# Patient Record
Sex: Female | Born: 1945
Health system: Southern US, Community
[De-identification: ages and names within clinical notes are randomized; demographics above are authoritative.]

## PROBLEM LIST (undated history)

## (undated) DIAGNOSIS — J309 Allergic rhinitis, unspecified: Secondary | ICD-10-CM

## (undated) DIAGNOSIS — K5792 Diverticulitis of intestine, part unspecified, without perforation or abscess without bleeding: Secondary | ICD-10-CM

## (undated) DIAGNOSIS — E785 Hyperlipidemia, unspecified: Secondary | ICD-10-CM

## (undated) DIAGNOSIS — D801 Nonfamilial hypogammaglobulinemia: Secondary | ICD-10-CM

## (undated) DIAGNOSIS — E669 Obesity, unspecified: Secondary | ICD-10-CM

## (undated) DIAGNOSIS — C9111 Chronic lymphocytic leukemia of B-cell type in remission: Secondary | ICD-10-CM

## (undated) DIAGNOSIS — K219 Gastro-esophageal reflux disease without esophagitis: Secondary | ICD-10-CM

## (undated) DIAGNOSIS — R768 Other specified abnormal immunological findings in serum: Secondary | ICD-10-CM

## (undated) DIAGNOSIS — C801 Malignant (primary) neoplasm, unspecified: Secondary | ICD-10-CM

## (undated) DIAGNOSIS — K625 Hemorrhage of anus and rectum: Secondary | ICD-10-CM

## (undated) DIAGNOSIS — D649 Anemia, unspecified: Secondary | ICD-10-CM

## (undated) DIAGNOSIS — K635 Polyp of colon: Secondary | ICD-10-CM

## (undated) DIAGNOSIS — H269 Unspecified cataract: Secondary | ICD-10-CM

## (undated) DIAGNOSIS — E079 Disorder of thyroid, unspecified: Secondary | ICD-10-CM

## (undated) DIAGNOSIS — I10 Essential (primary) hypertension: Secondary | ICD-10-CM

## (undated) DIAGNOSIS — C4490 Unspecified malignant neoplasm of skin, unspecified: Secondary | ICD-10-CM

## (undated) HISTORY — DX: Hyperlipidemia, unspecified: E78.5

## (undated) HISTORY — DX: Other specified abnormal immunological findings in serum: R76.8

## (undated) HISTORY — DX: Anemia, unspecified: D64.9

## (undated) HISTORY — DX: Allergic rhinitis, unspecified: J30.9

## (undated) HISTORY — PX: TONSILLECTOMY: SUR1361

## (undated) HISTORY — DX: Unspecified cataract: H26.9

## (undated) HISTORY — DX: Essential (primary) hypertension: I10

## (undated) HISTORY — PX: SIGMOIDOSCOPY: SUR1295

## (undated) HISTORY — DX: Malignant (primary) neoplasm, unspecified: C80.1

## (undated) HISTORY — DX: Gastro-esophageal reflux disease without esophagitis: K21.9

## (undated) HISTORY — DX: Obesity, unspecified: E66.9

## (undated) HISTORY — DX: Unspecified malignant neoplasm of skin, unspecified: C44.90

## (undated) HISTORY — DX: Disorder of thyroid, unspecified: E07.9

## (undated) HISTORY — DX: Chronic lymphocytic leukemia of B-cell type in remission: C91.11

## (undated) HISTORY — PX: BUNIONECTOMY: SHX129

## (undated) HISTORY — PX: SKIN BIOPSY: SHX1

## (undated) HISTORY — DX: Polyp of colon: K63.5

## (undated) HISTORY — DX: Diverticulitis of intestine, part unspecified, without perforation or abscess without bleeding: K57.92

## (undated) HISTORY — DX: Hemorrhage of anus and rectum: K62.5

## (undated) HISTORY — DX: Nonfamilial hypogammaglobulinemia: D80.1

---
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
## 1999-04-04 ENCOUNTER — Ambulatory Visit (HOSPITAL_COMMUNITY): Admission: RE | Admit: 1999-04-04 | Discharge: 1999-04-04 | Payer: Self-pay | Admitting: Internal Medicine

## 1999-04-04 ENCOUNTER — Encounter: Payer: Self-pay | Admitting: Internal Medicine

## 1999-06-20 ENCOUNTER — Ambulatory Visit (HOSPITAL_COMMUNITY): Admission: RE | Admit: 1999-06-20 | Discharge: 1999-06-20 | Payer: Self-pay | Admitting: Gastroenterology

## 2000-03-04 ENCOUNTER — Encounter: Payer: Self-pay | Admitting: Obstetrics and Gynecology

## 2000-03-04 ENCOUNTER — Encounter: Admission: RE | Admit: 2000-03-04 | Discharge: 2000-03-04 | Payer: Self-pay | Admitting: Obstetrics and Gynecology

## 2001-02-23 ENCOUNTER — Other Ambulatory Visit: Admission: RE | Admit: 2001-02-23 | Discharge: 2001-02-23 | Payer: Self-pay | Admitting: Oncology

## 2001-03-04 ENCOUNTER — Encounter (INDEPENDENT_AMBULATORY_CARE_PROVIDER_SITE_OTHER): Payer: Self-pay | Admitting: Specialist

## 2001-03-04 ENCOUNTER — Other Ambulatory Visit: Admission: RE | Admit: 2001-03-04 | Discharge: 2001-03-04 | Payer: Self-pay | Admitting: Oncology

## 2001-04-08 ENCOUNTER — Encounter: Payer: Self-pay | Admitting: Obstetrics and Gynecology

## 2001-04-08 ENCOUNTER — Encounter: Admission: RE | Admit: 2001-04-08 | Discharge: 2001-04-08 | Payer: Self-pay | Admitting: Obstetrics and Gynecology

## 2002-06-08 ENCOUNTER — Encounter (HOSPITAL_COMMUNITY): Admission: RE | Admit: 2002-06-08 | Discharge: 2002-09-06 | Payer: Self-pay | Admitting: Internal Medicine

## 2002-07-04 ENCOUNTER — Encounter: Payer: Self-pay | Admitting: Oncology

## 2002-07-04 ENCOUNTER — Ambulatory Visit (HOSPITAL_COMMUNITY): Admission: RE | Admit: 2002-07-04 | Discharge: 2002-07-04 | Payer: Self-pay | Admitting: Oncology

## 2002-09-05 ENCOUNTER — Encounter: Payer: Self-pay | Admitting: Oncology

## 2002-09-05 ENCOUNTER — Ambulatory Visit (HOSPITAL_COMMUNITY): Admission: RE | Admit: 2002-09-05 | Discharge: 2002-09-05 | Payer: Self-pay | Admitting: Oncology

## 2002-09-14 ENCOUNTER — Encounter (HOSPITAL_COMMUNITY): Admission: RE | Admit: 2002-09-14 | Discharge: 2002-12-13 | Payer: Self-pay | Admitting: Oncology

## 2002-12-14 ENCOUNTER — Encounter (HOSPITAL_COMMUNITY): Admission: RE | Admit: 2002-12-14 | Discharge: 2003-02-09 | Payer: Self-pay | Admitting: Oncology

## 2003-01-04 ENCOUNTER — Encounter: Payer: Self-pay | Admitting: Oncology

## 2003-01-04 ENCOUNTER — Encounter: Admission: RE | Admit: 2003-01-04 | Discharge: 2003-01-04 | Payer: Self-pay | Admitting: Oncology

## 2004-02-01 ENCOUNTER — Encounter: Admission: RE | Admit: 2004-02-01 | Discharge: 2004-02-01 | Payer: Self-pay | Admitting: Oncology

## 2004-06-25 ENCOUNTER — Ambulatory Visit: Payer: Self-pay | Admitting: Oncology

## 2004-08-27 ENCOUNTER — Ambulatory Visit: Payer: Self-pay | Admitting: Oncology

## 2004-10-18 ENCOUNTER — Ambulatory Visit: Payer: Self-pay | Admitting: Oncology

## 2004-12-24 ENCOUNTER — Ambulatory Visit: Payer: Self-pay | Admitting: Oncology

## 2005-03-04 ENCOUNTER — Ambulatory Visit: Payer: Self-pay | Admitting: Oncology

## 2005-03-11 ENCOUNTER — Other Ambulatory Visit: Admission: RE | Admit: 2005-03-11 | Discharge: 2005-03-11 | Payer: Self-pay | Admitting: Oncology

## 2005-03-26 ENCOUNTER — Encounter: Admission: RE | Admit: 2005-03-26 | Discharge: 2005-03-26 | Payer: Self-pay | Admitting: Obstetrics and Gynecology

## 2005-06-17 ENCOUNTER — Ambulatory Visit: Payer: Self-pay | Admitting: Oncology

## 2005-07-22 ENCOUNTER — Encounter: Payer: Self-pay | Admitting: Internal Medicine

## 2005-08-27 ENCOUNTER — Ambulatory Visit: Payer: Self-pay | Admitting: Oncology

## 2005-11-11 ENCOUNTER — Ambulatory Visit: Payer: Self-pay | Admitting: Oncology

## 2006-01-05 ENCOUNTER — Ambulatory Visit: Payer: Self-pay | Admitting: Oncology

## 2006-01-07 LAB — MORPHOLOGY: PLT EST: ADEQUATE

## 2006-01-07 LAB — CBC WITH DIFFERENTIAL/PLATELET
BASO%: 0.2 % (ref 0.0–2.0)
Eosinophils Absolute: 0.1 10*3/uL (ref 0.0–0.5)
HCT: 41.8 % (ref 34.8–46.6)
LYMPH%: 27.9 % (ref 14.0–48.0)
MCHC: 34.4 g/dL (ref 32.0–36.0)
MONO#: 0.4 10*3/uL (ref 0.1–0.9)
NEUT#: 2.2 10*3/uL (ref 1.5–6.5)
Platelets: 176 10*3/uL (ref 145–400)
RBC: 4.59 10*6/uL (ref 3.70–5.32)
WBC: 3.6 10*3/uL — ABNORMAL LOW (ref 3.9–10.0)
lymph#: 1 10*3/uL (ref 0.9–3.3)

## 2006-01-07 LAB — COMPREHENSIVE METABOLIC PANEL
ALT: 51 U/L — ABNORMAL HIGH (ref 0–40)
AST: 32 U/L (ref 0–37)
Alkaline Phosphatase: 50 U/L (ref 39–117)
CO2: 28 mEq/L (ref 19–32)
Creatinine, Ser: 1.2 mg/dL (ref 0.4–1.2)
Sodium: 142 mEq/L (ref 135–145)
Total Bilirubin: 0.6 mg/dL (ref 0.3–1.2)
Total Protein: 6.1 g/dL (ref 6.0–8.3)

## 2006-03-04 ENCOUNTER — Ambulatory Visit: Payer: Self-pay | Admitting: Oncology

## 2006-03-10 LAB — CBC WITH DIFFERENTIAL/PLATELET
BASO%: 0.3 % (ref 0.0–2.0)
Eosinophils Absolute: 0.1 10*3/uL (ref 0.0–0.5)
MCHC: 34.5 g/dL (ref 32.0–36.0)
MONO#: 0.3 10*3/uL (ref 0.1–0.9)
MONO%: 8.4 % (ref 0.0–13.0)
NEUT#: 2.3 10*3/uL (ref 1.5–6.5)
RBC: 4.64 10*6/uL (ref 3.70–5.32)
RDW: 13.8 % (ref 11.3–14.5)
WBC: 3.7 10*3/uL — ABNORMAL LOW (ref 3.9–10.0)

## 2006-04-13 ENCOUNTER — Ambulatory Visit: Payer: Self-pay | Admitting: Oncology

## 2006-04-20 ENCOUNTER — Encounter: Payer: Self-pay | Admitting: Internal Medicine

## 2006-04-20 ENCOUNTER — Other Ambulatory Visit: Admission: RE | Admit: 2006-04-20 | Discharge: 2006-04-20 | Payer: Self-pay | Admitting: Oncology

## 2006-04-20 ENCOUNTER — Encounter: Payer: Self-pay | Admitting: Oncology

## 2006-04-20 LAB — CBC WITH DIFFERENTIAL/PLATELET
Basophils Absolute: 0 10*3/uL (ref 0.0–0.1)
EOS%: 1 % (ref 0.0–7.0)
LYMPH%: 17.8 % (ref 14.0–48.0)
MCH: 31.7 pg (ref 26.0–34.0)
MONO%: 8.7 % (ref 0.0–13.0)
NEUT#: 3.6 10*3/uL (ref 1.5–6.5)
NEUT%: 72.3 % (ref 39.6–76.8)
Platelets: 190 10*3/uL (ref 145–400)
WBC: 5 10*3/uL (ref 3.9–10.0)

## 2006-04-21 ENCOUNTER — Encounter: Admission: RE | Admit: 2006-04-21 | Discharge: 2006-04-21 | Payer: Self-pay | Admitting: Obstetrics and Gynecology

## 2006-04-21 LAB — COMPREHENSIVE METABOLIC PANEL
BUN: 19 mg/dL (ref 6–23)
CO2: 26 mEq/L (ref 19–32)
Calcium: 9.7 mg/dL (ref 8.4–10.5)
Creatinine, Ser: 1.26 mg/dL — ABNORMAL HIGH (ref 0.40–1.20)
Glucose, Bld: 94 mg/dL (ref 70–99)
Total Bilirubin: 0.7 mg/dL (ref 0.3–1.2)

## 2006-04-21 LAB — BETA 2 MICROGLOBULIN, SERUM: Beta-2 Microglobulin: 1.78 mg/L — ABNORMAL HIGH (ref 1.01–1.73)

## 2006-04-21 LAB — LACTATE DEHYDROGENASE: LDH: 238 U/L (ref 94–250)

## 2006-04-23 LAB — FLOW CYTOMETRY

## 2006-06-29 ENCOUNTER — Ambulatory Visit: Payer: Self-pay | Admitting: Oncology

## 2006-07-01 LAB — CBC WITH DIFFERENTIAL/PLATELET
BASO%: 0.2 % (ref 0.0–2.0)
Eosinophils Absolute: 0.1 10*3/uL (ref 0.0–0.5)
LYMPH%: 27 % (ref 14.0–48.0)
MONO#: 0.6 10*3/uL (ref 0.1–0.9)
NEUT#: 3.5 10*3/uL (ref 1.5–6.5)
Platelets: 200 10*3/uL (ref 145–400)
RBC: 4.69 10*6/uL (ref 3.70–5.32)
WBC: 5.6 10*3/uL (ref 3.9–10.0)
lymph#: 1.5 10*3/uL (ref 0.9–3.3)

## 2006-07-01 LAB — MORPHOLOGY
PLT EST: ADEQUATE
RBC Comments: NORMAL

## 2006-08-31 ENCOUNTER — Ambulatory Visit: Payer: Self-pay | Admitting: Oncology

## 2006-09-02 LAB — CBC WITH DIFFERENTIAL/PLATELET
BASO%: 0.3 % (ref 0.0–2.0)
EOS%: 1.3 % (ref 0.0–7.0)
Eosinophils Absolute: 0.1 10*3/uL (ref 0.0–0.5)
MCV: 91.7 fL (ref 81.0–101.0)
MONO%: 10.4 % (ref 0.0–13.0)
NEUT#: 2.9 10*3/uL (ref 1.5–6.5)
RBC: 4.72 10*6/uL (ref 3.70–5.32)
RDW: 13.2 % (ref 11.3–14.5)

## 2006-09-02 LAB — COMPREHENSIVE METABOLIC PANEL
AST: 51 U/L — ABNORMAL HIGH (ref 0–37)
Alkaline Phosphatase: 49 U/L (ref 39–117)
BUN: 18 mg/dL (ref 6–23)
Creatinine, Ser: 1.25 mg/dL — ABNORMAL HIGH (ref 0.40–1.20)
Potassium: 4 mEq/L (ref 3.5–5.3)
Total Bilirubin: 0.7 mg/dL (ref 0.3–1.2)

## 2006-09-02 LAB — MORPHOLOGY

## 2006-12-07 ENCOUNTER — Ambulatory Visit: Payer: Self-pay | Admitting: Oncology

## 2006-12-09 LAB — CBC WITH DIFFERENTIAL/PLATELET
Basophils Absolute: 0 10*3/uL (ref 0.0–0.1)
Eosinophils Absolute: 0 10*3/uL (ref 0.0–0.5)
HCT: 42.3 % (ref 34.8–46.6)
HGB: 15 g/dL (ref 11.6–15.9)
LYMPH%: 27.4 % (ref 14.0–48.0)
MCV: 89.8 fL (ref 81.0–101.0)
MONO#: 0.3 10*3/uL (ref 0.1–0.9)
MONO%: 8.4 % (ref 0.0–13.0)
NEUT#: 2.6 10*3/uL (ref 1.5–6.5)
Platelets: 146 10*3/uL (ref 145–400)

## 2006-12-09 LAB — MORPHOLOGY: PLT EST: ADEQUATE

## 2007-03-01 ENCOUNTER — Ambulatory Visit: Payer: Self-pay | Admitting: Oncology

## 2007-03-03 ENCOUNTER — Encounter: Payer: Self-pay | Admitting: Oncology

## 2007-03-03 LAB — CBC WITH DIFFERENTIAL/PLATELET
Basophils Absolute: 0 10*3/uL (ref 0.0–0.1)
EOS%: 1.4 % (ref 0.0–7.0)
Eosinophils Absolute: 0.1 10*3/uL (ref 0.0–0.5)
HCT: 42.5 % (ref 34.8–46.6)
HGB: 14.9 g/dL (ref 11.6–15.9)
MCH: 32.1 pg (ref 26.0–34.0)
MCV: 91.1 fL (ref 81.0–101.0)
NEUT#: 3.1 10*3/uL (ref 1.5–6.5)
NEUT%: 65.5 % (ref 39.6–76.8)
RDW: 13.9 % (ref 11.3–14.5)
lymph#: 1.1 10*3/uL (ref 0.9–3.3)

## 2007-03-03 LAB — MORPHOLOGY: PLT EST: ADEQUATE

## 2007-03-03 LAB — COMPREHENSIVE METABOLIC PANEL
ALT: 33 U/L (ref 0–35)
AST: 26 U/L (ref 0–37)
Albumin: 4.5 g/dL (ref 3.5–5.2)
Alkaline Phosphatase: 52 U/L (ref 39–117)
BUN: 18 mg/dL (ref 6–23)
Calcium: 9.7 mg/dL (ref 8.4–10.5)
Chloride: 106 mEq/L (ref 96–112)
Potassium: 3.9 mEq/L (ref 3.5–5.3)
Sodium: 145 mEq/L (ref 135–145)
Total Protein: 6.5 g/dL (ref 6.0–8.3)

## 2007-03-04 ENCOUNTER — Other Ambulatory Visit: Admission: RE | Admit: 2007-03-04 | Discharge: 2007-03-04 | Payer: Self-pay | Admitting: Oncology

## 2007-03-05 LAB — FLOW CYTOMETRY

## 2007-04-27 ENCOUNTER — Encounter: Admission: RE | Admit: 2007-04-27 | Discharge: 2007-04-27 | Payer: Self-pay | Admitting: Obstetrics and Gynecology

## 2007-05-06 ENCOUNTER — Ambulatory Visit: Payer: Self-pay | Admitting: Oncology

## 2007-05-11 LAB — CBC WITH DIFFERENTIAL/PLATELET
BASO%: 0.8 % (ref 0.0–2.0)
Basophils Absolute: 0 10*3/uL (ref 0.0–0.1)
EOS%: 0.6 % (ref 0.0–7.0)
HGB: 15 g/dL (ref 11.6–15.9)
MCH: 32.3 pg (ref 26.0–34.0)
MCHC: 35.3 g/dL (ref 32.0–36.0)
MCV: 91.5 fL (ref 81.0–101.0)
MONO%: 7.9 % (ref 0.0–13.0)
NEUT%: 62.3 % (ref 39.6–76.8)
RDW: 13 % (ref 11.3–14.5)
lymph#: 1.6 10*3/uL (ref 0.9–3.3)

## 2007-05-11 LAB — MORPHOLOGY: PLT EST: ADEQUATE

## 2007-07-06 ENCOUNTER — Ambulatory Visit: Payer: Self-pay | Admitting: Oncology

## 2007-07-06 LAB — COMPREHENSIVE METABOLIC PANEL
AST: 32 U/L (ref 0–37)
Albumin: 4.9 g/dL (ref 3.5–5.2)
BUN: 21 mg/dL (ref 6–23)
CO2: 27 mEq/L (ref 19–32)
Calcium: 10.1 mg/dL (ref 8.4–10.5)
Chloride: 106 mEq/L (ref 96–112)
Creatinine, Ser: 1.15 mg/dL (ref 0.40–1.20)
Glucose, Bld: 103 mg/dL — ABNORMAL HIGH (ref 70–99)
Potassium: 4.4 mEq/L (ref 3.5–5.3)

## 2007-07-06 LAB — CBC WITH DIFFERENTIAL/PLATELET
BASO%: 0.2 % (ref 0.0–2.0)
Basophils Absolute: 0 10*3/uL (ref 0.0–0.1)
EOS%: 1.3 % (ref 0.0–7.0)
HCT: 43.4 % (ref 34.8–46.6)
HGB: 15.2 g/dL (ref 11.6–15.9)
MCH: 32 pg (ref 26.0–34.0)
MCHC: 35.1 g/dL (ref 32.0–36.0)
MCV: 91.1 fL (ref 81.0–101.0)
MONO%: 7.2 % (ref 0.0–13.0)
NEUT%: 68.7 % (ref 39.6–76.8)

## 2007-10-01 ENCOUNTER — Ambulatory Visit: Payer: Self-pay | Admitting: Oncology

## 2007-10-05 LAB — CBC WITH DIFFERENTIAL/PLATELET
Eosinophils Absolute: 0.1 10*3/uL (ref 0.0–0.5)
HCT: 41.6 % (ref 34.8–46.6)
LYMPH%: 29.2 % (ref 14.0–48.0)
MONO#: 0.4 10*3/uL (ref 0.1–0.9)
NEUT#: 2.5 10*3/uL (ref 1.5–6.5)
NEUT%: 58.9 % (ref 39.6–76.8)
Platelets: 163 10*3/uL (ref 145–400)
RBC: 4.82 10*6/uL (ref 3.70–5.32)
WBC: 4.3 10*3/uL (ref 3.9–10.0)
lymph#: 1.3 10*3/uL (ref 0.9–3.3)

## 2007-10-05 LAB — MORPHOLOGY
PLT EST: ADEQUATE
RBC Comments: NORMAL

## 2008-01-07 ENCOUNTER — Ambulatory Visit: Payer: Self-pay | Admitting: Oncology

## 2008-03-25 LAB — CONVERTED CEMR LAB

## 2008-04-07 ENCOUNTER — Ambulatory Visit: Payer: Self-pay | Admitting: Oncology

## 2008-04-11 LAB — CBC WITH DIFFERENTIAL/PLATELET
BASO%: 0.3 % (ref 0.0–2.0)
Eosinophils Absolute: 0.1 10*3/uL (ref 0.0–0.5)
HCT: 41.2 % (ref 34.8–46.6)
LYMPH%: 24.9 % (ref 14.0–48.0)
MCHC: 34.5 g/dL (ref 32.0–36.0)
MCV: 90.6 fL (ref 81.0–101.0)
MONO#: 0.5 10*3/uL (ref 0.1–0.9)
MONO%: 9 % (ref 0.0–13.0)
NEUT%: 64.2 % (ref 39.6–76.8)
Platelets: 166 10*3/uL (ref 145–400)
RBC: 4.55 10*6/uL (ref 3.70–5.32)

## 2008-04-11 LAB — MORPHOLOGY: PLT EST: ADEQUATE

## 2008-05-31 ENCOUNTER — Encounter: Admission: RE | Admit: 2008-05-31 | Discharge: 2008-05-31 | Payer: Self-pay | Admitting: Obstetrics and Gynecology

## 2008-07-06 ENCOUNTER — Ambulatory Visit: Payer: Self-pay | Admitting: Oncology

## 2008-07-10 LAB — COMPREHENSIVE METABOLIC PANEL
AST: 32 U/L (ref 0–37)
Albumin: 4.5 g/dL (ref 3.5–5.2)
Alkaline Phosphatase: 51 U/L (ref 39–117)
Potassium: 3.8 mEq/L (ref 3.5–5.3)
Sodium: 142 mEq/L (ref 135–145)
Total Bilirubin: 0.7 mg/dL (ref 0.3–1.2)
Total Protein: 6.3 g/dL (ref 6.0–8.3)

## 2008-07-10 LAB — CBC WITH DIFFERENTIAL/PLATELET
BASO%: 0.3 % (ref 0.0–2.0)
EOS%: 1.4 % (ref 0.0–7.0)
Eosinophils Absolute: 0.1 10*3/uL (ref 0.0–0.5)
LYMPH%: 26 % (ref 14.0–48.0)
MCH: 31.7 pg (ref 26.0–34.0)
MCHC: 34.6 g/dL (ref 32.0–36.0)
MCV: 91.8 fL (ref 81.0–101.0)
MONO%: 8.6 % (ref 0.0–13.0)
NEUT#: 2.9 10*3/uL (ref 1.5–6.5)
RBC: 4.5 10*6/uL (ref 3.70–5.32)
RDW: 13.1 % (ref 11.3–14.5)

## 2008-10-05 ENCOUNTER — Ambulatory Visit: Payer: Self-pay | Admitting: Oncology

## 2008-10-09 LAB — CBC WITH DIFFERENTIAL/PLATELET
Basophils Absolute: 0 10*3/uL (ref 0.0–0.1)
EOS%: 1.7 % (ref 0.0–7.0)
Eosinophils Absolute: 0.1 10*3/uL (ref 0.0–0.5)
HCT: 42.6 % (ref 34.8–46.6)
HGB: 14.8 g/dL (ref 11.6–15.9)
MCH: 31.6 pg (ref 26.0–34.0)
MCV: 91 fL (ref 81.0–101.0)
MONO%: 11.4 % (ref 0.0–13.0)
NEUT#: 2.6 10*3/uL (ref 1.5–6.5)
NEUT%: 57.3 % (ref 39.6–76.8)

## 2008-10-09 LAB — MORPHOLOGY: RBC Comments: NORMAL

## 2008-12-28 ENCOUNTER — Ambulatory Visit: Payer: Self-pay | Admitting: Oncology

## 2008-12-28 IMAGING — MG MM SCREEN MAMMOGRAM BILATERAL
4 series · 4 of 4 positions shown · non-contrast
Comparison: Prior studies.

DG SCREEN MAMMOGRAM BILATERAL
Bilateral CC and MLO view(s) were taken.
Prior study comparison: March 26, 2005, bilateral screening mammogram.

DIGITAL SCREENING MAMMOGRAM WITH CAD:

[R CC]
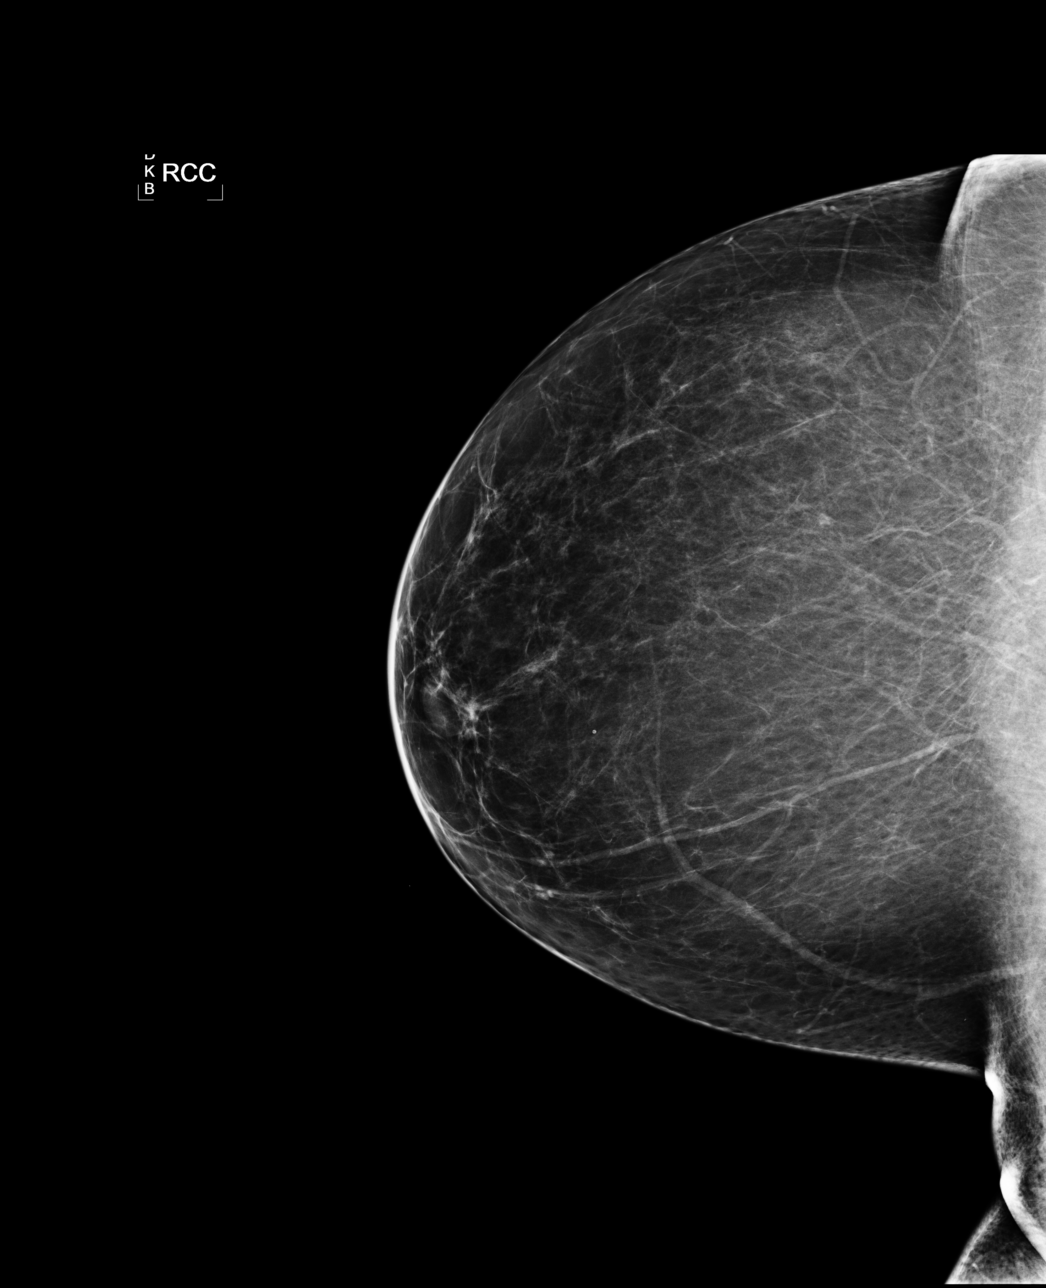

[L CC]
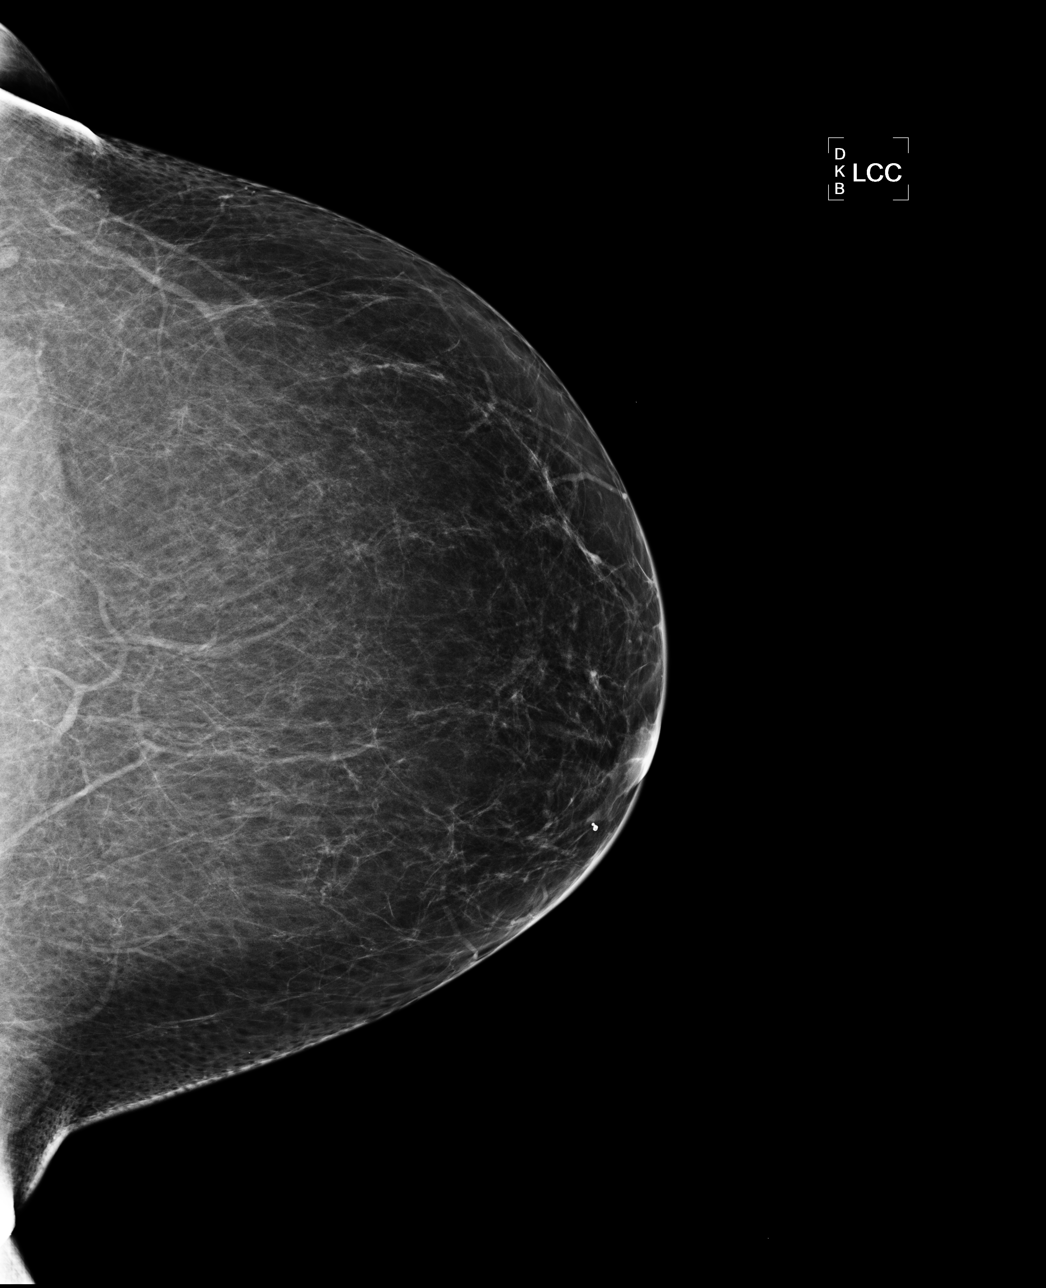

[L MLO]
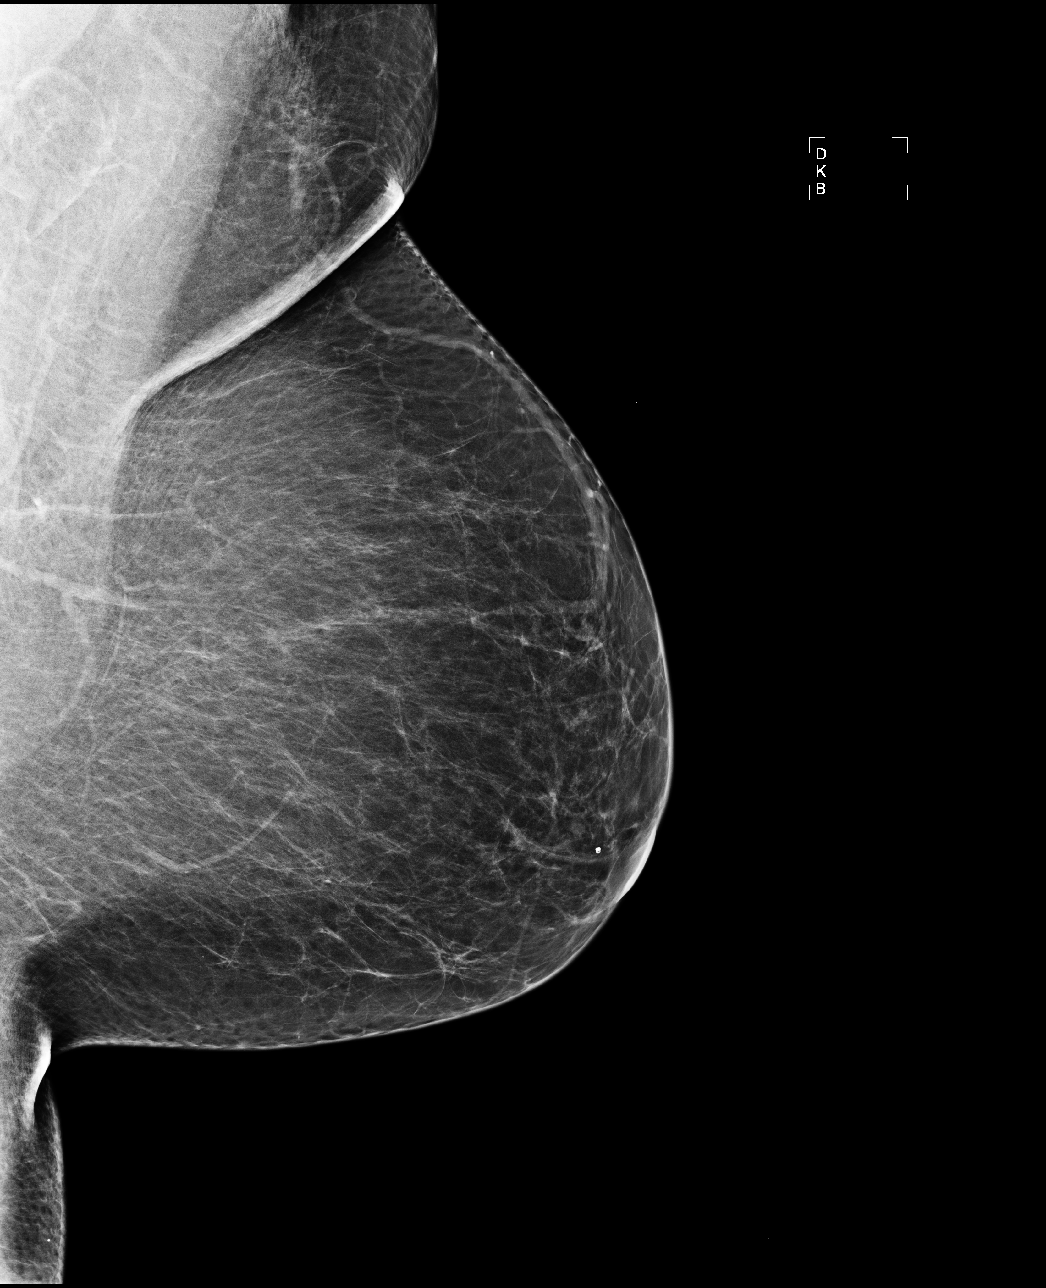

[R MLO]
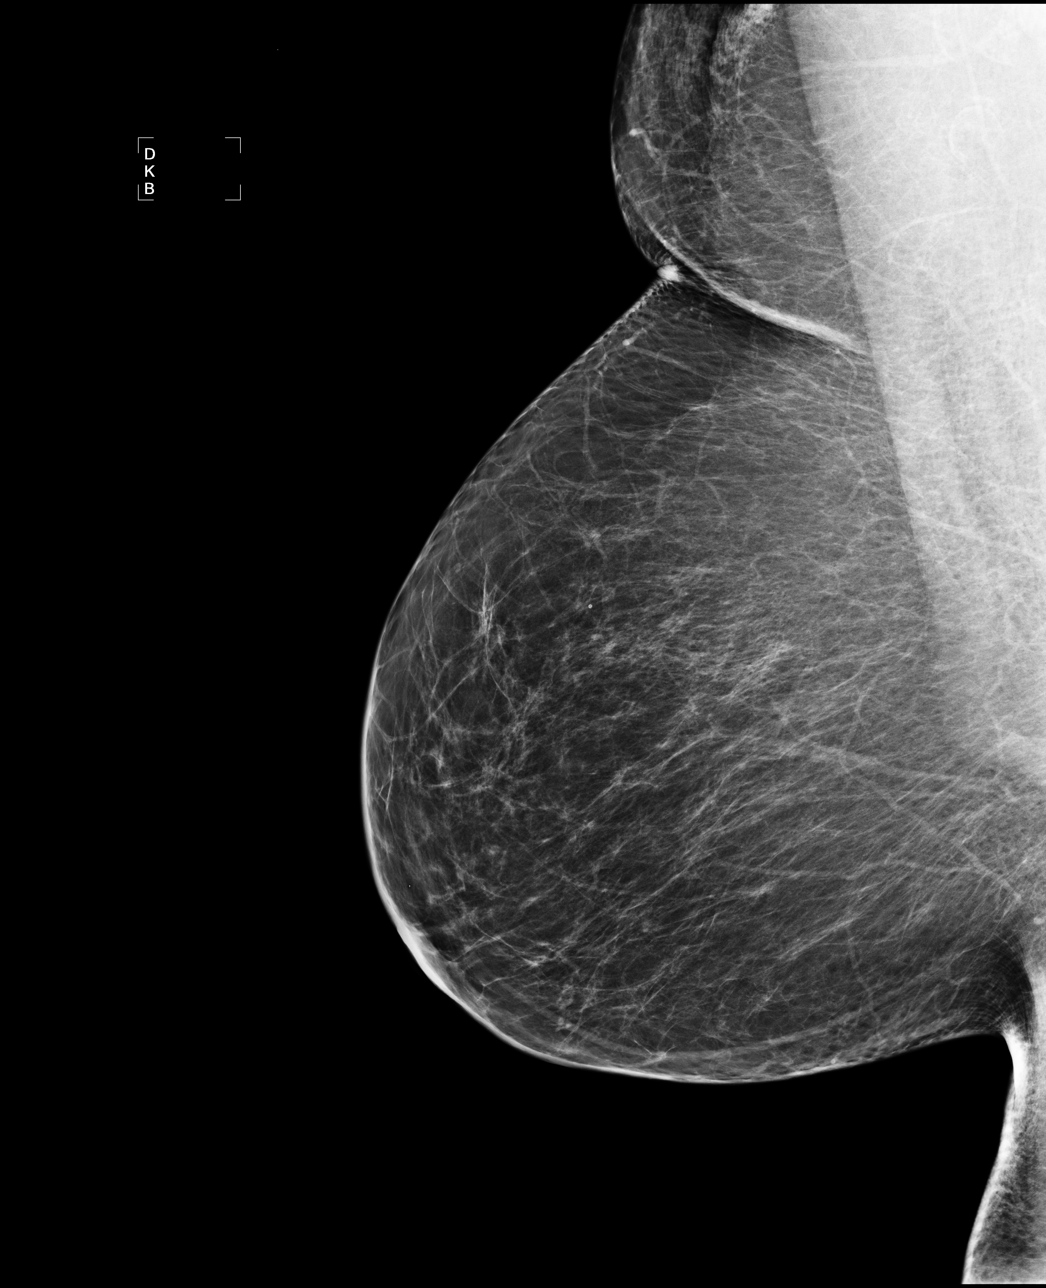

[4 of 4 positions shown; findings below may reference images not displayed]

The breast tissue is almost entirely fatty.  There is no dominant mass, architectural distortion or
calcification to suggest malignancy.
IMPRESSION: No mammographic evidence of malignancy.  Suggest yearly screening mammography.

ASSESSMENT: Negative - BI-RADS 1

Screening mammogram in 1 year.
ANALYZED BY COMPUTER AIDED DETECTION. , THIS PROCEDURE WAS A DIGITAL MAMMOGRAM.

## 2009-01-01 LAB — COMPREHENSIVE METABOLIC PANEL
ALT: 48 U/L — ABNORMAL HIGH (ref 0–35)
Albumin: 4 g/dL (ref 3.5–5.2)
CO2: 31 mEq/L (ref 19–32)
Calcium: 9.5 mg/dL (ref 8.4–10.5)
Chloride: 109 mEq/L (ref 96–112)
Glucose, Bld: 98 mg/dL (ref 70–99)
Potassium: 3.8 mEq/L (ref 3.5–5.3)
Sodium: 147 mEq/L — ABNORMAL HIGH (ref 135–145)
Total Protein: 6 g/dL (ref 6.0–8.3)

## 2009-01-01 LAB — CBC WITH DIFFERENTIAL/PLATELET
Basophils Absolute: 0 10*3/uL (ref 0.0–0.1)
Eosinophils Absolute: 0.1 10*3/uL (ref 0.0–0.5)
HCT: 41.3 % (ref 34.8–46.6)
LYMPH%: 26.7 % (ref 14.0–49.7)
MCV: 90.6 fL (ref 79.5–101.0)
MONO%: 9 % (ref 0.0–14.0)
NEUT#: 3 10*3/uL (ref 1.5–6.5)
NEUT%: 62.3 % (ref 38.4–76.8)
Platelets: 168 10*3/uL (ref 145–400)
RBC: 4.56 10*6/uL (ref 3.70–5.45)

## 2009-01-01 LAB — MORPHOLOGY: PLT EST: ADEQUATE

## 2009-01-01 LAB — LACTATE DEHYDROGENASE: LDH: 214 U/L (ref 94–250)

## 2009-01-03 LAB — IMMUNOFIXATION ELECTROPHORESIS: Total Protein, Serum Electrophoresis: 6.4 g/dL (ref 6.0–8.3)

## 2009-01-08 ENCOUNTER — Encounter: Payer: Self-pay | Admitting: Internal Medicine

## 2009-03-29 ENCOUNTER — Encounter: Payer: Self-pay | Admitting: Internal Medicine

## 2009-04-09 ENCOUNTER — Encounter: Admission: RE | Admit: 2009-04-09 | Discharge: 2009-04-09 | Payer: Self-pay | Admitting: Orthopedic Surgery

## 2009-04-13 ENCOUNTER — Emergency Department (HOSPITAL_COMMUNITY): Admission: EM | Admit: 2009-04-13 | Discharge: 2009-04-13 | Payer: Self-pay | Admitting: Emergency Medicine

## 2009-04-18 ENCOUNTER — Emergency Department (HOSPITAL_COMMUNITY): Admission: EM | Admit: 2009-04-18 | Discharge: 2009-04-18 | Payer: Self-pay | Admitting: Emergency Medicine

## 2009-04-18 LAB — CONVERTED CEMR LAB
AST: 35 units/L
BUN: 12 mg/dL
Basophils Relative: 0 %
Calcium: 9.7 mg/dL
Creatinine, Ser: 0.99 mg/dL
Lymphocytes, automated: 18 %
MCV: 91.6 fL
Monocytes Relative: 12 %
Neutrophils Relative %: 68 %
Potassium: 3.9 meq/L
RDW: 13 %
Sodium: 139 meq/L
Total Protein: 6.6 g/dL

## 2009-04-23 ENCOUNTER — Encounter: Payer: Self-pay | Admitting: Internal Medicine

## 2009-06-07 ENCOUNTER — Encounter: Admission: RE | Admit: 2009-06-07 | Discharge: 2009-06-07 | Payer: Self-pay | Admitting: Obstetrics and Gynecology

## 2009-06-27 ENCOUNTER — Ambulatory Visit: Payer: Self-pay | Admitting: Oncology

## 2009-06-29 LAB — CBC WITH DIFFERENTIAL/PLATELET
Eosinophils Absolute: 0.1 10*3/uL (ref 0.0–0.5)
MONO#: 0.5 10*3/uL (ref 0.1–0.9)
MONO%: 8.6 % (ref 0.0–14.0)
NEUT#: 3.9 10*3/uL (ref 1.5–6.5)
RBC: 4.51 10*6/uL (ref 3.70–5.45)
RDW: 13.6 % (ref 11.2–14.5)
WBC: 5.9 10*3/uL (ref 3.9–10.3)

## 2009-06-29 LAB — COMPREHENSIVE METABOLIC PANEL
ALT: 61 U/L — ABNORMAL HIGH (ref 0–35)
Albumin: 4.3 g/dL (ref 3.5–5.2)
Alkaline Phosphatase: 50 U/L (ref 39–117)
CO2: 28 mEq/L (ref 19–32)
Glucose, Bld: 107 mg/dL — ABNORMAL HIGH (ref 70–99)
Potassium: 3.9 mEq/L (ref 3.5–5.3)
Sodium: 140 mEq/L (ref 135–145)
Total Protein: 6.1 g/dL (ref 6.0–8.3)

## 2009-06-29 LAB — IGG, IGA, IGM: IgG (Immunoglobin G), Serum: 227 mg/dL — ABNORMAL LOW (ref 694–1618)

## 2009-07-09 ENCOUNTER — Encounter: Payer: Self-pay | Admitting: Internal Medicine

## 2009-09-07 ENCOUNTER — Ambulatory Visit: Payer: Self-pay | Admitting: Oncology

## 2009-09-11 LAB — CBC WITH DIFFERENTIAL/PLATELET
BASO%: 0.2 % (ref 0.0–2.0)
Eosinophils Absolute: 0.1 10*3/uL (ref 0.0–0.5)
HCT: 42.2 % (ref 34.8–46.6)
HGB: 14.2 g/dL (ref 11.6–15.9)
LYMPH%: 30.5 % (ref 14.0–49.7)
MCH: 31.3 pg (ref 25.1–34.0)
MCHC: 33.6 g/dL (ref 31.5–36.0)
MONO#: 0.5 10*3/uL (ref 0.1–0.9)
MONO%: 8.9 % (ref 0.0–14.0)
NEUT#: 3 10*3/uL (ref 1.5–6.5)
RDW: 13.8 % (ref 11.2–14.5)

## 2009-09-11 LAB — COMPREHENSIVE METABOLIC PANEL
Glucose, Bld: 104 mg/dL — ABNORMAL HIGH (ref 70–99)
Total Bilirubin: 0.4 mg/dL (ref 0.3–1.2)

## 2009-09-11 LAB — MORPHOLOGY: PLT EST: ADEQUATE

## 2009-11-02 ENCOUNTER — Ambulatory Visit: Payer: Self-pay | Admitting: Oncology

## 2009-11-06 LAB — MORPHOLOGY
PLT EST: ADEQUATE
RBC Comments: NORMAL

## 2009-11-06 LAB — CBC WITH DIFFERENTIAL/PLATELET
Basophils Absolute: 0 10*3/uL (ref 0.0–0.1)
EOS%: 2.8 % (ref 0.0–7.0)
HCT: 41.9 % (ref 34.8–46.6)
LYMPH%: 27.9 % (ref 14.0–49.7)
MCH: 31.2 pg (ref 25.1–34.0)
MCHC: 34.4 g/dL (ref 31.5–36.0)
MONO#: 0.6 10*3/uL (ref 0.1–0.9)
MONO%: 8.8 % (ref 0.0–14.0)
NEUT%: 60.2 % (ref 38.4–76.8)
Platelets: 168 10*3/uL (ref 145–400)
WBC: 6.5 10*3/uL (ref 3.9–10.3)

## 2009-11-06 LAB — BASIC METABOLIC PANEL
BUN: 13 mg/dL (ref 6–23)
Creatinine, Ser: 1.08 mg/dL (ref 0.40–1.20)
Potassium: 3.9 mEq/L (ref 3.5–5.3)

## 2010-01-10 ENCOUNTER — Ambulatory Visit: Payer: Self-pay | Admitting: Oncology

## 2010-01-11 ENCOUNTER — Encounter: Payer: Self-pay | Admitting: Internal Medicine

## 2010-01-11 LAB — CBC WITH DIFFERENTIAL/PLATELET
Basophils Absolute: 0 10*3/uL (ref 0.0–0.1)
Eosinophils Absolute: 0.1 10*3/uL (ref 0.0–0.5)
HCT: 42.4 % (ref 34.8–46.6)
LYMPH%: 35.1 % (ref 14.0–49.7)
MONO#: 0.4 10*3/uL (ref 0.1–0.9)
NEUT#: 2.9 10*3/uL (ref 1.5–6.5)
NEUT%: 56.1 % (ref 38.4–76.8)
Platelets: 186 10*3/uL (ref 145–400)
WBC: 5.2 10*3/uL (ref 3.9–10.3)

## 2010-01-11 LAB — BASIC METABOLIC PANEL
CO2: 22 mEq/L (ref 19–32)
Glucose, Bld: 128 mg/dL — ABNORMAL HIGH (ref 70–99)
Potassium: 3.9 mEq/L (ref 3.5–5.3)
Sodium: 145 mEq/L (ref 135–145)

## 2010-02-04 ENCOUNTER — Encounter: Payer: Self-pay | Admitting: Internal Medicine

## 2010-02-04 ENCOUNTER — Ambulatory Visit (HOSPITAL_COMMUNITY): Admission: RE | Admit: 2010-02-04 | Discharge: 2010-02-04 | Payer: Self-pay | Admitting: Oncology

## 2010-02-04 LAB — CBC WITH DIFFERENTIAL/PLATELET
Basophils Absolute: 0 10*3/uL (ref 0.0–0.1)
EOS%: 0.2 % (ref 0.0–7.0)
HGB: 15 g/dL (ref 11.6–15.9)
MCH: 29.6 pg (ref 25.1–34.0)
MCV: 88.3 fL (ref 79.5–101.0)
MONO%: 7.4 % (ref 0.0–14.0)
NEUT%: 73.6 % (ref 38.4–76.8)
RDW: 13.9 % (ref 11.2–14.5)

## 2010-02-07 LAB — IMMUNOFIXATION ELECTROPHORESIS: IgG (Immunoglobin G), Serum: 196 mg/dL — ABNORMAL LOW (ref 694–1618)

## 2010-02-20 ENCOUNTER — Ambulatory Visit: Payer: Self-pay | Admitting: Internal Medicine

## 2010-02-20 DIAGNOSIS — J302 Other seasonal allergic rhinitis: Secondary | ICD-10-CM | POA: Insufficient documentation

## 2010-02-20 DIAGNOSIS — J3089 Other allergic rhinitis: Secondary | ICD-10-CM

## 2010-02-20 DIAGNOSIS — D649 Anemia, unspecified: Secondary | ICD-10-CM | POA: Insufficient documentation

## 2010-02-20 DIAGNOSIS — Z85828 Personal history of other malignant neoplasm of skin: Secondary | ICD-10-CM | POA: Insufficient documentation

## 2010-02-20 DIAGNOSIS — Z856 Personal history of leukemia: Secondary | ICD-10-CM | POA: Insufficient documentation

## 2010-02-20 DIAGNOSIS — C9111 Chronic lymphocytic leukemia of B-cell type in remission: Secondary | ICD-10-CM

## 2010-02-26 ENCOUNTER — Encounter: Payer: Self-pay | Admitting: Internal Medicine

## 2010-02-27 ENCOUNTER — Telehealth (INDEPENDENT_AMBULATORY_CARE_PROVIDER_SITE_OTHER): Payer: Self-pay | Admitting: *Deleted

## 2010-02-28 ENCOUNTER — Ambulatory Visit: Payer: Self-pay | Admitting: Oncology

## 2010-03-04 ENCOUNTER — Encounter: Payer: Self-pay | Admitting: Internal Medicine

## 2010-03-04 LAB — CONVERTED CEMR LAB
BUN: 15 mg/dL
Calcium: 9.7 mg/dL
Hemoglobin: 13.8 g/dL
Platelets: 169 10*3/uL
Potassium: 3.9 meq/L
WBC: 5 10*3/uL

## 2010-03-04 LAB — CBC WITH DIFFERENTIAL/PLATELET
EOS%: 1.8 % (ref 0.0–7.0)
Eosinophils Absolute: 0.1 10*3/uL (ref 0.0–0.5)
HGB: 13.8 g/dL (ref 11.6–15.9)
MCV: 89 fL (ref 79.5–101.0)
MONO#: 0.5 10*3/uL (ref 0.1–0.9)
MONO%: 9.7 % (ref 0.0–14.0)
Platelets: 169 10*3/uL (ref 145–400)
RBC: 4.48 10*6/uL (ref 3.70–5.45)
RDW: 14.7 % — ABNORMAL HIGH (ref 11.2–14.5)
WBC: 5 10*3/uL (ref 3.9–10.3)

## 2010-03-04 LAB — BASIC METABOLIC PANEL
CO2: 25 mEq/L (ref 19–32)
Chloride: 109 mEq/L (ref 96–112)
Creatinine, Ser: 1.03 mg/dL (ref 0.40–1.20)
Glucose, Bld: 107 mg/dL — ABNORMAL HIGH (ref 70–99)

## 2010-03-04 LAB — MORPHOLOGY: RBC Comments: NORMAL

## 2010-03-05 ENCOUNTER — Encounter: Payer: Self-pay | Admitting: Internal Medicine

## 2010-05-06 ENCOUNTER — Telehealth: Payer: Self-pay | Admitting: Internal Medicine

## 2010-05-14 ENCOUNTER — Ambulatory Visit: Payer: Self-pay | Admitting: Oncology

## 2010-05-17 ENCOUNTER — Encounter: Payer: Self-pay | Admitting: Internal Medicine

## 2010-05-17 LAB — CBC WITH DIFFERENTIAL/PLATELET
BASO%: 0.3 % (ref 0.0–2.0)
Basophils Absolute: 0 10*3/uL (ref 0.0–0.1)
EOS%: 1.5 % (ref 0.0–7.0)
Eosinophils Absolute: 0.1 10*3/uL (ref 0.0–0.5)
HGB: 14.6 g/dL (ref 11.6–15.9)
MCH: 30.9 pg (ref 25.1–34.0)
MCHC: 33.8 g/dL (ref 31.5–36.0)
MCV: 91.3 fL (ref 79.5–101.0)
MONO#: 0.5 10*3/uL (ref 0.1–0.9)
MONO%: 8.3 % (ref 0.0–14.0)
NEUT#: 3.1 10*3/uL (ref 1.5–6.5)
NEUT%: 56.1 % (ref 38.4–76.8)
WBC: 5.4 10*3/uL (ref 3.9–10.3)

## 2010-05-17 LAB — MORPHOLOGY: PLT EST: ADEQUATE

## 2010-05-17 LAB — CONVERTED CEMR LAB
Hemoglobin: 14.6 g/dL
Platelets: 169 10*3/uL

## 2010-05-17 LAB — BASIC METABOLIC PANEL
BUN: 18 mg/dL (ref 6–23)
Creatinine, Ser: 1.19 mg/dL (ref 0.40–1.20)
Glucose, Bld: 110 mg/dL — ABNORMAL HIGH (ref 70–99)

## 2010-05-20 ENCOUNTER — Ambulatory Visit: Payer: Self-pay | Admitting: Internal Medicine

## 2010-05-20 DIAGNOSIS — R509 Fever, unspecified: Secondary | ICD-10-CM | POA: Insufficient documentation

## 2010-05-20 LAB — CONVERTED CEMR LAB
Hemoglobin, Urine: NEGATIVE
Ketones, ur: NEGATIVE mg/dL
Nitrite: NEGATIVE
Total Protein, Urine: NEGATIVE mg/dL
Urobilinogen, UA: 0.2 (ref 0.0–1.0)
pH: 6 (ref 5.0–8.0)

## 2010-05-23 ENCOUNTER — Telehealth: Payer: Self-pay | Admitting: Internal Medicine

## 2010-05-23 ENCOUNTER — Ambulatory Visit: Payer: Self-pay | Admitting: Internal Medicine

## 2010-05-23 DIAGNOSIS — L02419 Cutaneous abscess of limb, unspecified: Secondary | ICD-10-CM

## 2010-05-23 DIAGNOSIS — L03119 Cellulitis of unspecified part of limb: Secondary | ICD-10-CM

## 2010-06-13 ENCOUNTER — Encounter: Admission: RE | Admit: 2010-06-13 | Discharge: 2010-06-13 | Payer: Self-pay | Admitting: Oncology

## 2010-06-13 LAB — HM MAMMOGRAPHY: HM Mammogram: NEGATIVE

## 2010-07-05 ENCOUNTER — Ambulatory Visit: Payer: Self-pay | Admitting: Oncology

## 2010-07-09 ENCOUNTER — Encounter: Payer: Self-pay | Admitting: Internal Medicine

## 2010-07-09 LAB — COMPREHENSIVE METABOLIC PANEL
ALT: 62 U/L — ABNORMAL HIGH (ref 0–35)
BUN: 14 mg/dL (ref 6–23)
CO2: 23 mEq/L (ref 19–32)
Calcium: 9.2 mg/dL (ref 8.4–10.5)
Chloride: 105 mEq/L (ref 96–112)
Creatinine, Ser: 1.04 mg/dL (ref 0.40–1.20)
Glucose, Bld: 110 mg/dL — ABNORMAL HIGH (ref 70–99)
Total Bilirubin: 1 mg/dL (ref 0.3–1.2)

## 2010-07-09 LAB — CBC WITH DIFFERENTIAL/PLATELET
Eosinophils Absolute: 0.2 10*3/uL (ref 0.0–0.5)
HGB: 15 g/dL (ref 11.6–15.9)
MONO#: 0.5 10*3/uL (ref 0.1–0.9)
NEUT#: 4.9 10*3/uL (ref 1.5–6.5)
RBC: 4.75 10*6/uL (ref 3.70–5.45)
RDW: 13.2 % (ref 11.2–14.5)
WBC: 6.8 10*3/uL (ref 3.9–10.3)

## 2010-07-09 LAB — LACTATE DEHYDROGENASE: LDH: 195 U/L (ref 94–250)

## 2010-07-12 ENCOUNTER — Telehealth: Payer: Self-pay | Admitting: Internal Medicine

## 2010-07-15 ENCOUNTER — Ambulatory Visit: Payer: Self-pay | Admitting: Internal Medicine

## 2010-07-15 DIAGNOSIS — J209 Acute bronchitis, unspecified: Secondary | ICD-10-CM

## 2010-07-25 ENCOUNTER — Ambulatory Visit: Payer: Self-pay | Admitting: Internal Medicine

## 2010-07-25 DIAGNOSIS — H6690 Otitis media, unspecified, unspecified ear: Secondary | ICD-10-CM | POA: Insufficient documentation

## 2010-09-10 ENCOUNTER — Telehealth: Payer: Self-pay | Admitting: Internal Medicine

## 2010-09-22 LAB — CONVERTED CEMR LAB
CO2: 22 meq/L
Calcium: 9.5 mg/dL
Chloride: 107 meq/L
Glucose, Bld: 128 mg/dL
Hemoglobin: 14.6 g/dL
Hemoglobin: 15 g/dL
Potassium: 3.9 meq/L
Sodium: 145 meq/L
WBC: 11.9 10*3/uL
WBC: 11.9 10*3/uL

## 2010-09-26 NOTE — Progress Notes (Signed)
Summary: Rx refill req  Phone Note Refill Request Message from:  Patient on May 06, 2010 11:17 AM  Refills Requested: Medication #1:  OMNARIS 50 MCG/ACT SUSP take 1 by mouth once daily   Dosage confirmed as above?Dosage Confirmed   Supply Requested: 6 months [facility]   Method Requested: Electronic Initial call taken by: Margaret Pyle, CMA,  May 06, 2010 11:17 AM    Prescriptions: OMNARIS 50 MCG/ACT SUSP (CICLESONIDE) take 1 by mouth once daily  #1 x 5   Entered by:   Margaret Pyle, CMA   Authorized by:   Newt Lukes MD   Signed by:   Margaret Pyle, CMA on 05/06/2010   Method used:   Electronically to        [facility]. [phone]* (retail)       [address]       [place], [place]  [address]       Ph: [phone]       Fax: [phone]   RxID:   [id number]

## 2010-09-26 NOTE — Assessment & Plan Note (Signed)
Summary: f/u pneumonia/#/cd   Vital Signs:  Patient profile:   65 year old female Height:      64.5 inches (163.83 cm) Weight:      214.6 pounds (97.55 kg) O2 Sat:      95 % on Room air Temp:     97.2 degrees F (36.22 degrees C) oral Pulse rate:   104 / minute BP sitting:   110 / 80  (left arm) Cuff size:   large  Vitals Entered By: Orlan Leavens RMA (July 25, 2010 9:18 AM)  O2 Flow:  Room air CC: follow-up visit Is Patient Diabetic? No Pain Assessment Patient in pain? no      Comments Pt states (R) ear feel stuffy, hard to hear   Primary Care Provider:  Newt Lukes MD  CC:  follow-up visit.  History of Present Illness:  here with f/u visit; LLL PNA on cxr 07/15/10- tx avelox and tussionex  last saw ENT -Dr Marvetta Gibbons july 2011  - tx with mucinex and nettie pott;   chest symptoms, fever and cough have improved s/p 10d avelox - however now right ear pain and fullness, dec hearing and pain on right side  chronic med issues also reviewed: 1) CLL - in remission - dx 2002 and tx 12/2001 with clinical trial at [facility] - follows with local onc every 4-6 months -  2) allg rhinitis/sinusitus - freq flares and recurrent infx for past 12 months - reports compliance with ongoing medical treatment and no changes in medication dose or frequency. denies adverse side effects related to current therapy. follows with ENT for same  3) squam cell cancer, skin - 4 areas excised positive - sees derm 3-4x/year -  no current skin changes -   Current Medications (verified): 1)  Omnaris 50 Mcg/act Susp (Ciclesonide) .... Take 2 Spray Each Nostril Once Daily 2)  Zyrtec Allergy 10 Mg Tbdp (Cetirizine Hcl) .Marland Kitchen.. 1 By Mouth Once Daily 3)  Vear Clock Colon Health  Caps (Probiotic Product) .Marland Kitchen.. 1 By Mouth Once Daily 4)  Omega-3 1000 Mg Caps (Omega-3 Fatty Acids) .Marland Kitchen.. 1 By Mouth Two Times A Day  Allergies (verified): 1)  ! Penicillin 2)  ! Sulfa 3)  ! * Pentamidine  Past  History:  Past Medical History: Anemia-NOS  CLL, remission - dx 2002, tx 2003 recurrent sinusitus  MD roster: onc - [facility] - [facility] - [facility] - [facility]  Review of Systems  The patient denies hoarseness, syncope, peripheral edema, and abdominal pain.    Physical Exam  General:  alert and overweight-appearing., nontoxic but uncomfortable right ear Ears:  R ear erythema TM with purulent effusion and air bubbles Mouth:  teeth and gums in good repair; mucous membranes moist, without lesions or ulcers. oropharynx clear without exudate, min erythema.  Lungs:  normal respiratory effort, no intercostal retractions or use of accessory muscles; normal breath sounds bilaterally - no crackles and no wheezes.    Heart:  normal rate, regular rhythm, no murmur, and no rub. BLE without edema   Impression & Recommendations:  Problem # 1:  OTITIS MEDIA, ACUTE, RIGHT (ICD-382.9)  LLL PNA resolved but now R OM despite 10d avelox also on max decongestant and nasla steroid and antihist offered steroids for antinflam to help drainage bt pt concerned about immune suppression - i called ENT for pt and arranged appt for today to manage acute decompression as needed - also 7 dmore avelox for cont infx  The following medications were removed from the medication list:    Avelox 400 Mg Tabs (Moxifloxacin hcl) .Marland Kitchen... 1po once daily Her updated medication list for this problem includes:    Avelox 400 Mg Tabs (Moxifloxacin hcl) .Marland Kitchen... 1 by mouth once daily x 7 days  Instructed on prevention and treatment. Call if no improvement in 48-72 hours or sooner if worsening symptoms.   Orders: Prescription Created Electronically [id number])  Problem # 2:  BRONCHITIS-ACUTE (ICD-466.0) Assessment: Improved  cxr 11/21 reviewqed - LLL PNA - fever and cough resolved, exam benign The following medications were removed from the medication list:    Avelox 400 Mg Tabs (Moxifloxacin hcl) .Marland Kitchen... 1po once  daily    Tussionex Pennkinetic Er 10-8 Mg/48ml Lqcr (Hydrocod polst-chlorphen polst) .Marland Kitchen... 1 tsp by mouth two times a day as needed Her updated medication list for this problem includes:    Avelox 400 Mg Tabs (Moxifloxacin hcl) .Marland Kitchen... 1 by mouth once daily x 7 days  Take antibiotics and other medications as directed. Encouraged to push clear liquids, get enough rest, and take acetaminophen as needed. To be seen in 5-7 days if no improvement, sooner if worse.  Complete Medication List: 1)  Omnaris 50 Mcg/act Susp (Ciclesonide) .... Take 2 spray each nostril once daily 2)  Zyrtec Allergy 10 Mg Tbdp (Cetirizine hcl) .Marland Kitchen.. 1 by mouth once daily 3)  Phillips Colon Health Caps (Probiotic product) .Marland Kitchen.. 1 by mouth once daily 4)  Omega-3 1000 Mg Caps (Omega-3 fatty acids) .Marland Kitchen.. 1 by mouth two times a day 5)  Avelox 400 Mg Tabs (Moxifloxacin hcl) .Marland Kitchen.. 1 by mouth once daily x 7 days  Patient Instructions: 1)  it was good to see you today. 2)  pneumonia has resolved - 3)  7days more avelox for ear infection - your prescription has been electronically submitted to your pharmacy. Please take as directed. Contact our office if you believe you're having problems with the medication(s).  4)  appointment today at Dr. Allene Pyo 1:15pm to decide on other treatment of right ear infection/pain 5)  Please schedule a follow-up appointment as needed. Prescriptions: AVELOX 400 MG TABS (MOXIFLOXACIN HCL) 1 by mouth once daily x 7 days  #7 x 0   Entered and Authorized by:   Newt Lukes MD   Signed by:   Newt Lukes MD on 07/25/2010   Method used:   Electronically to        [facility]. [phone]* (retail)       [address]       [place], [place]  [address]       Ph: [phone]       Fax: [phone]   RxID:   [id number]    Orders Added: 1)  Est. Patient Level IV [27253] 2)  Prescription Created Electronically [id number]

## 2010-09-26 NOTE — Letter (Signed)
Summary: [facility]   Imported By: Sherian Rein 03/04/2010 07:38:17  _____________________________________________________________________  External Attachment:    Type:   Image     Comment:   External Document

## 2010-09-26 NOTE — Progress Notes (Signed)
Summary: omnaris  Phone Note From Pharmacy   Caller: [facility]  [place]. #[phone]* Reason for Call: Cannot read prescription Request: Speak with Lilya Smitherman Summary of Call: Recieved electronic rx for Omnaris . Pls verify directions Initial call taken by: Orlan Leavens RMA,  May 06, 2010 1:03 PM  Follow-up for Phone Call        2 sprays each nostril once daily  Follow-up by: Newt Lukes MD,  May 06, 2010 1:17 PM  Additional Follow-up for Phone Call Additional follow up Details #1::        Faxed back paper request with correct directions. Udated EMR Additional Follow-up by: Orlan Leavens RMA,  May 06, 2010 2:03 PM    New/Updated Medications: OMNARIS 50 MCG/ACT SUSP (CICLESONIDE) take 2 spray each nostril once daily

## 2010-09-26 NOTE — Assessment & Plan Note (Signed)
Summary: FEVER,ACHEY    Vital Signs:  Patient profile:   65 year old female Height:      64 inches (162.56 cm) Weight:      219 pounds (99.55 kg) O2 Sat:      96 % on Room air Temp:     100.0 degrees F (37.78 degrees C) oral Pulse rate:   108 / minute BP sitting:   126 / 82  (left arm) Cuff size:   large  Vitals Entered By: Orlan Leavens RMA (May 20, 2010 1:32 PM)  O2 Flow:  Room air CC: Bodyache/ fever Is Patient Diabetic? No Pain Assessment Patient in pain? no        Primary Care Daryl Quiros:  Newt Lukes MD  CC:  Bodyache/ fever.  History of Present Illness: today c/o "body aches" a/w low grade fever onset symptoms 24h ago no dysuria but +polyuria no CP, SOB or cough no n/v/d; no HA or rash no ENT/sinus or allergy symptoms flare no recent travel, +sick contacts but > 10days ago not using OTC fever reducers  prior OV and chronic med issues reviewed: 1) CLL - in remission - dx 2002 and tx 12/2001 with clinical trial at [facility] - follows with local onc every 4-6 months -  2) allg rhinitis/sinusitus - freq flares and recurrent infx for past 12 months - reports compliance with ongoing medical treatment and no changes in medication dose or frequency. denies adverse side effects related to current therapy. follows with ENT for same  3) squam cell cancer, skin - 4 areas excised positive - sees derm 3-4x/year -  no current skin changes -   Clinical Review Panels:  Immunizations   Last Tetanus Booster:  Historical (08/26/2007)   Last Flu Vaccine:  Historical (05/25/2009)   Last Pneumovax:  Historical (05/25/2009)  CBC   WBC:  5.4 (05/17/2010)   RBC:  4.81 (04/18/2009)   Hgb:  14.6 (05/17/2010)   Hct:  44.7 (02/04/2010)   Platelets:  169 (05/17/2010)   MCV  91.6 (04/18/2009)   RDW  13.0 (04/18/2009)   PMN:  68 (04/18/2009)   Monos:  12 (04/18/2009)   Eosinophils:  0.1 (04/18/2009)   Basophil:  0 (04/18/2009)  Complete Metabolic Panel   Glucose:   107 (03/04/2010)   Sodium:  145 (03/04/2010)   Potassium:  3.9 (03/04/2010)   Chloride:  109 (03/04/2010)   CO2:  25 (03/04/2010)   BUN:  15 (03/04/2010)   Creatinine:  1.03 (03/04/2010)   Albumin:  4.1 (04/18/2009)   Total Protein:  6.6 (04/18/2009)   Calcium:  9.7 (03/04/2010)   Total Bili:  1.0 (04/18/2009)   Alk Phos:  46 (04/18/2009)   SGPT (ALT):  47 (04/18/2009)   SGOT (AST):  35 (04/18/2009)   -  Date:  05/17/2010    WBC: 5.4    HGB: 14.6    PLT: 169  Current Medications (verified): 1)  Omnaris 50 Mcg/act Susp (Ciclesonide) .... Take 2 Spray Each Nostril Once Daily 2)  Zyrtec Allergy 10 Mg Tbdp (Cetirizine Hcl) .Marland Kitchen.. 1 By Mouth Once Daily 3)  Vear Clock Colon Health  Caps (Probiotic Product) .Marland Kitchen.. 1 By Mouth Once Daily  Allergies (verified): 1)  ! Penicillin 2)  ! Sulfa 3)  ! * Pentamidine  Past History:  Past Medical History: Anemia-NOS CLL, remission - dx 2002, tx 2003 recurrent sinusitus  MD roster: onc - [facility] - [facility] - [facility] - [facility]  Review of Systems  The patient complains of fever.  The patient denies hoarseness, chest pain, dyspnea on exertion, peripheral edema, headaches, abdominal pain, hematuria, and enlarged lymph nodes.    Physical Exam  General:  overweight-appearing.  alert, well-developed, well-nourished, and cooperative to examination.  nontoxic  Eyes:  vision grossly intact; pupils equal, round and reactive to light.  conjunctiva and lids normal.    Ears:  normal pinnae bilaterally, without erythema, swelling, or tenderness to palpation. TMs clear, without effusion, or cerumen impaction. Hearing grossly normal bilaterally  Mouth:  teeth and gums in good repair; mucous membranes moist, without lesions or ulcers. oropharynx clear without exudate, no erythema.  no PND Lungs:  normal respiratory effort, no intercostal retractions or use of accessory muscles; normal breath sounds bilaterally - no crackles and no  wheezes.    Heart:  normal rate, regular rhythm, no murmur, and no rub. BLE without edema. Abdomen:  onese, soft, non-tender, normal bowel sounds, no distention; no masses and no appreciable hepatomegaly or splenomegaly.   Neurologic:  alert & oriented X3 and cranial nerves II-XII symetrically intact.  strength normal in all extremities, sensation intact to light touch, and gait normal. speech fluent without dysarthria or aphasia; follows commands with good comprehension.    Impression & Recommendations:  Problem # 1:  FEVER UNSPECIFIED (ICD-780.60)  suspect viral syndrome with myalgias - exam otherwise nonlocalizing and nonspecific - hold emperic abx unless localization or symptoms of flu develop (high fever, coryza, etc) check UA/Ucx and tx cipro if +UTI Orders: T-Culture, Urine ([id number]) TLB-Udip w/ Micro ([id number])  Discussed fever control and symptomatic treatment.   Complete Medication List: 1)  Omnaris 50 Mcg/act Susp (Ciclesonide) .... Take 2 spray each nostril once daily 2)  Zyrtec Allergy 10 Mg Tbdp (Cetirizine hcl) .Marland Kitchen.. 1 by mouth once daily 3)  Phillips Colon Health Caps (Probiotic product) .Marland Kitchen.. 1 by mouth once daily  Patient Instructions: 1)  it was good to see you today. 2)  check urine and culture - if evidence for bladder infection, will call you and prescribe cipro for same - 3)  if you develop worsening symptoms or fever, call us and we can reconsider antibiotics but it does not appear necessary to use any anitbiotic at this time  4)  Get plenty of rest, drink lots of clear liquids, and use Tylenol or Ibuprofen for fever and comfort. Return in 7-10 days if you're not better:sooner if you're feeling worse.

## 2010-09-26 NOTE — Assessment & Plan Note (Signed)
Summary: ongoing fever/lmb   Vital Signs:  Patient profile:   65 year old female Height:      64 inches (162.56 cm) Weight:      219 pounds (99.55 kg) O2 Sat:      94 % on Room air Temp:     98.5 degrees F (36.94 degrees C) oral Pulse rate:   86 / minute BP sitting:   110 / 84  (left arm) Cuff size:   large  Vitals Entered By: Orlan Leavens RMA (May 23, 2010 10:48 AM)  O2 Flow:  Room air CC: Sore on (R) foot/ fever Is Patient Diabetic? No Pain Assessment Patient in pain? no        Primary Care Provider:  Newt Lukes MD  CC:  Sore on (R) foot/ fever.  History of Present Illness: seen 3 days ago OV for "body aches" a/w low grade fever onset symptoms 24h ago no dysuria but +polyuria no CP, SOB or cough no n/v/d; no HA or rash no ENT/sinus or allergy symptoms flare no recent travel, +sick contacts but > 10days ago not using OTC fever reducers  now in past 24h, tenderness and pain over right lateral ankle a/w redness and warmth fever actually improved  prior OV and chronic med issues reviewed: 1) CLL - in remission - dx 2002 and tx 12/2001 with clinical trial at [facility] - follows with local onc every 4-6 months -  2) allg rhinitis/sinusitus - freq flares and recurrent infx for past 12 months - reports compliance with ongoing medical treatment and no changes in medication dose or frequency. denies adverse side effects related to current therapy. follows with ENT for same  3) squam cell cancer, skin - 4 areas excised positive - sees derm 3-4x/year -  no current skin changes -   Current Medications (verified): 1)  Omnaris 50 Mcg/act Susp (Ciclesonide) .... Take 2 Spray Each Nostril Once Daily 2)  Zyrtec Allergy 10 Mg Tbdp (Cetirizine Hcl) .Marland Kitchen.. 1 By Mouth Once Daily 3)  Vear Clock Colon Health  Caps (Probiotic Product) .Marland Kitchen.. 1 By Mouth Once Daily  Allergies (verified): 1)  ! Penicillin 2)  ! Sulfa 3)  ! * Pentamidine  Past History:  Past Medical  History: Anemia-NOS  CLL, remission - dx 2002, tx 2003 recurrent sinusitus  MD roster: onc - [facility] - [facility] - [facility] - [facility]  Review of Systems  The patient denies fever, abdominal pain, and difficulty walking.    Physical Exam  General:  overweight-appearing.  alert, well-developed, well-nourished, and cooperative to examination.  nontoxic  Skin:  cellulitis early changes over right lateral ankle 2x3', warm to touch, pink with petichea - no bite, folliculitis or skin disruption apparent - no venous cords or streaking   Impression & Recommendations:  Problem # 1:  CELLULITIS, ANKLE, RIGHT (ICD-682.6)  early changes now evident on exam, prior fevr this week -  start abx - levaquin prev caused severe diarrhea (?c diff-like) when tx for sinus infx allg noted - use doxycycline - increase probiotics and use immodium as needed if diarrhea occurs - to call if BRBPR or fever with diarrhea Her updated medication list for this problem includes:    Doxycycline Hyclate 100 Mg Tabs (Doxycycline hyclate) .Marland Kitchen... 1 by mouth two times a day x 7 days  Elevate affected area. Warm moist compresses for 20 minutes every 2 hours while awake. Take antibiotics as directed and take acetaminophen as needed. To be seen in  48-72 hours if no improvement, sooner if worse.  Orders: Prescription Created Electronically [id number])  Problem # 2:  FEVER UNSPECIFIED (ICD-780.60) see above  Complete Medication List: 1)  Omnaris 50 Mcg/act Susp (Ciclesonide) .... Take 2 spray each nostril once daily 2)  Zyrtec Allergy 10 Mg Tbdp (Cetirizine hcl) .Marland Kitchen.. 1 by mouth once daily 3)  Phillips Colon Health Caps (Probiotic product) .Marland Kitchen.. 1 by mouth once daily 4)  Doxycycline Hyclate 100 Mg Tabs (Doxycycline hyclate) .Marland Kitchen.. 1 by mouth two times a day x 7 days 5)  Omega-3 1000 Mg Caps (Omega-3 fatty acids) .Marland Kitchen.. 1 by mouth two times a day  Patient Instructions: 1)  it was good to see you today. 2)   doxycycline for antibiotics - your prescription has been electronically submitted to your pharmacy. Please take as directed. Contact our office if you believe you're having problems with the medication(s).  3)  phillips colon health separated by 2 hours from antibioitcs and immodium as needed 4)  Please schedule a follow-up appointment as discussed. Prescriptions: DOXYCYCLINE HYCLATE 100 MG TABS (DOXYCYCLINE HYCLATE) 1 by mouth two times a day x 7 days  #14 x 0   Entered and Authorized by:   Newt Lukes MD   Signed by:   Newt Lukes MD on 05/23/2010   Method used:   Electronically to        [facility]. [phone]* (retail)       [address]       [place], [place]  [address]       Ph: [phone]       Fax: [phone]   RxID:   [id number]

## 2010-09-26 NOTE — Letter (Signed)
[facility]   Im[facility] By: Sherian Rein 03/04/2010 07:32:35  _____________________________________________________________________  External Attachment:    Type:   Image     Comment:   External Document

## 2010-09-26 NOTE — Letter (Signed)
[facility]   Imported By: Sherian Rein 03/04/2010 07:33:25  _____________________________________________________________________  External Attachment:    Type:   Image     Comment:   External Document

## 2010-09-26 NOTE — Letter (Signed)
[facility]   Imported By: Sherian Rein 03/04/2010 07:34:35  _____________________________________________________________________  External Attachment:    Type:   Image     Comment:   External Document

## 2010-09-26 NOTE — Assessment & Plan Note (Signed)
Summary: fever,congestion,cough,leschber-lb   Vital Signs:  Patient profile:   65 year old female Height:      64.5 inches Weight:      215.13 pounds BMI:     36.49 O2 Sat:      90 % on Room air Temp:     101 degrees F oral Pulse rate:   113 / minute BP sitting:   134 / 80  (left arm) Cuff size:   large  Vitals Entered By: Zella Ball Ewing CMA Duncan Dull) (July 15, 2010 9:10 AM)  O2 Flow:  Room air CC: Chest and nasal congestion, fever/RE   Primary Care Provider:  Newt Lukes MD  CC:  Chest and nasal congestion and fever/RE.  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, over the past yr with multiple episoedes URI, often requiring antibiotx and steroid tx; last saw ENT -Dr Marvetta Gibbons july 2011  - tx with mucinex and nettie pott;  most recently here with acute onset midl to mod fever, ST and sinus pain/pressure, greenish d/c with nonprod cough x 3 days;  did complete a zpack per oncology the wk before iwth improvement but seemed to come right back; prior to that had doxy course for cellultitis;  c/o fever up to 101 as per today, no chills and Pt denies CP, worsening sob, doe, wheezing, orthopnea, pnd, worsening LE edema, palps, dizziness or syncope    Preventive Screening-Counseling & Management      Drug Use:  no.    Problems Prior to Update: 1)  Bronchitis-acute  (ICD-466.0) 2)  Cellulitis, Ankle, Right  (ICD-682.6) 3)  Fever Unspecified  (ICD-780.60) 4)  Allergic Rhinitis  (ICD-477.9) 5)  Chronic Lymphoid Leukemia in Remission  (ICD-204.11) 6)  Carcinoma, Skin, Squamous Cell  (ICD-173.9) 7)  Anemia-nos  (ICD-285.9) 8)  Family History of Colon Ca 1st Degree Relative <60  (ICD-V16.0) 9)  Family History Breast Cancer 1st Degree Relative <50  (ICD-V16.3)  Medications Prior to Update: 1)  Omnaris 50 Mcg/act Susp (Ciclesonide) .... Take 2  Spray Each Nostril Once Daily 2)  Zyrtec Allergy 10 Mg Tbdp (Cetirizine Hcl) .Marland Kitchen.. 1 By Mouth Once Daily 3)  Vear Clock Colon Health  Caps (Probiotic Product) .Marland Kitchen.. 1 By Mouth Once Daily 4)  Omega-3 1000 Mg Caps (Omega-3 Fatty Acids) .Marland Kitchen.. 1 By Mouth Two Times A Day  Current Medications (verified): 1)  Omnaris 50 Mcg/act Susp (Ciclesonide) .... Take 2 Spray Each Nostril Once Daily 2)  Zyrtec Allergy 10 Mg Tbdp (Cetirizine Hcl) .Marland Kitchen.. 1 By Mouth Once Daily 3)  Vear Clock Colon Health  Caps (Probiotic Product) .Marland Kitchen.. 1 By Mouth Once Daily 4)  Omega-3 1000 Mg Caps (Omega-3 Fatty Acids) .Marland Kitchen.. 1 By Mouth Two Times A Day 5)  Avelox 400 Mg Tabs (Moxifloxacin Hcl) .Marland Kitchen.. 1po Once Daily 6)  Tussionex Pennkinetic Er 10-8 Mg/78ml Lqcr (Hydrocod Polst-Chlorphen Polst) .Marland Kitchen.. 1 Tsp By Mouth Two Times A Day As Needed  Allergies (verified): 1)  ! Penicillin 2)  ! Sulfa 3)  ! 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 No apparent alopecia or balding. Eyes:  vision grossly intact; pupils equal, round and reactive to light.  conjunctiva and lids normal.    Ears:  bilat  tm's mild red, sinus nontender  Nose:  nasal dischargemucosal pallor and mucosal edema.   Mouth:  pharyngeal erythema and fair dentition.   Neck:  supple and no masses.   Lungs:  normal respiratory effort, R decreased breath sounds, and L decreased breath sounds.  with few LLL rales Heart:  normal rate and regular rhythm.   Extremities:  no edema, no erythema    Impression & Recommendations:  Problem # 1:  BRONCHITIS-ACUTE (ICD-466.0)  with few LLL rales - suspect pna - for cxr,  for avelox, mucinex, and tussionex for ocugh  Orders: T-2 View CXR, Same Day (71020.5TC)  Her updated medication list for this problem includes:    Avelox 400 Mg Tabs (Moxifloxacin hcl) .Marland Kitchen... 1po once daily    Tussionex Pennkinetic Er 10-8 Mg/38ml Lqcr (Hydrocod polst-chlorphen polst) .Marland Kitchen... 1 tsp by mouth two times a day as needed  Complete Medication List: 1)  Omnaris 50 Mcg/act Susp (Ciclesonide) .... Take 2 spray each nostril once daily 2)  Zyrtec Allergy 10 Mg Tbdp (Cetirizine hcl) .Marland Kitchen.. 1 by mouth once daily 3)  Phillips Colon Health Caps (Probiotic product) .Marland Kitchen.. 1 by mouth once daily 4)  Omega-3 1000 Mg Caps (Omega-3 fatty acids) .Marland Kitchen.. 1 by mouth two times a day 5)  Avelox 400 Mg Tabs (Moxifloxacin hcl) .Marland Kitchen.. 1po once daily 6)  Tussionex Pennkinetic Er 10-8 Mg/93ml Lqcr (Hydrocod polst-chlorphen polst) .Marland Kitchen.. 1 tsp by mouth two times a day as needed  Patient Instructions: 1)  Please take all new medications as prescribed 2)  Continue all previous medications as before this visit 3)  You can also use Mucinex OTC or it's generic for congestion  4)  Please go to Radiology in the basement level for your X-Ray today  5)  Please call the number on the [facility] Card for results of your testing  6)  Please schedule an appointment with your primary doctor as needed Prescriptions: TUSSIONEX PENNKINETIC ER 10-8 MG/5ML LQCR (HYDROCOD POLST-CHLORPHEN POLST) 1 tsp by mouth two times a day as needed  #6oz x 1   Entered  and Authorized by:   Corwin Levins MD   Signed by:   Corwin Levins MD on 07/15/2010   Method used:   Print then Give to Patient   RxID:   [id number] AVELOX 400 MG TABS (MOXIFLOXACIN HCL) 1po once daily  #10 x 0   Entered and Authorized by:   Corwin Levins MD   Signed by:   Corwin Levins MD on 07/15/2010   Method used:   Print then Give to Patient   RxID:   [id number]    Orders Added: 1)  T-2 View CXR, Same Day [71020.5TC] 2)  Est. Patient Level III [87564]

## 2010-09-26 NOTE — Progress Notes (Signed)
Summary: ABX?  Phone Note Call from Patient Call back at [facility] Phone [phone]   Caller: Patient Summary of Call: Pt called stating she has residual nasal drainage from previous cold which has now turned green. Pt has Avelox Rx given in November 2011 and would like MD to advise on if she should use it now, no fever and cough had almost subsited. Initial call taken by: Margaret Pyle, CMA,  September 10, 2010 10:24 AM  Follow-up for Phone Call        if no fever and no sinus pressure, can watch without abx at this time - if continued green discharge >72h, or with fever or sinus pressure/pain, ok to take avelox as prev rx'd - Follow-up by: Newt Lukes MD,  September 10, 2010 12:15 PM  Additional Follow-up for Phone Call Additional follow up Details #1::        Pt advised and understood Additional Follow-up by: Margaret Pyle, CMA,  September 10, 2010 1:53 PM

## 2010-09-26 NOTE — Letter (Signed)
[facility]   Imported By: Sherian Rein 03/04/2010 07:37:05  _____________________________________________________________________  External Attachment:    Type:   Image     Comment:   External Document

## 2010-09-26 NOTE — Progress Notes (Signed)
Summary: Phlebitis?  Phone Note Call from Patient Call back at [facility] Phone [phone]   Caller: Patient Summary of Call: Pt called stating that yesterday she noticed a sore round area on her LT lower leg. Area is red and warm and painful to the touch. Pt is concern that she may have phlebitis. Pt has been on bed rest since OV and does not think it could be a bug bite. Please advise. Initial call taken by: Margaret Pyle, CMA,  May 23, 2010 8:29 AM  Follow-up for Phone Call        can send rx for levaquin 500mg  qd x7d to cover for possible infection given recent fever symptoms- or pt can sched ROV as needed as i can not be certain of dx w/o visualizing this - send erx if desired or wait until OV to eval - pt may choose, please let me know - thanks Follow-up by: Newt Lukes MD,  May 23, 2010 8:58 AM  Additional Follow-up for Phone Call Additional follow up Details #1::        pt decided to hold off on ABX and make OV this morning 10:30a Additional Follow-up by: Margaret Pyle, CMA,  May 23, 2010 9:20 AM

## 2010-09-26 NOTE — Letter (Signed)
Summary: [facility]  [facility]   Imported By: Sherian Rein 07/25/2010 14:25:21  _____________________________________________________________________  External Attachment:    Type:   Image     Comment:   External Document

## 2010-09-26 NOTE — Assessment & Plan Note (Signed)
Summary: new / [facility] / # cd   Vital Signs:  Patient profile:   65 year old female Height:      64.5 inches (163.83 cm) Weight:      219.12 pounds (99.60 kg) BMI:     37.16 O2 Sat:      95 % on Room air Temp:     98.4 degrees F (36.89 degrees C) oral Pulse rate:   89 / minute BP sitting:   102 / 82  (left arm) Cuff size:   large  Vitals Entered By: Orlan Leavens (February 20, 2010 8:16 AM)  O2 Flow:  Room air CC: New patient Is Patient Diabetic? No Pain Assessment Patient in pain? no        Primary Care Provider:  Newt Lukes MD  CC:  New patient.  History of Present Illness: new pt to me and our practice, here to est care -  1) CLL - in remission - dx 2002 and tx 12/2001 with clinical trial at [facility] - follows with local onc every 4-6 months -  2) allg rhinitis/sinusitus - freq flares and recurrent infx for past 12 months - reports compliance with ongoing medical treatment and no changes in medication dose or frequency. denies adverse side effects related to current therapy. follows with ENT for same  3) squam cell cancer, skin - 4 areas excised positive - sees derm 3-4x/year -  no current skin changes -   Preventive Screening-Counseling & Management  Alcohol-Tobacco     Alcohol drinks/day: <1     Alcohol Counseling: not indicated; patient does not drink     Smoking Status: never     Tobacco Counseling: not indicated; no tobacco use  Caffeine-Diet-Exercise     Diet Counseling: not indicated; diet is assessed to be healthy     Exercise Counseling: not indicated; exercise is adequate     Depression Counseling: not indicated; screening negative for depression  Safety-Violence-Falls     Seat Belt Counseling: not indicated; patient wears seat belts     Helmet Counseling: not indicated; patient wears helmet when riding bicycle/motocycle     Firearms in the Home: no firearms in the home     Firearm Counseling: not applicable     Violence in the Home: no  risk noted     Fall Risk Counseling: not indicated; no significant falls noted  Clinical Review Panels:  Prevention   Last Mammogram:  No specific mammographic evidence of malignancy.  [facility]   Assessment: BIRADS 1. (06/07/2009)   Last Pap Smear:  Interpretation /Result:Negative for intraepithelial Lesion or Malignancy.    (03/25/2008)  Immunizations   Last Tetanus Booster:  Historical (08/26/2007)   Last Flu Vaccine:  Historical (05/25/2009)   Last Pneumovax:  Historical (05/25/2009)  CBC   WBC:  5.0 (04/18/2009)   RBC:  4.81 (04/18/2009)   Hgb:  15.0 (04/18/2009)   Hct:  44.1 (04/18/2009)   Platelets:  167 (04/18/2009)   MCV  91.6 (04/18/2009)   RDW  13.0 (04/18/2009)   PMN:  68 (04/18/2009)   Monos:  12 (04/18/2009)   Eosinophils:  0.1 (04/18/2009)   Basophil:  0 (04/18/2009)  Complete Metabolic Panel   Glucose:  103 (04/18/2009)   Sodium:  139 (04/18/2009)   Potassium:  3.9 (04/18/2009)   Chloride:  109 (04/18/2009)   CO2:  24 (04/18/2009)   BUN:  12 (04/18/2009)   Creatinine:  0.99 (04/18/2009)   Albumin:  4.1 (  04/18/2009)   Total Protein:  6.6 (04/18/2009)   Calcium:  9.7 (04/18/2009)   Total Bili:  1.0 (04/18/2009)   Alk Phos:  46 (04/18/2009)   SGPT (ALT):  47 (04/18/2009)   SGOT (AST):  35 (04/18/2009)   -  Date:  04/18/2009    WBC: 5.0    HGB: 15.0    HCT: 44.1    RBC: 4.81    PLT: 167    MCV: 91.6    RDW: 13.0    Neutrophil: 68    Lymphs: 18    Monos: 12    Eos: 0.1    Basophil: 0    BG Random: 103    BUN: 12    Creatinine: 0.99    Sodium: 139    Potassium: 3.9    Chloride: 109    CO2 Total: 24    SGOT (AST): 35    SGPT (ALT): 47    T. Bilirubin: 1.0    Alk Phos: 46    Calcium: 9.7    Total Protein: 6.6    Albumin: 4.1  Current Medications (verified): 1)  Omnaris 50 Mcg/act Susp (Ciclesonide) .... Take 1 By Mouth Once Daily 2)  Levocetirizine Dihydrochloride 5 Mg Tabs (Levocetirizine  Dihydrochloride) .... Take 1 By Mouth Once Daily  Allergies (verified): 1)  ! Penicillin 2)  ! Sulfa 3)  ! 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 I have reviewed all other systems and they were negative.   Physical Exam  General:  overweight-appearing.  alert, well-developed, well-nourished, and cooperative to examination.    Head:  Normocephalic and atraumatic without obvious abnormalities. No apparent alopecia or balding. Eyes:  vision grossly intact; pupils equal, round and reactive to light.  conjunctiva and lids normal.   wears glasses Ears:  normal pinnae bilaterally, without erythema, swelling, or tenderness to palpation. TMs clear, without effusion, or cerumen impaction. Hearing grossly normal bilaterally  Nose:  External nasal examination shows no deformity or inflammation. Nasal mucosa are pink and moist without lesions or exudates. Mouth:  teeth and gums in good repair; mucous membranes moist, without lesions or ulcers. oropharynx clear without exudate, no erythema.  no PND Neck:  supple, full ROM, no masses, no thyromegaly; no thyroid nodules or tenderness. no JVD or carotid bruits.   Lungs:  normal respiratory effort, no intercostal retractions or use of accessory muscles; normal breath sounds bilaterally - no crackles and no wheezes.    Heart:  normal rate, regular rhythm, no murmur, and no rub. BLE without  edema. Abdomen:  onese, soft, non-tender, normal bowel sounds, no distention; no masses and no appreciable hepatomegaly or splenomegaly.   Genitalia:  defer to gyn Msk:  No deformity or scoliosis noted of thoracic or lumbar spine.   Neurologic:  alert & oriented X3 and cranial nerves II-XII symetrically intact.  strength normal in all extremities, sensation intact to light touch, and gait normal. speech fluent  without dysarthria or aphasia; follows commands with good comprehension.  Skin:  no rashes, vesicles, ulcers, or erythema. No nodules or irregularity to palpation.  Psych:  Oriented X3, memory intact for recent and remote, normally interactive, good eye contact, not anxious appearing, not depressed appearing, and not agitated.      Impression & Recommendations:  Problem # 1:  CHRONIC LYMPHOID LEUKEMIA IN REMISSION (ICD-204.11) hx extensively reviewed today: dx 2002, clinical trial tx 12/2001 in [place] [facility] - follows with local heme 3-4x/years in complete remission but ?dysfx of immunity -  heme considering tx with immuneglobulin for same - will await records from heme re: same pt welcome to call if concern for infx (ie, fever, cough symptoms or sinus pain)  Time spent with patient 45 minutes, more than 50% of this time was spent reviewing history, medications and plans for infection survellience   Problem # 2:  CARCINOMA, SKIN, SQUAMOUS CELL (ICD-173.9) s/y 4 excisions for same - 2 on labia, otehrs extremities -  cont to follow with derm 2-4x/year as ongoing  Problem # 3:  ALLERGIC RHINITIS (ICD-477.9)  likely contrib to recurrent cough and sinus symptoms in past year - cont current antihistamines and steroid nasal spray - ok to change to OTC zyrtec (on rx zylal >30d w/o sig change in symptoms) encouraged use of mucinex for cough as needed and to call if fever or unimproved with symptoms mgmt - no evidence for chronic bronchitis at this time Her updated medication list  for this problem includes:    Omnaris 50 Mcg/act Susp (Ciclesonide) .Marland Kitchen... Take 1 by mouth once daily    Zyrtec Allergy 10 Mg Tbdp (Cetirizine hcl) .Marland Kitchen... 1 by mouth once daily  Discussed use of allergy medications and environmental measures.   Complete Medication List: 1)  Omnaris 50 Mcg/act Susp (Ciclesonide) .... Take 1 by mouth once daily 2)  Zyrtec Allergy 10 Mg Tbdp (Cetirizine hcl) .Marland Kitchen.. 1 by mouth once daily 3)  Phillips Colon Health Caps (Probiotic product) .Marland Kitchen.. 1 by mouth once daily  Patient Instructions: 1)  it was good to see you today. 2)  your medical history has been reviewed - will await further records from dr. Cyndie Chime and Ezzard Standing as they are available 3)  ok to change to zyrtec; continue nose spray (call if/when refills needed) 4)  use Mucinex as needed for cough and call if symptoms unimproved or any fever  5)  continue to follow with dr. Elana Alm for PAP/pelvic or let us know if referal to gynecology desired 6)  Please schedule a follow-up appointment as needed (at least every 6 months)   Pap Smear  Procedure date:  03/25/2008  Findings:      Interpretation /Result:Negative for intraepithelial Lesion or Malignancy.     Mammogram  Procedure date:  06/07/2009  Findings:      No specific mammographic evidence of malignancy.  Location: [facility].   Assessment: BIRADS 1.   CXR  Procedure date:  02/04/2010  Findings:      Done @ [place] Impression: No active disease     Immunization History:  Tetanus/Td Immunization History:    Tetanus/Td:  historical (08/26/2007)  Influenza Immunization History:    Influenza:  historical (05/25/2009)  Pneumovax Immunization History:    Pneumovax:  historical (05/25/2009)

## 2010-09-26 NOTE — Letter (Signed)
Summary: [facility]   Imported By: Sherian Rein 03/04/2010 07:39:05  _____________________________________________________________________  External Attachment:    Type:   Image     Comment:   External Document

## 2010-09-26 NOTE — Progress Notes (Signed)
  Phone Note Other Incoming   Request: Send information Summary of Call: Records received from [facility]. 2 pages forwarded to Dr. Felicity Coyer for review.

## 2010-09-26 NOTE — Letter (Signed)
Summary: Daiva Huge MD  Daiva Huge MD   Imported By: Lennie Odor 03/06/2010 16:58:28  _____________________________________________________________________  External Attachment:    Type:   Image     Comment:   External Document

## 2010-09-26 NOTE — Progress Notes (Signed)
Summary: Ear pain  Phone Note Call from Patient Call back at Home Phone [phone]   Caller: Patient Summary of Call: Pt called stating that she had began developing a cold earlier this week and was given a Z-Pak by Dr Cyndie Chime. Pt has also been taking Mucinex and occasional Sudafed. Pt says that during the night last nught she awoke with terrible ear pain and took some Sudafed which helped with the pain. SHe has been monitoring her BP which is stable. Pt wanted to inform MD because her Immune system is compromised due to Leukemia, is there is something else she can and should be doing? Initial call taken by: Margaret Pyle, CMA,  July 12, 2010 10:20 AM  Follow-up for Phone Call        noted - if any fever or cont recurring ear pain, would need to be seen for poss re-tx abx - otherwise, cont tx as ongoing with sudafed to decongest - thanks Follow-up by: Newt Lukes MD,  July 12, 2010 2:25 PM  Additional Follow-up for Phone Call Additional follow up Details #1::        Notified pt with md response Additional Follow-up by: Orlan Leavens RMA,  July 12, 2010 3:15 PM

## 2010-09-27 NOTE — Procedures (Signed)
[facility]   Imported By: Sherian Rein 03/01/2010 09:15:13  _____________________________________________________________________  External Attachment:    Type:   Image     Comment:   External Document

## 2010-10-02 ENCOUNTER — Ambulatory Visit (INDEPENDENT_AMBULATORY_CARE_PROVIDER_SITE_OTHER)
Admission: RE | Admit: 2010-10-02 | Discharge: 2010-10-02 | Disposition: A | Discharge status: Home / Self Care | Payer: 59 | Source: Ambulatory Visit | Attending: Internal Medicine | Admitting: Internal Medicine

## 2010-10-02 ENCOUNTER — Other Ambulatory Visit: Payer: Self-pay | Admitting: Internal Medicine

## 2010-10-02 ENCOUNTER — Encounter: Payer: Self-pay | Admitting: Internal Medicine

## 2010-10-02 ENCOUNTER — Ambulatory Visit (INDEPENDENT_AMBULATORY_CARE_PROVIDER_SITE_OTHER): Payer: 59 | Admitting: Internal Medicine

## 2010-10-02 DIAGNOSIS — J209 Acute bronchitis, unspecified: Secondary | ICD-10-CM

## 2010-10-02 DIAGNOSIS — J309 Allergic rhinitis, unspecified: Secondary | ICD-10-CM

## 2010-10-03 ENCOUNTER — Encounter (INDEPENDENT_AMBULATORY_CARE_PROVIDER_SITE_OTHER): Payer: Self-pay | Admitting: *Deleted

## 2010-10-10 NOTE — Assessment & Plan Note (Signed)
Summary: cold,resp illness   Vital Signs:  Patient profile:   65 year old female Height:      64.5 inches (163.83 cm) O2 Sat:      94 % on Room air Temp:     98.0 degrees F (36.67 degrees C) oral Pulse rate:   82 / minute BP sitting:   110 / 82  (left arm) Cuff size:   large  Vitals Entered By: Orlan Leavens RMA (October 02, 2010 10:44 AM)  O2 Flow:  Room air CC: Cold sxs Is Patient Diabetic? No Pain Assessment Patient in pain? no        Primary Care Provider:  Newt Lukes MD  CC:  Cold sxs.  History of Present Illness: nasal and bronchial congestion since 08/18/10 - (>6 weeks intermitt symptoms) clear nasal discharge, turned green - took avelox and returned to clear - now with cough and green sputum -cont nasal discharge no fever, compliant with meds for allg rhinitis symptoms worse at night - symptoms imporved with otc meds (mucinex d) but not resolving symptoms   chronic med issues also reviewed: 1) CLL - in remission - dx 2002 and tx 12/2001 with clinical trial at [facility] - follows with local onc every 4-6 months -  2) allg rhinitis/sinusitus - freq flares and recurrent infx in past - reports compliance with ongoing medical treatment and no changes in medication dose or frequency. denies adverse side effects related to current therapy. follows with ENT for same  3) hx squam cell cancer, skin - 4 areas excised positive for ca - sees derm 3-4x/year -  no current skin changes -   Current Medications (verified): 1)  Omnaris 50 Mcg/act Susp (Ciclesonide) .... Take 2 Spray Each Nostril Once Daily 2)  Zyrtec Allergy 10 Mg Tbdp (Cetirizine Hcl) .Marland Kitchen.. 1 By Mouth Once Daily 3)  Vear Clock Colon Health  Caps (Probiotic Product) .Marland Kitchen.. 1 By Mouth Once Daily 4)  Omega-3 1000 Mg Caps (Omega-3 Fatty Acids) .Marland Kitchen.. 1 By Mouth Two Times A Day  Allergies (verified): 1)  ! Penicillin 2)  ! Sulfa 3)  ! * Pentamidine  Past History:  Past Medical History: Anemia-NOS  CLL,  remission - dx 2002, tx 2003 recurrent sinusitus  MD roster: onc - [facility] - [facility]  ent - [facility]   Review of Systems       The patient complains of prolonged cough.  The patient denies anorexia, fever, hoarseness, hemoptysis, and abdominal pain.    Physical Exam  General:  alert and overweight-appearing., nontoxic but hoarse Head:  Normocephalic and atraumatic without obvious abnormalities. No apparent alopecia or balding. Eyes:  vision grossly intact; pupils equal, round and reactive to light.  conjunctiva and lids normal.    Ears:  normal pinnae bilaterally, without erythema, swelling, or tenderness to palpation. TMs clear, without effusion, or cerumen impaction. Hearing grossly normal bilaterally  Nose:  nontender sinus Mouth:  teeth and gums in good repair; mucous membranes moist, without lesions or ulcers. oropharynx clear without exudate, min erythema.  Lungs:  normal respiratory effort, no intercostal retractions or use of accessory muscles; normal breath sounds bilaterally - no crackles and no wheezes.    Heart:  normal rate, regular rhythm, no murmur, and no rub. BLE without edema.   Impression & Recommendations:  Problem # 1:  BRONCHITIS-ACUTE (ICD-466.0)  ongoing cough >6 weeks - ?allg component as well given duration and green sputum with hx CLL (compromised immune fx)  tx abx cont mucinex anttusiive (poor tol narcotics) Her updated medication list for this problem includes:    Biaxin 500 Mg Tabs (Clarithromycin) .Marland Kitchen... 1 by mouth two times a day x 7days  Orders: T-2 View CXR (71020TC) Prescription Created Electronically [phone])  Take antibiotics and other medications as directed. Encouraged to push clear liquids, get enough rest, and take acetaminophen as needed. To be seen in 5-7 days if no improvement, sooner if worse.  Problem # 2:  ALLERGIC RHINITIS (ICD-477.9)  Her updated medication list for this problem includes:    Omnaris 50  Mcg/act Susp (Ciclesonide) .Marland Kitchen... Take 2 spray each nostril once daily    Zyrtec Allergy 10 Mg Tbdp (Cetirizine hcl) .Marland Kitchen... 1 by mouth once daily  likely contrib to recurrent cough and sinus symptoms in past year - cont current antihistamines and steroid nasal spray - ok  OTC zyrtec  encouraged use of mucinex for cough as needed and to call if fever or unimproved with symptoms mgmt - refer to allg for opinion on alt mgmt of same  Discussed use of allergy medications and environmental measures.   Orders: Allergy Referral  (Allergy)  Complete Medication List: 1)  Omnaris 50 Mcg/act Susp (Ciclesonide) .... Take 2 spray each nostril once daily 2)  Zyrtec Allergy 10 Mg Tbdp (Cetirizine hcl) .Marland Kitchen.. 1 by mouth once daily 3)  Phillips Colon Health Caps (Probiotic product) .Marland Kitchen.. 1 by mouth once daily 4)  Omega-3 1000 Mg Caps (Omega-3 fatty acids) .Marland Kitchen.. 1 by mouth two times a day 5)  Biaxin 500 Mg Tabs (Clarithromycin) .Marland Kitchen.. 1 by mouth two times a day x 7days  Patient Instructions: 1)  it was good to see you today. 2)  chest xray today - you will be called with results -  3)  Biaxin antibioitcs for bronchitis symptoms - continue mucinex as ongoing for cough and current allergy medications - your prescriptions have been electronically submitted to your pharmacy. Please take as directed. Contact our office if you believe you're having problems with the medication(s).  4)  we'll make referral to allergy specialist. Our office will contact you regarding this appointment once made.  Prescriptions: BIAXIN 500 MG TABS (CLARITHROMYCIN) 1 by mouth two times a day x 7days  #14 x 0   Entered and Authorized by:   Newt Lukes MD   Signed by:   Newt Lukes MD on 10/02/2010   Method used:   Electronically to        [facility]. [phone]* (retail)       [address]       [place], [place]  [address]       Ph: [phone]       Fax: [phone]   RxID:    [id number]    Orders Added: 1)  T-2 View CXR [71020TC] 2)  Allergy Referral  [Allergy] 3)  Est. Patient Level IV [[address]] 4)  Prescription Created Electronically [phone]

## 2010-10-10 NOTE — Letter (Signed)
Summary: [facility] Consult Scheduled Letter  [facility]  [address] [place], [place] [address]   Phone: [phone]  Fax: [phone]      10/03/2010 MRN: [id number]  [facility] [address] [place], [place]  [address]  [place]    Dear Ms. Brandenberger,      We have scheduled an appointment for you.  At the recommendation of Dr.Leschber, we have scheduled you a consult with Dr Maple Hudson on 11/01/10 at 3:30pm.  Their phone number is [phone].  If this appointment day and time is not convenient for you, please feel free to call the office of the doctor you are being referred to at the number listed above and reschedule the appointment.     [facility] [address] [place], [place] [address] *Pulmonary Dept.2nd floor*    Please give 24hr notice if you need to cancel/reschedule to avoid a $50.00 fee.Also bring insurance card and any co-pay due at time of visit.    Thank you,  Patient Care Coordinator [facility]

## 2010-11-01 ENCOUNTER — Encounter: Payer: Self-pay | Admitting: Internal Medicine

## 2010-11-01 ENCOUNTER — Encounter (INDEPENDENT_AMBULATORY_CARE_PROVIDER_SITE_OTHER): Payer: Self-pay | Admitting: *Deleted

## 2010-11-01 ENCOUNTER — Other Ambulatory Visit: Payer: 59

## 2010-11-01 ENCOUNTER — Institutional Professional Consult (permissible substitution) (INDEPENDENT_AMBULATORY_CARE_PROVIDER_SITE_OTHER): Payer: 59 | Admitting: Internal Medicine

## 2010-11-01 DIAGNOSIS — J328 Other chronic sinusitis: Secondary | ICD-10-CM | POA: Insufficient documentation

## 2010-11-01 DIAGNOSIS — C9111 Chronic lymphocytic leukemia of B-cell type in remission: Secondary | ICD-10-CM

## 2010-11-01 DIAGNOSIS — H6690 Otitis media, unspecified, unspecified ear: Secondary | ICD-10-CM

## 2010-11-01 DIAGNOSIS — T887XXA Unspecified adverse effect of drug or medicament, initial encounter: Secondary | ICD-10-CM

## 2010-11-01 DIAGNOSIS — Z889 Allergy status to unspecified drugs, medicaments and biological substances status: Secondary | ICD-10-CM | POA: Insufficient documentation

## 2010-11-06 ENCOUNTER — Other Ambulatory Visit: Payer: Self-pay | Admitting: Dermatology

## 2010-11-08 ENCOUNTER — Other Ambulatory Visit: Payer: Self-pay | Admitting: Oncology

## 2010-11-08 ENCOUNTER — Encounter (HOSPITAL_BASED_OUTPATIENT_CLINIC_OR_DEPARTMENT_OTHER): Payer: 59 | Admitting: Oncology

## 2010-11-08 DIAGNOSIS — C9111 Chronic lymphocytic leukemia of B-cell type in remission: Secondary | ICD-10-CM

## 2010-11-08 LAB — CBC WITH DIFFERENTIAL/PLATELET
BASO%: 0.2 % (ref 0.0–2.0)
Basophils Absolute: 0 10*3/uL (ref 0.0–0.1)
EOS%: 2.4 % (ref 0.0–7.0)
HCT: 42.6 % (ref 34.8–46.6)
HGB: 14.4 g/dL (ref 11.6–15.9)
MCH: 30.8 pg (ref 25.1–34.0)
MONO#: 0.5 10*3/uL (ref 0.1–0.9)
NEUT%: 57 % (ref 38.4–76.8)
RDW: 13.5 % (ref 11.2–14.5)
WBC: 6.6 10*3/uL (ref 3.9–10.3)
lymph#: 2.2 10*3/uL (ref 0.9–3.3)

## 2010-11-08 LAB — MORPHOLOGY: PLT EST: ADEQUATE

## 2010-11-11 LAB — CONVERTED CEMR LAB: IgE (Immunoglobulin E), Serum: <1.5 intl units/mL (ref 0.0–180.0)

## 2010-11-12 NOTE — Assessment & Plan Note (Signed)
Summary: ALLERGIC RHINITIS/LESCHBER/CB   Primary Provider/Referring Provider:  Newt Lukes MD  CC:  Allergy Consult-Dr. Gigi Gin.Marland Kitchen  History of Present Illness: November 01, 2010- 65yoF referred by Dr Felicity Coyer with recurrent sinusitis and bronchits, askingif there may be an allergy component. Husband Mr [name] is with her.  Has CLL followed by Dr Cyndie Chime and in prolonged remission. Says she is chronically immunosuppressed, leading to recurrent respiratory infections, and Dr Cyndie Chime has measured immunoglobulins and is considering IVIG.  Over the last 18 months, she has had recurrent sinusitis, bronchitis and otitis, triggered by colds, and getting well in between, responding to antibiotics. Dr C.Newman/ENT drained Right otitis and Rx'd Avelox. He suggested allergy testing . She had a pneumonia in November. Now on clarithromycin for cough and blowing green from nose. Doubts fever this time. 64 year old grndson in daycare spends a lot of time with her and brings a lot of colds.  Taking otc Zyrtec, Ciprodex, Neti pot. Allergic to PCN, sulfa, pentamidine with rash.  No significant hx of seasonal or contact allergy or asthma in the past.  [place], no mold or smokers, limited cat exposure.  Preventive Screening-Counseling & Management  Alcohol-Tobacco     Alcohol drinks/day: <1     Alcohol Counseling: not indicated; patient does not drink     Smoking Status: never     Tobacco Counseling: not indicated; no tobacco use  Current Medications (verified): 1)  Omnaris 50 Mcg/act Susp (Ciclesonide) .... Take 2 Spray Each Nostril Once Daily 2)  Zyrtec Allergy 10 Mg Tbdp (Cetirizine Hcl) .Marland Kitchen.. 1 By Mouth Once Daily 3)  Vear Clock Colon Health  Caps (Probiotic Product) .Marland Kitchen.. 1 By Mouth Once Daily 4)  Omega-3 1000 Mg Caps (Omega-3 Fatty Acids) .Marland Kitchen.. 1 By Mouth Two Times A Day  Allergies (verified): 1)  ! Penicillin 2)  ! Sulfa 3)  ! 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, Production designer, theatre/television/film- retired married, lives with husband Mr [name], grown children involved in providing daycare for Astaria Nanez g-son Drug use-no  Risk Factors: Alcohol Use: <1 (11/01/2010)  Risk Factors: Smoking Status: never (11/01/2010)  Past Medical History: Anemia-NOS  CLL, remission - dx 2002, tx 2003 recurrent sinusitus recurrent bronchitis recurrent otitis media  MD roster: onc - [facility] - [facility]  ent - [facility]   Past Surgical History: Hysterectomy (1966) Tonsils Bunionectomy  Family History: Family History Breast cancer 1st degree relative <50 (other relative) Family History of Colon CA 1st degree relative <60 (mother) Family History Ovarian cancer (other relative) Father- died sepsis Mother died colon cancer, muscular dystrophy  Social History: Never Smoked 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  The patient denies shortness of breath with activity, shortness of breath at rest, coughing up blood, chest pain, irregular heartbeats, loss of appetite, weight change, abdominal  pain, difficulty swallowing, headaches, sneezing, itching, anxiety, depression, hand/feet swelling, joint stiffness or pain, and  fever.    Vital Signs:  Patient profile:   65 year old female Height:      64.5 inches Weight:      222.13 pounds BMI:     37.68 O2 Sat:      98 % on Room air Pulse rate:   94 / minute BP sitting:   118 / 62  (left arm) Cuff size:   regular  Vitals Entered By: Reynaldo Minium CMA (November 01, 2010 3:41 PM)  O2 Flow:  Room air CC: Allergy Consult-Dr. Gigi Gin.   Physical Exam  Additional Exam:  General: A/Ox3; pleasant and cooperative, NAD, overweight,  SKIN: no rash, lesions on exposed skin NODES: no lymphadenopathy HEENT: [place]/AT, EOM- WNL, Conjuctivae- clear, PERRLA, TM left wnl, right-red, crusting, Nose- mucus bridging, Throat- clear and wnl NECK: Supple w/ fair ROM, JVD- none, normal carotid impulses w/o bruits Thyroid- normal to palpation CHEST: Clear to P&A HEART: RRR, no m/g/r heard ABDOMEN: Soft and nl; nml bowel sounds; no organomegaly or masses noted YQM:VHQI, nl pulses, no edema  NEURO: Grossly intact to observation      Impression & Recommendations:  Problem # 1:  ALLERGIC RHINITIS (ICD-477.9) We will do an IGE survey and bring her back for skin testing. If there is much immunoglobulin suppression, then IgE production may be low as well. The hx strongly suggest more of an IgG or subclass deficiency in the context of repeated viral exposure from her grandson. They may need to face a decison about alternatives that reduce that exposure.  Her updated medication list for this problem includes:    Omnaris 50 Mcg/act Susp (Ciclesonide) .Marland Kitchen... Take 2 spray each nostril once daily    Zyrtec Allergy 10 Mg Tbdp (Cetirizine hcl) .Marland Kitchen... 1 by mouth once daily  Problem # 2:  RHINOSINUSITIS, CHRONIC (ICD-473.8) She has not responded to 8 days of biaxin, with continued green. She has had several courses of avelox. Her pcn hx was mild rash, with no recalled problem from cephalosporins remotely. We will try cefdinir. She understands there can be approx 10 % cross sensitivity between  penicillins and cephalosporin  Problem # 3:  ADVERSE DRUG REACTION (ICD-995.20)  We will try as she requests to clarify allergy re her pcn and sulf history. I explained that this doesn't prove tolerance, but may indicate degree of liklihood of severe acute reaction.   Orders: Consultation Level IV [phone]) T- * Misc. Laboratory test [phone])  Medications Added to Medication List This Visit: 1)  Cefdinir 300 Mg Caps (Cefdinir) .... 2 daily x 10 days  Other Orders: T-Allergy Profile [place], [place], [place], [place], [place] [phone])  Patient Instructions: 1)  script sent for cefdinir antibiotic to take instead of clarithromycin for now. You can take a daily antihistamine while on this if desired.  2)  Lab 3)  Schedule return for allergy skin testing. Stop all antihistamines 3 days before skin testing, including cold and allergy meds, otc sleep and cough meds.  4)  cc Dr Trish Mage, Dr Cyndie Chime Prescriptions: CEFDINIR 300 MG CAPS (CEFDINIR) 2 daily x 10 days  #20 x 0   Entered and Authorized by:   Waymon Budge MD   Signed by:   Waymon Budge MD on 11/01/2010   Method used:   Electronically to        [facility]. [phone]* (retail)  [address]       [place], [place]  [address]       Ph: [phone]       Fax: [phone]   RxID:   [id number]

## 2010-11-30 LAB — DIFFERENTIAL
Eosinophils Relative: 2 % (ref 0–5)
Lymphocytes Relative: 18 % (ref 12–46)
Lymphs Abs: 0.9 10*3/uL (ref 0.7–4.0)
Monocytes Absolute: 0.6 10*3/uL (ref 0.1–1.0)
Monocytes Relative: 12 % (ref 3–12)

## 2010-11-30 LAB — COMPREHENSIVE METABOLIC PANEL
AST: 35 U/L (ref 0–37)
Albumin: 4.1 g/dL (ref 3.5–5.2)
Calcium: 9.7 mg/dL (ref 8.4–10.5)
Creatinine, Ser: 0.99 mg/dL (ref 0.4–1.2)
GFR calc Af Amer: >60 mL/min (ref >60)
GFR calc non Af Amer: 57 mL/min — ABNORMAL LOW (ref >60)
Sodium: 139 mEq/L (ref 135–145)
Total Protein: 6.6 g/dL (ref 6.0–8.3)

## 2010-11-30 LAB — CBC
MCHC: 34.1 g/dL (ref 30.0–36.0)
RBC: 4.81 MIL/uL (ref 3.87–5.11)
RDW: 13 % (ref 11.5–15.5)

## 2010-12-06 ENCOUNTER — Encounter: Payer: Self-pay | Admitting: Internal Medicine

## 2010-12-06 ENCOUNTER — Ambulatory Visit (INDEPENDENT_AMBULATORY_CARE_PROVIDER_SITE_OTHER): Payer: 59 | Admitting: Internal Medicine

## 2010-12-06 VITALS — BP 110/80 | HR 75 | Temp 98.3°F | Ht 64.5 in | Wt 211.8 lb

## 2010-12-06 DIAGNOSIS — J209 Acute bronchitis, unspecified: Secondary | ICD-10-CM

## 2010-12-06 DIAGNOSIS — J309 Allergic rhinitis, unspecified: Secondary | ICD-10-CM

## 2010-12-06 MED ORDER — MOXIFLOXACIN HCL 400 MG PO TABS: 400.0000 mg | ORAL_TABLET | Freq: Every day | ORAL | Status: DC

## 2010-12-06 NOTE — Assessment & Plan Note (Signed)
Recurrent green sputum - poor immune response from prior CLL tx predisposes to infx Avelox x 7 day = erx done; declines rx cough med in favor of OTC tx Continue to work with pulm/allergist on other underlying issues as ongoing

## 2010-12-06 NOTE — Assessment & Plan Note (Signed)
The current medical regimen is effective;  continue present plan and medications.  Follow up dr. Maple Hudson as planned

## 2010-12-06 NOTE — Progress Notes (Signed)
  Subjective:    Patient ID: Bianca Parker, female    DOB: 1945-10-05, 65 y.o.   MRN: [id number]  HPI nasal and bronchial congestion x 48h - clear nasal discharge, turned green and left maxillary/tooth pressure associated with cough and green sputum and hoarseness no fever, compliant with meds for allg rhinitis symptoms worse at night - symptoms imporved with otc meds (mucinex d) but not resolving symptoms  Reviewed recent interval hx R TM rupture 10/24/10 from OM, prolonged 18d course abx tx with abx changes to resolve same (biaxin, cefinidir)  chronic medical issues also reviewed: CLL - in remission - dx 2002 and tx 12/2001 with clinical trial at [facility] - follows with local onc every 4-6 months - poor "immune system response" due to same  allg rhinitis/sinusitus - freq flares and recurrent infx in past - reports compliance with ongoing medical treatment and no changes in medication dose or frequency. denies adverse side effects related to current therapy. follows with ENT and allergist for same  hx squam cell cancer, skin - 4 areas excised positive for ca - sees derm 3-4x/year - no current skin changes -  Past Medical History  Diagnosis Date  . Anemia   . CARCINOMA, SKIN, SQUAMOUS CELL   . Allergy   . Chronic lymphoid leukemia in remission 2002 dx, tx 2003    Review of Systems  Constitutional: Positive for chills. Negative for appetite change and unexpected weight change.  Eyes: Negative for itching.  Respiratory: Positive for cough and shortness of breath. Negative for wheezing and stridor.   Cardiovascular: Negative for chest pain and palpitations.       Objective:   Physical Exam BP 110/80  Pulse 75  Temp(Src) 98.3 F (36.8 C) (Oral)  Ht 5' 4.5" (1.638 m)  Wt 211 lb 12.8 oz (96.072 kg)  BMI 35.79 kg/m2  SpO2 94% Physical Exam  Constitutional: She is oriented to person, place, and time. She appears well-developed and well-nourished. No distress.  HENT: Head:  Normocephalic and atraumatic. Nose: Nose normal. Mouth/Throat: Oropharynx is clear and moist, moderate erythema. No oropharyngeal exudate. Ears: ruptured R TM, no drainage or erythema, L TM normal Eyes: Conjunctivae and EOM are normal. Pupils are equal, round, and reactive to light. No scleral icterus.  Neck: Normal range of motion. Neck supple. No JVD present. No thyromegaly present.  Cardiovascular: Normal rate, regular rhythm and normal heart sounds.  No murmur heard. Pulmonary/Chest: Effort normal and breath sounds with scattered rhonchi. No respiratory distress. She has no wheezes.  Neurological: She is alert and oriented to person, place, and time. No cranial nerve deficit. Coordination normal.  Skin: Skin is warm and dry. No rash noted. No erythema.  Psychiatric: She has a normal mood and affect. Her behavior is normal. Judgment and thought content normal.   Lab Results  Component Value Date   WBC 5.4 05/17/2010   HGB 14.4 11/08/2010   HCT 42.6 11/08/2010   PLT 203 11/08/2010   ALT 62* 07/09/2010   AST 43* 07/09/2010   NA 140 07/09/2010   K 4.2 07/09/2010   CL 105 07/09/2010   CREATININE 1.04 07/09/2010   BUN 14 07/09/2010   CO2 23 07/09/2010       Assessment & Plan:  See problem list. Medications and labs reviewed today.

## 2010-12-06 NOTE — Patient Instructions (Signed)
It was good to see you today. We have reviewed your interval history today Avelox antibiotics - Your prescription(s) have been submitted to your pharmacy. Please take as directed and contact our office if you believe you are having problem(s) with the medication(s). Continue Mucinex DM as ongoing - call if symptoms worse or unimproved

## 2010-12-25 ENCOUNTER — Encounter: Payer: Self-pay | Admitting: Internal Medicine

## 2010-12-25 ENCOUNTER — Ambulatory Visit (INDEPENDENT_AMBULATORY_CARE_PROVIDER_SITE_OTHER): Payer: 59 | Admitting: Internal Medicine

## 2010-12-25 VITALS — BP 126/80 | HR 75 | Ht 64.5 in | Wt 205.0 lb

## 2010-12-25 DIAGNOSIS — J309 Allergic rhinitis, unspecified: Secondary | ICD-10-CM

## 2010-12-25 DIAGNOSIS — J328 Other chronic sinusitis: Secondary | ICD-10-CM

## 2010-12-25 DIAGNOSIS — J301 Allergic rhinitis due to pollen: Secondary | ICD-10-CM

## 2010-12-25 NOTE — Progress Notes (Signed)
  Subjective:    Patient ID: Bianca Parker, female    DOB: 01/16/1946, 65 y.o.   MRN: [id number]  HPI 80 yoF followed for Allergic rhinitis, chronic rhinosinusitis. Hx chemoRx for CLL in remission.  Last here- March 9. She responded to antibiotic, but then got another cold clearing more quickly. Saw Dr Ezzard Standing- recognized "congestion" left nostril. Denies infection now. Catches colds very quickly from her grandchild.  Coming today off antihistamines for allergy testing. Demonstrated immunoglobulin deficiency as of 6/ 2011, residual from her chemotherapy of 8-9 years ago, reviewed.  She has encasings and HEPA filter. Allergy profile- 11/01/10- IgE < 1.5; no specific elevations. Also low IgG for penicillin Skin testing- weak positive intradermal reactions to common inhalants.    Review of Systems     Objective:   Physical Exam General- Alert, Oriented, Affect-appropriate, Distress- none acute   Overweight- well looking  Skin- rash-none, lesions- none, excoriation- none  Lymphadenopathy- none  Head- atraumatic  Eyes- Gross vision intact, PERRLA, conjunctivae clear, secretions  Ears- Normal- hearing, canals,    Nose- Clear,  No- Septal dev, mucus, polyps, erosion, perforation   Throat- Mallampati II , mucosa clear , drainage- none, tonsils- atrophic  Neck- flexible , trachea midline, no stridor , thyroid nl, carotid no bruit  Chest - symmetrical excursion , unlabored     Heart/CV- RRR , no murmur , no gallop  , no rub, nl s1 s2                     - JVD- none , edema- none, stasis changes- none, varices- none     Lung- clear to P&A, wheeze- none, cough- none , dullness-none, rub- none     Chest wall-   Abd- tender-no, distended-no, bowel sounds-present, HSM- no  Br/ Gen/ Rectal- Not done, not indicated  Extrem- cyanosis- none, clubbing, none, atrophy- none, strength- nl  Neuro- grossly intact to observation        Assessment & Plan:

## 2010-12-25 NOTE — Patient Instructions (Signed)
Allergy vaccine is an available option if allergies are important. Try Allegra 180/ fexofenadine as an alternative to zyrtec as an antihistamine   As we discussed- barrier protections against virus infections- like gloves and masks, and lots of handwashing- are the protection against colds.

## 2010-12-29 NOTE — Assessment & Plan Note (Signed)
There may be some mild allergic component to her respiratory symptoms at times, although most of her problem is immuno-deficiency. Environmental precautions and use of antihistamines and nasal saline will be best treatment.

## 2010-12-29 NOTE — Assessment & Plan Note (Signed)
She is vulnerable to recurrent infections due to immune suppression. Educated again on prevention, especially related to contact with grandchildren.

## 2011-01-03 ENCOUNTER — Encounter: Payer: Self-pay | Admitting: Internal Medicine

## 2011-02-24 ENCOUNTER — Ambulatory Visit (INDEPENDENT_AMBULATORY_CARE_PROVIDER_SITE_OTHER): Payer: 59 | Admitting: Internal Medicine

## 2011-02-24 ENCOUNTER — Encounter: Payer: Self-pay | Admitting: Internal Medicine

## 2011-02-24 VITALS — BP 114/62 | HR 75 | Ht 64.5 in | Wt 187.8 lb

## 2011-02-24 DIAGNOSIS — J209 Acute bronchitis, unspecified: Secondary | ICD-10-CM

## 2011-02-24 DIAGNOSIS — J309 Allergic rhinitis, unspecified: Secondary | ICD-10-CM

## 2011-02-24 DIAGNOSIS — J328 Other chronic sinusitis: Secondary | ICD-10-CM

## 2011-02-24 NOTE — Progress Notes (Signed)
Subjective:    Patient ID: Bianca Parker, female    DOB: 04/14/1946, 65 y.o.   MRN: [id number]  HPI 5/2HPI  44 yoF followed for Allergic rhinitis, chronic rhinosinusitis. Hx chemoRx for CLL in remission.  Last here- March 9. She responded to antibiotic, but then got another cold clearing more quickly. Saw Dr Ezzard Standing- recognized "congestion" left nostril.  Denies infection now. Catches colds very quickly from her grandchild.  Coming today off antihistamines for allergy testing.  Demonstrated immunoglobulin deficiency as of 6/ 2011, residual from her chemotherapy of 8-9 years ago, reviewed.  She has encasings and HEPA filter.  Allergy profile- 11/01/10- IgE < 1.5; no specific elevations. Also low IgG for penicillin  Skin testing- weak positive intradermal reactions to common inhalants.   02/24/11- 65 yoF followed for Allergic rhinitis, chronic rhinosinusitis. Hx chemoRx for CLL in remission.  Since last here she has done fairly well with fexofenadine. She has had one "sinus infection" since last here. We talked again about non-bacterial sinusitis. She emphases that she gets fever, bed-ridden malaise. Very concerned about her host resistance because of her leukemia hx. We discussed how to culture, risks of allergy and resistance with frequent antibiotics.  Today she feels well- 2 months without an infection. Realizes risks with exposure to her gransdson's virus colds.   Review of Systems Constitutional:   No weight loss, night sweats,  Fevers, chills, fatigue, lassitude. HEENT:   No headaches,  Difficulty swallowing,  Tooth/dental problems,  Sore throat,               Currently-  No sneezing, itching, ear ache, nasal congestion, post nasal drip,   CV:  No chest pain,  Orthopnea, PND, swelling in lower extremities, anasarca, dizziness, palpitations  GI  No heartburn, indigestion, abdominal pain, nausea, vomiting, diarrhea, change in bowel habits, loss of appetite  Resp: No shortness of breath  with exertion or at rest.  No excess mucus, no productive cough,  No non-productive cough,  No coughing up of blood.  No change in color of mucus.  No wheezing.    Skin: no rash or lesions.  GU: no dysuria, change in color of urine, no urgency or frequency.  No flank pain.  MS:  No joint pain or swelling.  No decreased range of motion.  No back pain.  Psych:  No change in mood or affect. No depression or anxiety.  No memory loss.       Objective:   Physical Exam General- Alert, Oriented, Affect-appropriate, Distress- none acute  Skin- rash-none, lesions- none, excoriation- none  Lymphadenopathy- none  Head- atraumatic  Eyes- Gross vision intact, PERRLA, conjunctivae clear secretions  Ears- Hearing, canals, Tm- normal  Nose- Clear, No- Septal dev, mucus, polyps, erosion, perforation   Throat- Mallampati II , mucosa clear , drainage- none, tonsils- atrophic  Neck- flexible , trachea midline, no stridor , thyroid nl, carotid no bruit  Chest - symmetrical excursion , unlabored     Heart/CV- RRR , no murmur , no gallop  , no rub, nl s1 s2                     - JVD- none , edema- none, stasis changes- none, varices- none     Lung- clear to P&A, wheeze- none, cough- none , dullness-none, rub- none     Chest wall-  Abd- tender-no, distended-no, bowel sounds-present, HSM- no  Br/ Gen/ Rectal- Not done, not indicated  Extrem- cyanosis- none, clubbing,  none, atrophy- none, strength- nl  Neuro- grossly intact to observation         Assessment & Plan:

## 2011-02-24 NOTE — Assessment & Plan Note (Signed)
At risk for recurrent infections. She wil try to call us and bring in nasal discharge in saran wrap for cultures, before we give antibiotics, but we won't need to defer treatment waiting for results.  Also to use Neti pot at first hint of impending infection.

## 2011-02-24 NOTE — Assessment & Plan Note (Signed)
She will continue to use fexofenadine as needed

## 2011-02-24 NOTE — Assessment & Plan Note (Signed)
She will work with [place] to try to get cultures when she gets sick.

## 2011-02-24 NOTE — Patient Instructions (Signed)
Use Neti pot saline rise at first sign of impending sinus infection. We can work to submit a culture- blow nose into saran wrap- if you call us early.

## 2011-03-11 ENCOUNTER — Encounter (HOSPITAL_BASED_OUTPATIENT_CLINIC_OR_DEPARTMENT_OTHER): Payer: 59 | Admitting: Oncology

## 2011-03-11 ENCOUNTER — Other Ambulatory Visit: Payer: Self-pay | Admitting: Oncology

## 2011-03-11 DIAGNOSIS — C9111 Chronic lymphocytic leukemia of B-cell type in remission: Secondary | ICD-10-CM

## 2011-03-11 LAB — CBC WITH DIFFERENTIAL/PLATELET
Basophils Absolute: 0 10*3/uL (ref 0.0–0.1)
Eosinophils Absolute: 0 10*3/uL (ref 0.0–0.5)
HCT: 43 % (ref 34.8–46.6)
HGB: 14.9 g/dL (ref 11.6–15.9)
MCV: 91.3 fL (ref 79.5–101.0)
MONO%: 6.8 % (ref 0.0–14.0)
NEUT#: 2.5 10*3/uL (ref 1.5–6.5)
RDW: 13.1 % (ref 11.2–14.5)

## 2011-03-11 LAB — MORPHOLOGY

## 2011-03-25 ENCOUNTER — Encounter (HOSPITAL_BASED_OUTPATIENT_CLINIC_OR_DEPARTMENT_OTHER): Payer: 59 | Admitting: Oncology

## 2011-03-25 ENCOUNTER — Other Ambulatory Visit: Payer: Self-pay | Admitting: Oncology

## 2011-03-25 DIAGNOSIS — C9111 Chronic lymphocytic leukemia of B-cell type in remission: Secondary | ICD-10-CM

## 2011-03-25 LAB — CBC WITH DIFFERENTIAL/PLATELET
Eosinophils Absolute: 0 10*3/uL (ref 0.0–0.5)
MONO#: 0.4 10*3/uL (ref 0.1–0.9)
NEUT#: 3.1 10*3/uL (ref 1.5–6.5)
Platelets: 130 10*3/uL — ABNORMAL LOW (ref 145–400)
RBC: 4.89 10*6/uL (ref 3.70–5.45)
RDW: 13.5 % (ref 11.2–14.5)
WBC: 5.4 10*3/uL (ref 3.9–10.3)

## 2011-03-26 LAB — IGG, IGA, IGM
IgA: <5 mg/dL — ABNORMAL LOW (ref 69–380)
IgG (Immunoglobin G), Serum: 160 mg/dL — ABNORMAL LOW (ref 690–1700)
IgM, Serum: 4 mg/dL — ABNORMAL LOW (ref 52–322)

## 2011-04-08 ENCOUNTER — Other Ambulatory Visit: Payer: Self-pay | Admitting: Dermatology

## 2011-04-22 ENCOUNTER — Encounter (HOSPITAL_BASED_OUTPATIENT_CLINIC_OR_DEPARTMENT_OTHER): Payer: 59 | Admitting: Oncology

## 2011-04-22 ENCOUNTER — Other Ambulatory Visit: Payer: Self-pay | Admitting: Oncology

## 2011-04-22 DIAGNOSIS — C9111 Chronic lymphocytic leukemia of B-cell type in remission: Secondary | ICD-10-CM

## 2011-04-22 LAB — CBC WITH DIFFERENTIAL/PLATELET
Basophils Absolute: 0 10*3/uL (ref 0.0–0.1)
Eosinophils Absolute: 0 10*3/uL (ref 0.0–0.5)
HGB: 15.3 g/dL (ref 11.6–15.9)
MONO#: 0.4 10*3/uL (ref 0.1–0.9)
NEUT#: 3.1 10*3/uL (ref 1.5–6.5)
Platelets: 133 10*3/uL — ABNORMAL LOW (ref 145–400)
RBC: 4.96 10*6/uL (ref 3.70–5.45)
RDW: 13.3 % (ref 11.2–14.5)
WBC: 5.6 10*3/uL (ref 3.9–10.3)
nRBC: 0 % (ref 0–0)

## 2011-04-22 LAB — MORPHOLOGY
PLT EST: DECREASED
RBC Comments: NORMAL

## 2011-05-05 ENCOUNTER — Other Ambulatory Visit: Payer: Self-pay | Admitting: Internal Medicine

## 2011-05-05 DIAGNOSIS — Z1231 Encounter for screening mammogram for malignant neoplasm of breast: Secondary | ICD-10-CM

## 2011-05-07 ENCOUNTER — Encounter: Payer: Self-pay | Admitting: Internal Medicine

## 2011-05-15 ENCOUNTER — Telehealth: Payer: Self-pay | Admitting: *Deleted

## 2011-05-15 DIAGNOSIS — Z0000 Encounter for general adult medical examination without abnormal findings: Secondary | ICD-10-CM

## 2011-05-15 NOTE — Telephone Encounter (Signed)
Received staff msg pt is coming in 05/20/11 for cpx labs need labs entered in EPIC.Marland KitchenMarland Kitchen9/20/12@2 :41pm/LMB

## 2011-05-20 ENCOUNTER — Other Ambulatory Visit: Payer: Self-pay | Admitting: Internal Medicine

## 2011-05-20 ENCOUNTER — Other Ambulatory Visit (INDEPENDENT_AMBULATORY_CARE_PROVIDER_SITE_OTHER): Payer: 59

## 2011-05-20 DIAGNOSIS — Z0000 Encounter for general adult medical examination without abnormal findings: Secondary | ICD-10-CM

## 2011-05-20 LAB — URINALYSIS, ROUTINE W REFLEX MICROSCOPIC
Ketones, ur: NEGATIVE
Specific Gravity, Urine: 1.015 (ref 1.000–1.030)
Total Protein, Urine: NEGATIVE
Urine Glucose: NEGATIVE
pH: 6.5 (ref 5.0–8.0)

## 2011-05-20 LAB — CBC WITH DIFFERENTIAL/PLATELET
Basophils Relative: 0.3 % (ref 0.0–3.0)
Eosinophils Relative: 1.1 % (ref 0.0–5.0)
Hemoglobin: 15.1 g/dL — ABNORMAL HIGH (ref 12.0–15.0)
Lymphocytes Relative: 38.3 % (ref 12.0–46.0)
MCV: 93.2 fl (ref 78.0–100.0)
Monocytes Absolute: 0.3 10*3/uL (ref 0.1–1.0)
Neutrophils Relative %: 52 % (ref 43.0–77.0)
RBC: 4.88 Mil/uL (ref 3.87–5.11)
WBC: 3.9 10*3/uL — ABNORMAL LOW (ref 4.5–10.5)

## 2011-05-20 LAB — HEPATIC FUNCTION PANEL
ALT: 35 U/L (ref 0–35)
AST: 34 U/L (ref 0–37)
Alkaline Phosphatase: 54 U/L (ref 39–117)
Bilirubin, Direct: 0.1 mg/dL (ref 0.0–0.3)
Total Bilirubin: 1 mg/dL (ref 0.3–1.2)

## 2011-05-20 LAB — BASIC METABOLIC PANEL
Chloride: 109 mEq/L (ref 96–112)
Creatinine, Ser: 0.9 mg/dL (ref 0.4–1.2)
Sodium: 145 mEq/L (ref 135–145)

## 2011-05-20 LAB — LIPID PANEL: Total CHOL/HDL Ratio: 4

## 2011-05-22 ENCOUNTER — Encounter: Payer: Self-pay | Admitting: Internal Medicine

## 2011-05-22 ENCOUNTER — Ambulatory Visit (INDEPENDENT_AMBULATORY_CARE_PROVIDER_SITE_OTHER): Payer: 59 | Admitting: Internal Medicine

## 2011-05-22 VITALS — BP 110/72 | HR 78 | Temp 98.3°F | Ht 64.0 in | Wt 170.4 lb

## 2011-05-22 DIAGNOSIS — N390 Urinary tract infection, site not specified: Secondary | ICD-10-CM

## 2011-05-22 DIAGNOSIS — Z124 Encounter for screening for malignant neoplasm of cervix: Secondary | ICD-10-CM

## 2011-05-22 DIAGNOSIS — C9111 Chronic lymphocytic leukemia of B-cell type in remission: Secondary | ICD-10-CM

## 2011-05-22 DIAGNOSIS — E785 Hyperlipidemia, unspecified: Secondary | ICD-10-CM

## 2011-05-22 DIAGNOSIS — Z0000 Encounter for general adult medical examination without abnormal findings: Secondary | ICD-10-CM

## 2011-05-22 MED ORDER — CIPROFLOXACIN HCL 500 MG PO TABS: 500.0000 mg | ORAL_TABLET | Freq: Two times a day (BID) | ORAL | Status: AC

## 2011-05-22 MED ORDER — OMEGA 3 1000 MG PO CAPS: 1000.0000 mg | ORAL_CAPSULE | Freq: Two times a day (BID) | ORAL | Status: DC

## 2011-05-22 NOTE — Patient Instructions (Signed)
It was good to see you today. We have reviewed your prior records including labs and tests today congrats on the lifestyle change and weight loss - keep up same efforts to control cholesterol without medications (if possible) Medications reviewed, no prescription changes at this time, but ok to resume omega 3 for cholesterol and other issues. Cipro prescription for UTI as discussed (given to you) Please schedule followup in 6 months to recheck cholesterol, call sooner if problems.

## 2011-05-22 NOTE — Assessment & Plan Note (Signed)
CBC counts reviewed - follows with onc ([facility]) regularly

## 2011-05-22 NOTE — Progress Notes (Signed)
Subjective:    Patient ID: Bianca Parker, female    DOB: 1946/01/18, 65 y.o.   MRN: [id number]  HPI  patient is here today for annual physical. Patient feels well and has no complaints.  chronic medical issues also reviewed: CLL - in remission - dx 2002 and tx 12/2001 with clinical trial at [facility] - follows with local onc every 4-6 months - poor "immune system response" due to same  allg rhinitis/sinusitus - freq flares and recurrent infx in past - reports compliance with ongoing medical treatment and no changes in medication dose or frequency. denies adverse side effects related to current therapy. follows with ENT and allergist for same  hx squam cell cancer, skin - 4 areas excised positive for ca - sees derm 3-4x/year - no current skin changes   Past Medical History  Diagnosis Date  . Anemia   . CARCINOMA, SKIN, SQUAMOUS CELL   . Allergy   . Chronic lymphoid leukemia in remission 2002 dx, tx 2003   Family History  Problem Relation Age of Onset  . Colon cancer Mother   . Breast cancer Other   . Ovarian cancer Other    History  Substance Use Topics  . Smoking status: Never Smoker   . Smokeless tobacco: Never Used   Comment: Married, lives with husband [name]), grown children. 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 Alcohol Use: Yes     Rare     Review of Systems Constitutional: Negative for fever.  Respiratory: Negative for cough and shortness of breath.   Cardiovascular: Negative for chest pain.  Gastrointestinal: Negative for abdominal pain.  Musculoskeletal: Negative for gait problem.  Skin: Negative for rash.  Neurological: Negative for dizziness.  No other specific complaints in a complete review of systems (except as listed in HPI above).     Objective:   Physical Exam  There were no vitals taken for this visit. Wt Readings from Last 3 Encounters:  05/22/11 170 lb 6.4 oz (77.293 kg)  02/24/11 187 lb 12.8 oz (85.186 kg)  12/25/10 205 lb (92.987  kg)    Constitutional: She is mildly overweight;  well-developed and well-nourished. No distress.  HENT: Head: Normocephalic and atraumatic. Nose: Nose normal. Mouth/Throat: Oropharynx is clear and moist, moderate erythema. No oropharyngeal exudate. Ears: B TMs normal Eyes: Conjunctivae and EOM are normal. Pupils are equal, round, and reactive to light. No scleral icterus.  Neck: Normal range of motion. Neck supple. No JVD present. No thyromegaly present.  Cardiovascular: Normal rate, regular rhythm and normal heart sounds.  No murmur heard. Pulmonary/Chest: Effort normal and breath sounds with scattered rhonchi. No respiratory distress. She has no wheezes.  Abd: SNTND +BS, no R/G, no mass GU: defer to gyn Neurological: She is alert and oriented to person, place, and time. No cranial nerve deficit. Coordination normal.  Skin: Skin is warm and dry. No rash noted. No erythema.  Psychiatric: She has a normal mood and affect. Her behavior is normal. Judgment and thought content normal.   Lab Results  Component Value Date   WBC 3.9* 05/20/2011   HGB 15.1* 05/20/2011   HCT 45.5 05/20/2011   PLT 134.0* 05/20/2011   CHOL 253* 05/20/2011   TRIG 73.0 05/20/2011   HDL 66.50 05/20/2011   LDLDIRECT 167.3 05/20/2011   ALT 35 05/20/2011   AST 34 05/20/2011   NA 145 05/20/2011   K 4.7 05/20/2011   CL 109 05/20/2011   CREATININE 0.9 05/20/2011   BUN 15  05/20/2011   CO2 28 05/20/2011   TSH 4.37 05/20/2011   EKG: NSR @ 80bpm, no ST-T changes     Assessment & Plan:   CPX - v70.0 - Patient has been counseled on age-appropriate routine health concerns for screening and prevention. These are reviewed and up-to-date. Immunizations are up-to-date or declined. Labs and ECG reviewed.  Also see problem list. Medications and labs reviewed today.  UTI - mild symptoms but +UA - rx for cipro provided

## 2011-05-22 NOTE — Assessment & Plan Note (Signed)
Has intentionally worked on weight loss with healthy diet - but still new dx (04/2011) No personal or FH CAD or CVA events No other cardiac risk factor dx personally Continue life style changes, resume omega 3 and will recheck in 6 months

## 2011-06-16 ENCOUNTER — Ambulatory Visit
Admission: RE | Admit: 2011-06-16 | Discharge: 2011-06-16 | Disposition: A | Discharge status: Home / Self Care | Payer: 59 | Source: Ambulatory Visit | Attending: Internal Medicine | Admitting: Internal Medicine

## 2011-06-16 DIAGNOSIS — Z1231 Encounter for screening mammogram for malignant neoplasm of breast: Secondary | ICD-10-CM

## 2011-06-18 ENCOUNTER — Other Ambulatory Visit: Payer: Self-pay | Admitting: Internal Medicine

## 2011-06-18 DIAGNOSIS — R928 Other abnormal and inconclusive findings on diagnostic imaging of breast: Secondary | ICD-10-CM

## 2011-06-24 ENCOUNTER — Telehealth: Payer: Self-pay

## 2011-06-24 NOTE — Telephone Encounter (Signed)
Pt advised and is now comfortable waiting until 11/09 for f/u appt.

## 2011-06-24 NOTE — Telephone Encounter (Signed)
I reviewed the report, but then sent it for scan into our EMR yesterday- not yet available in EMR to re-review - I believe the abnormality was a very minor density change as compared to prior mammogram. Further testing is appropriate to exclude bigger problems, and yes, okay to wait till November 9, no need for a different facility. This would further complicate matters (different machines, etc) rather than expedite the process. Thanks

## 2011-06-24 NOTE — Telephone Encounter (Signed)
Pt called stating she had mammogram done and was told to return for further testing because the result was abnormal. Pt was told first available appt is not until 11/09. She is requesting MD 1:advise as to what was found on mammogram and 2:is she okay to wait until 11/09 or can she be referred to another facility for earlier follow up. Please advise, pt was very worried about mammogram findings.

## 2011-06-26 ENCOUNTER — Ambulatory Visit
Admission: RE | Admit: 2011-06-26 | Discharge: 2011-06-26 | Disposition: A | Discharge status: Home / Self Care | Payer: 59 | Source: Ambulatory Visit | Attending: Internal Medicine | Admitting: Internal Medicine

## 2011-06-26 DIAGNOSIS — R928 Other abnormal and inconclusive findings on diagnostic imaging of breast: Secondary | ICD-10-CM

## 2011-07-01 ENCOUNTER — Encounter: Payer: Self-pay | Admitting: Internal Medicine

## 2011-07-01 ENCOUNTER — Ambulatory Visit (INDEPENDENT_AMBULATORY_CARE_PROVIDER_SITE_OTHER): Payer: 59 | Admitting: Internal Medicine

## 2011-07-01 VITALS — BP 120/82 | HR 64 | Ht 64.5 in | Wt 167.6 lb

## 2011-07-01 DIAGNOSIS — J328 Other chronic sinusitis: Secondary | ICD-10-CM

## 2011-07-01 DIAGNOSIS — C9111 Chronic lymphocytic leukemia of B-cell type in remission: Secondary | ICD-10-CM

## 2011-07-01 MED ORDER — DOXYCYCLINE HYCLATE 100 MG PO TABS: ORAL_TABLET | ORAL | Status: DC

## 2011-07-01 NOTE — Progress Notes (Signed)
Subjective:    Patient ID: Bianca Parker, female    DOB: 1946/03/09, 65 y.o.   MRN: [id number]  HPI 5/2HPI  63 yoF followed for Allergic rhinitis, chronic rhinosinusitis. Hx chemoRx for CLL in remission.  Last here- March 9. She responded to antibiotic, but then got another cold clearing more quickly. Saw Dr Ezzard Standing- recognized "congestion" left nostril.  Denies infection now. Catches colds very quickly from her grandchild.  Coming today off antihistamines for allergy testing.  Demonstrated immunoglobulin deficiency as of 6/ 2011, residual from her chemotherapy of 8-9 years ago, reviewed.  She has encasings and HEPA filter.  Allergy profile- 11/01/10- IgE < 1.5; no specific elevations. Also low IgG for penicillin  Skin testing- weak positive intradermal reactions to common inhalants.   02/24/11- 65 yoF followed for Allergic rhinitis, chronic rhinosinusitis. Hx chemoRx for CLL in remission Since last here she has done fairly well with fexofenadine. She has had one "sinus infection" since last here. We talked again about non-bacterial sinusitis. She emphases that she gets fever, bed-ridden malaise. Very concerned about her host resistance because of her leukemia hx. We discussed how to culture, risks of allergy and resistance with frequent antibiotics.  Today she feels well- 2 months without an infection. Realizes risks with exposure to her gransdson's virus colds.   07/01/11-  65 yoF followed for Allergic rhinitis, chronic rhinosinusitis. Hx chemoRx for CLL in remission/ Dr Cyndie Chime Has had flu vaccine. A cold  has spread to her family. She started herself with saline nasal rinse using boiled well water. Got Zicam lozenges and thinks she fought off the cold. Takes probiotics routinely. Her CLL may not be in remission now. Neutrophils and platelets are down. 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   Review of Systems- see HPI Constitutional:   No-   weight loss, night  sweats, fevers, chills, fatigue, lassitude. HEENT:   No-  headaches, difficulty swallowing, tooth/dental problems, sore throat,       No-  sneezing, itching, ear ache, nasal congestion, post nasal drip,  CV:  No-   chest pain, orthopnea, PND, swelling in lower extremities, anasarca, dizziness, palpitations Resp: No-   shortness of breath with exertion or at rest.              No-   productive cough,  No non-productive cough,  No- coughing up of blood.              No-   change in color of mucus.  No- wheezing.   Skin: No-   rash or lesions. GI:  No-   heartburn, indigestion, abdominal pain, nausea, vomiting, diarrhea,                 change in bowel habits, loss of appetite GU: No-   dysuria, change in color of urine, no urgency or frequency.  No- flank pain. MS:  No-   joint pain or swelling.  No- decreased range of motion.  No- back pain. Neuro-     nothing unusual Psych:  No- change in mood or affect. No depression or anxiety.  No memory loss.  Objective:   Physical Exam General- Alert, Oriented, Affect-appropriate, Distress- none acute; well appearing Skin- rash-none, lesions- none, excoriation- none Lymphadenopathy- none Head- atraumatic            Eyes- Gross vision intact, PERRLA, conjunctivae clear secretions            Ears- Hearing, canals-normal  Nose- Clear, no-Septal dev, mucus, polyps, erosion, perforation             Throat- Mallampati II , mucosa clear , drainage- none, tonsils- atrophic Neck- flexible , trachea midline, no stridor , thyroid nl, carotid no bruit Chest - symmetrical excursion , unlabored           Heart/CV- RRR , no murmur , no gallop  , no rub, nl s1 s2                           - JVD- none , edema- none, stasis changes- none, varices- none           Lung- clear to P&A, wheeze- none, cough- none , dullness-none, rub- none           Chest wall-  Abd- tender-no, distended-no, bowel sounds-present, HSM- no Br/ Gen/ Rectal- Not done, not  indicated Extrem- cyanosis- none, clubbing, none, atrophy- none, strength- nl Neuro- grossly intact to observation

## 2011-07-01 NOTE — Patient Instructions (Addendum)
Script to hold for antibiotic  Consider keeping simple dust and pollen mask and some Zicam at home for use as needed  We can start a culture of your coughed up or blown out mucus if needed- please call as needed

## 2011-07-04 ENCOUNTER — Other Ambulatory Visit: Payer: 59

## 2011-07-04 NOTE — Assessment & Plan Note (Signed)
She feels vulnerable to routine community infections such as viral head cold caught from family members. We discussed communication with family especially during holiday visits, uses masks, handwashing and prophylactic use of zinc lozenges which she has some confidence in.

## 2011-07-04 NOTE — Assessment & Plan Note (Signed)
Most of our effort is with prevention. I am letting her hold a prescription for doxycycline to start early in case of new infection.

## 2011-07-15 ENCOUNTER — Other Ambulatory Visit: Payer: Self-pay | Admitting: Oncology

## 2011-07-15 ENCOUNTER — Telehealth: Payer: Self-pay | Admitting: *Deleted

## 2011-07-15 ENCOUNTER — Other Ambulatory Visit (HOSPITAL_BASED_OUTPATIENT_CLINIC_OR_DEPARTMENT_OTHER): Payer: 59 | Admitting: Lab

## 2011-07-15 DIAGNOSIS — C9111 Chronic lymphocytic leukemia of B-cell type in remission: Secondary | ICD-10-CM

## 2011-07-15 LAB — CBC WITH DIFFERENTIAL/PLATELET
Basophils Absolute: 0 10*3/uL (ref 0.0–0.1)
EOS%: 1.1 % (ref 0.0–7.0)
HCT: 42.3 % (ref 34.8–46.6)
HGB: 14.3 g/dL (ref 11.6–15.9)
LYMPH%: 30 % (ref 14.0–49.7)
MCH: 31.4 pg (ref 25.1–34.0)
MCV: 93 fL (ref 79.5–101.0)
MONO%: 6.3 % (ref 0.0–14.0)
NEUT%: 62.4 % (ref 38.4–76.8)
RDW: 14.1 % (ref 11.2–14.5)

## 2011-07-15 NOTE — Telephone Encounter (Signed)
Pt. Notified per Dr. Cyndie Chime that recent labs good.

## 2011-07-22 ENCOUNTER — Encounter: Payer: Self-pay | Admitting: *Deleted

## 2011-07-22 NOTE — Progress Notes (Signed)
Copy of lab done 07/15/11 sent electronically to Dr. Willey Blade per Dr. Cyndie Chime.

## 2011-08-06 ENCOUNTER — Telehealth: Payer: Self-pay | Admitting: Oncology

## 2011-08-06 NOTE — Telephone Encounter (Signed)
pt aware of appt for 09/08/11/aom

## 2011-08-20 ENCOUNTER — Ambulatory Visit: Payer: 59

## 2011-08-20 ENCOUNTER — Other Ambulatory Visit: Payer: Self-pay | Admitting: Allergy

## 2011-08-20 ENCOUNTER — Telehealth: Payer: Self-pay | Admitting: Internal Medicine

## 2011-08-20 DIAGNOSIS — J329 Chronic sinusitis, unspecified: Secondary | ICD-10-CM

## 2011-08-20 NOTE — Telephone Encounter (Signed)
Per CDY: okay to start to doxycycline.  Called spoke with patient, advised CDY okayed for her to start to doxycycline.  Pt verbalized her understanding.

## 2011-08-20 NOTE — Telephone Encounter (Signed)
Pt says her husband was dropping off a sputum sample at the lab this morning and wants to know if she needs to start on the Doxycycline or wait for culture results. She says she started with sinus congestion and headaches 4 days ago and sputum is green. Also hoarseness and cough. She denies any increased sob, wheezing or fever. Throat has been sore as well. Pls advise. Allergies  Allergen Reactions  . Penicillins   . Sulfonamide Derivatives

## 2011-08-22 ENCOUNTER — Telehealth: Payer: Self-pay | Admitting: Internal Medicine

## 2011-08-22 NOTE — Telephone Encounter (Signed)
I spoke with pt and is requesting her culture results. Per Florentina Addison it can take up to 4-6 weeks to get these back depending on if it is growing anything. I made pt aware this and voiced her understanding

## 2011-08-23 LAB — RESPIRATORY CULTURE OR RESPIRATORY AND SPUTUM CULTURE
Gram Stain: NONE SEEN
Organism ID, Bacteria: NORMAL

## 2011-08-25 NOTE — Progress Notes (Signed)
Quick Note:  Pt aware of results. She states she is doing better; phlegm has went to clear form green in color and has her voice back. ______

## 2011-09-08 ENCOUNTER — Ambulatory Visit: Payer: 59 | Admitting: Oncology

## 2011-09-08 ENCOUNTER — Other Ambulatory Visit: Payer: 59 | Admitting: Lab

## 2011-09-11 ENCOUNTER — Telehealth: Payer: Self-pay | Admitting: Internal Medicine

## 2011-09-11 MED ORDER — DOXYCYCLINE HYCLATE 100 MG PO TABS: ORAL_TABLET | ORAL | Status: DC

## 2011-09-11 NOTE — Telephone Encounter (Signed)
Pt used her rx for Doxycycline last week and would like to have another one sent to pharmacy to put on hold for the future.  Please advise if ok.

## 2011-09-11 NOTE — Telephone Encounter (Signed)
Per CY-okay to refill x 3 as requested.

## 2011-09-11 NOTE — Telephone Encounter (Signed)
Spoke with pt and notified recs per CDY Rx was sent to pharm

## 2011-10-15 ENCOUNTER — Other Ambulatory Visit: Payer: Self-pay | Admitting: Dermatology

## 2011-10-20 ENCOUNTER — Telehealth: Payer: Self-pay | Admitting: Internal Medicine

## 2011-10-20 MED ORDER — AZITHROMYCIN 250 MG PO TABS: ORAL_TABLET | ORAL | Status: AC

## 2011-10-20 NOTE — Telephone Encounter (Signed)
Per CY ok To call in a Zpak Pt aware  Sent [facility] to [facility] [place] line

## 2011-10-20 NOTE — Telephone Encounter (Signed)
Pt c/o cold symptoms with sinus infection last week - Took Doxycycline and finished  2 days ago - Now starting with prod cough (turning green last night), Left neck and ear soreness , sinus congestion. - Denies SOB, wheezing or fever.  Pt not sure if she needs to come or needs another abx called in.  Please advise. Allergies  Allergen Reactions  . Penicillins   . Sulfonamide Derivatives

## 2011-10-30 ENCOUNTER — Telehealth: Payer: Self-pay | Admitting: Internal Medicine

## 2011-10-30 MED ORDER — DOXYCYCLINE HYCLATE 100 MG PO TABS: ORAL_TABLET | ORAL | Status: DC

## 2011-10-30 NOTE — Telephone Encounter (Signed)
Called spoke with patient, finished abx 1 week ago and stated that she is "much better" > no pain, ears are better, sore throat.  But is still c/o head congestion, bilateral ear congestion but no pain, green nasal drainage.  Has been using netti pot and sudafed.  Would like to know if another abx is necessary - zpak or another type.  [facility] [place].   Allergies  Allergen Reactions  . Penicillins   . Sulfonamide Derivatives    Dr Maple Hudson please advise, thanks.

## 2011-10-30 NOTE — Telephone Encounter (Signed)
Pt aware rx for doxycycline 100 mg # 8 take 2 day and then 1 day sent to pharmacy [facility] [address]

## 2011-11-11 ENCOUNTER — Telehealth: Payer: Self-pay | Admitting: *Deleted

## 2011-11-11 DIAGNOSIS — E785 Hyperlipidemia, unspecified: Secondary | ICD-10-CM

## 2011-11-11 DIAGNOSIS — Z79899 Other long term (current) drug therapy: Secondary | ICD-10-CM

## 2011-11-11 NOTE — Telephone Encounter (Signed)
Pt called stating have 6 months f/u schedule for Friday. Suppose to have cholesterol check no orders in computer. Inform pt will place order pt coming in the am to have labs done... 11/11/11@1 :08pm/LMB

## 2011-11-12 ENCOUNTER — Other Ambulatory Visit (INDEPENDENT_AMBULATORY_CARE_PROVIDER_SITE_OTHER): Payer: 59

## 2011-11-12 DIAGNOSIS — Z79899 Other long term (current) drug therapy: Secondary | ICD-10-CM

## 2011-11-12 DIAGNOSIS — E785 Hyperlipidemia, unspecified: Secondary | ICD-10-CM

## 2011-11-12 LAB — LIPID PANEL
Cholesterol: 263 mg/dL — ABNORMAL HIGH (ref 0–200)
VLDL: 28.8 mg/dL (ref 0.0–40.0)

## 2011-11-12 LAB — HEPATIC FUNCTION PANEL
Albumin: 3.9 g/dL (ref 3.5–5.2)
Alkaline Phosphatase: 55 U/L (ref 39–117)
Total Protein: 6.2 g/dL (ref 6.0–8.3)

## 2011-11-14 ENCOUNTER — Ambulatory Visit (INDEPENDENT_AMBULATORY_CARE_PROVIDER_SITE_OTHER): Payer: 59 | Admitting: Internal Medicine

## 2011-11-14 ENCOUNTER — Encounter: Payer: Self-pay | Admitting: Internal Medicine

## 2011-11-14 VITALS — BP 122/72 | HR 93 | Temp 98.2°F | Resp 16 | Wt 199.0 lb

## 2011-11-14 DIAGNOSIS — K6289 Other specified diseases of anus and rectum: Secondary | ICD-10-CM

## 2011-11-14 DIAGNOSIS — E785 Hyperlipidemia, unspecified: Secondary | ICD-10-CM

## 2011-11-14 DIAGNOSIS — J328 Other chronic sinusitis: Secondary | ICD-10-CM

## 2011-11-14 MED ORDER — HYDROCORTISONE ACE-PRAMOXINE 2.5-1 % RE CREA: TOPICAL_CREAM | RECTAL | Status: DC

## 2011-11-14 MED ORDER — ATORVASTATIN CALCIUM 20 MG PO TABS: 20.0000 mg | ORAL_TABLET | Freq: Every day | ORAL | Status: DC

## 2011-11-14 NOTE — Assessment & Plan Note (Signed)
Continued rising trend - unable to loss weight due to recurrent illness in past 6 months Will start lipitor 20mg  qhs - potential risk/benefit reviewed; pt understands and agrees to same Recheck labs only in 3 mo, OV recheck in 6 mo

## 2011-11-14 NOTE — Progress Notes (Signed)
  Subjective:    Patient ID: Bianca Parker, female    DOB: Oct 08, 1945, 66 y.o.   MRN: [id number]  HPI  Here for follow up - chronic medical issues reviewed:  CLL - in remission - dx 2002 and tx 12/2001 with clinical trial at [facility] - follows with local onc every 4-6 months - poor "immune system response" due to same  Chronic allg rhinitis/sinusitus - freq flares and recurrent infections- reports compliance with ongoing medical treatment and no changes in medication dose or frequency. denies adverse side effects related to current therapy. follows with ENT and allergist for same  hx squam cell cancer, skin - 4 areas excised positive for ca - sees derm 3-4x/year - no current skin changes   Dyslipidemia - dx 04/2011 but has declined rx meds - takes omega 3, works on low fat diet and exercise as tolerated  Past Medical History  Diagnosis Date  . Anemia   . CARCINOMA, SKIN, SQUAMOUS CELL   . Allergic rhinitis, cause unspecified   . Chronic lymphoid leukemia in remission 2002 dx, tx 2003  . Dyslipidemia     Review of Systems  Constitutional: Negative for fever.  no unexpected weight change Respiratory: Negative for shortness of breath.   positive for cough, sinus congestion and green nasal discharge  Cardia vascular: No chest pain or palpitations GI: Complains of hemorrhoid flare, history of same     Objective:   Physical Exam  BP 122/72  Pulse 93  Temp(Src) 98.2 F (36.8 C) (Oral)  Resp 16  Wt 199 lb (90.266 kg)  SpO2 94% Wt Readings from Last 3 Encounters:  11/14/11 199 lb (90.266 kg)  07/01/11 167 lb 9.6 oz (76.023 kg)  05/22/11 170 lb 6.4 oz (77.293 kg)    Constitutional: She is overweight;  well-developed and well-nourished. No distress.  HENT: Head: Normocephalic and atraumatic. Nose: Nose mild discolored nasal thin discharge  with swollen turbinates. Mouth/Throat: Oropharynx is clear and moist, moderate erythema. No oropharyngeal exudate. Ears: B TMs  normal Eyes: Conjunctivae and EOM are normal. Pupils are equal, round, and reactive to light. No scleral icterus.  Neck: Normal range of motion. Neck supple. No JVD present. No thyromegaly present.  Cardiovascular: Normal rate, regular rhythm and normal heart sounds.  No murmur heard. Pulmonary/Chest: Effort normal and breath sounds with scattered rhonchi. No respiratory distress. She has no wheezes.  Rectal - 2 small skin tears top flesh colored papules at 12 o'clock position above anus - no hemorrhoids, fissure - soft brown heme neg stool, no mass Psychiatric: She has a normal mood and affect. Her behavior is normal. Judgment and thought content normal.   Lab Results  Component Value Date   WBC 5.2 07/15/2011   HGB 14.3 07/15/2011   HCT 42.3 07/15/2011   PLT 132* 07/15/2011   CHOL 263* 11/12/2011   TRIG 144.0 11/12/2011   HDL 68.70 11/12/2011   LDLDIRECT 176.8 11/12/2011   ALT 26 11/12/2011   AST 23 11/12/2011   NA 145 05/20/2011   K 4.7 05/20/2011   CL 109 05/20/2011   CREATININE 0.9 05/20/2011   BUN 15 05/20/2011   CO2 28 05/20/2011   TSH 4.37 05/20/2011        Assessment & Plan:   See problem list. Medications and labs reviewed today.  Anal skin tear - use steroid/lidocian cream for inflammation  - reassured no evidence of hemorrhoid, infection or HSV -

## 2011-11-14 NOTE — Patient Instructions (Signed)
It was good to see you today. If you develop worsening symptoms or fever, call and we can reconsider antibiotics, but it does not appear necessary to use antibiotics at this time. Start lipitor 20mg  daily - also Analpram cream as needed - Your prescription(s) have been submitted to your pharmacy. Please take as directed and contact our office if you believe you are having problem(s) with the medication(s). Return in 3 months for lab only check to monitor cholesterol and liver tests on Lipitor (frequent monitoring for first 12 months of treatment) return for office visit in 6 months - call sooner if problems

## 2011-11-14 NOTE — Assessment & Plan Note (Signed)
Recurrent infections - uses doxy as needed per pulm Also manage allergy symptoms as ongoing Interval history reviewed - The current medical regimen is effective;  continue present plan and medications.

## 2011-11-18 ENCOUNTER — Ambulatory Visit (HOSPITAL_BASED_OUTPATIENT_CLINIC_OR_DEPARTMENT_OTHER): Payer: 59 | Admitting: Oncology

## 2011-11-18 ENCOUNTER — Other Ambulatory Visit (HOSPITAL_BASED_OUTPATIENT_CLINIC_OR_DEPARTMENT_OTHER): Payer: 59 | Admitting: Lab

## 2011-11-18 ENCOUNTER — Telehealth: Payer: Self-pay | Admitting: *Deleted

## 2011-11-18 ENCOUNTER — Encounter: Payer: Self-pay | Admitting: Oncology

## 2011-11-18 VITALS — BP 160/95 | HR 112 | Temp 97.0°F | Ht 64.5 in | Wt 201.5 lb

## 2011-11-18 DIAGNOSIS — C9111 Chronic lymphocytic leukemia of B-cell type in remission: Secondary | ICD-10-CM

## 2011-11-18 DIAGNOSIS — C9110 Chronic lymphocytic leukemia of B-cell type not having achieved remission: Secondary | ICD-10-CM

## 2011-11-18 DIAGNOSIS — J329 Chronic sinusitis, unspecified: Secondary | ICD-10-CM

## 2011-11-18 DIAGNOSIS — J019 Acute sinusitis, unspecified: Secondary | ICD-10-CM

## 2011-11-18 DIAGNOSIS — J328 Other chronic sinusitis: Secondary | ICD-10-CM

## 2011-11-18 DIAGNOSIS — Z85828 Personal history of other malignant neoplasm of skin: Secondary | ICD-10-CM

## 2011-11-18 DIAGNOSIS — D801 Nonfamilial hypogammaglobulinemia: Secondary | ICD-10-CM

## 2011-11-18 DIAGNOSIS — D839 Common variable immunodeficiency, unspecified: Secondary | ICD-10-CM

## 2011-11-18 HISTORY — DX: Nonfamilial hypogammaglobulinemia: D80.1

## 2011-11-18 LAB — MORPHOLOGY

## 2011-11-18 LAB — CBC WITH DIFFERENTIAL/PLATELET
BASO%: 0.4 % (ref 0.0–2.0)
EOS%: 2.1 % (ref 0.0–7.0)
Eosinophils Absolute: 0.1 10*3/uL (ref 0.0–0.5)
MCH: 30.6 pg (ref 25.1–34.0)
MCV: 91.5 fL (ref 79.5–101.0)
MONO%: 6.4 % (ref 0.0–14.0)
NEUT#: 3.6 10*3/uL (ref 1.5–6.5)
RBC: 4.57 10*6/uL (ref 3.70–5.45)
RDW: 13.4 % (ref 11.2–14.5)

## 2011-11-18 LAB — COMPREHENSIVE METABOLIC PANEL
ALT: 22 U/L (ref 0–35)
BUN: 18 mg/dL (ref 6–23)
CO2: 31 mEq/L (ref 19–32)
Calcium: 9.6 mg/dL (ref 8.4–10.5)
Creatinine, Ser: 1.05 mg/dL (ref 0.50–1.10)
Total Bilirubin: 0.4 mg/dL (ref 0.3–1.2)

## 2011-11-18 LAB — LACTATE DEHYDROGENASE: LDH: 192 U/L (ref 94–250)

## 2011-11-18 NOTE — Telephone Encounter (Signed)
gave patient appointment for 11-25-2011 per michelle added on 02-17-2012 printed out calendar and gave to patient

## 2011-11-18 NOTE — Progress Notes (Signed)
Hematology and Oncology Follow Up Visit  Bianca Parker [id number] 1946-04-12 66 y.o. 11/18/2011 5:27 PM   Principle Diagnosis: Encounter Diagnoses  Name Primary?  . CLL (chronic lymphocytic leukemia)   . Sinusitis acute   . Hypogammaglobulinemia, acquired Yes  . RHINOSINUSITIS, CHRONIC      Interim History:   Followup visit for this 66 year old woman diagnosed with Rai stage III chronic lymphocytic leukemia back in 02/2001.  She had anemia and leukocytosis with lymphocytosis at diagnosis, but no adenopathy or organomegaly.  She was treated on [facility] protocol, and was one of the first cohorts to receive FCR, started in 12/2001, and continued through 06/2002.  She achieved a complete hematologic response and I believe also a molecular response by PCR, which has been durable now for almost 10 years!  She did develop hypogammaglobulinemia.  As a result, she gets recurrent bronchitis and sinusitis.  She has had a particularly bad year with a sinopulmonary infections just about every month for the last 11 months. I last checked immunoglobulin levels back in July 2012 and they remained low with serum IgG 160 mg percent, IgA less than 5 mg percent, and IgM 4 mg percent on 03/25/2011. She's had problems with recurrent squamous cell carcinomas of her skin and has had 2 lesions excised from her left tibial area and most recently 1 excised from the vulvar area. She has a history of both herpes zoster and herpes simplex infections.   Medications: reviewed  Allergies:  Allergies  Allergen Reactions  . Penicillins   . Pentamidine   . Sulfonamide Derivatives     Review of Systems: Constitutional:   No constitutional symptoms Respiratory: She has a current bronchitis sinusitis Cardiovascular:  No dyspnea or chest pain. No palpitations Gastrointestinal: No abdominal pain. No change in bowel habit. Genito-Urinary: No urinary tract symptoms. Musculoskeletal: No bone pain. Neurologic: No  headache or change in vision. Skin: See above Remaining ROS negative.  Physical Exam: Blood pressure 160/95, pulse 112, temperature 97 F (36.1 C), temperature source Oral, height 5' 4.5" (1.638 m), weight 201 lb 8 oz (91.4 kg). Wt Readings from Last 3 Encounters:  11/18/11 201 lb 8 oz (91.4 kg)  11/14/11 199 lb (90.266 kg)  07/01/11 167 lb 9.6 oz (76.023 kg)     General appearance: Well-nourished Caucasian woman HENNT: Pharynx no erythema or exudate Lymph nodes: No cervical supraclavicular or axillary adenopathy Breasts: Not examined Lungs: Clear to auscultation resonant to percussion Heart: Regular rhythm no murmur Abdomen: Soft nontender no mass no organomegaly Extremities: No edema no calf tenderness Vascular: No cyanosis Neurologic: No focal deficit Skin: Currently no skin lesions, perineal area not examined  Lab Results: Lab Results  Component Value Date   WBC 5.9 11/18/2011   HGB 14.0 11/18/2011   HCT 41.8 11/18/2011   MCV 91.5 11/18/2011   PLT 176 11/18/2011   white count differential: 62% neutrophils, 30% lymphocytes, 6% monocytes, 2% eosinophils   Chemistry      Component Value Date/Time   NA 146* 11/18/2011 1330   K 3.6 11/18/2011 1330   CL 106 11/18/2011 1330   CO2 31 11/18/2011 1330   BUN 18 11/18/2011 1330   CREATININE 1.05 11/18/2011 1330      Component Value Date/Time   CALCIUM 9.6 11/18/2011 1330   ALKPHOS 69 11/18/2011 1330   AST 23 11/18/2011 1330   ALT 22 11/18/2011 1330   BILITOT 0.4 11/18/2011 1330       Impression and Plan:   1.  Chronic lymphocytic leukemia - she remains in hematologic remission now out almost 10 years from initiation of fludarabine, Cytoxan, Rituxan which is absolutely amazing. 2. Thrombocytopenia - resolved. Likely medication related or viral infection related 3. .Recurrent sinopulmonary infectionsCombined effects of hypogammaglobulinemia and the B. and T-cell deficit associated with CLL.At this point I think she should be put on a  trial of monthly intravenous immunoglobulin 400 mg per kilogram to decrease the frequency and severity of infections. 4. Recurrent squamous cell carcinomas of the skin likely also a reflection of chronic immunosuppression. - she continues close followup with Dr. Emily Filbert. 5. History of herpes zoster infection. 6. History of herpes simplex virus 2   CC:. Dr. Rene Paci; Elmon Else; Jetty Duhamel; and Leonard Schwartz M.D. [facility]   Levert Feinstein,  3/26/20135:27 PM

## 2011-11-19 ENCOUNTER — Other Ambulatory Visit: Payer: Self-pay | Admitting: *Deleted

## 2011-11-19 ENCOUNTER — Telehealth: Payer: Self-pay | Admitting: Oncology

## 2011-11-19 DIAGNOSIS — J329 Chronic sinusitis, unspecified: Secondary | ICD-10-CM

## 2011-11-19 MED ORDER — CLARITHROMYCIN 500 MG PO TABS: 500.0000 mg | ORAL_TABLET | Freq: Two times a day (BID) | ORAL | Status: AC

## 2011-11-19 NOTE — Telephone Encounter (Signed)
Added lb for 4/2 per myrtle. Per myrtle she s/w pt and pt is aware of add on and new time.

## 2011-11-20 ENCOUNTER — Telehealth: Payer: Self-pay | Admitting: Internal Medicine

## 2011-11-20 NOTE — Telephone Encounter (Signed)
Noted IVIG

## 2011-11-25 ENCOUNTER — Ambulatory Visit (HOSPITAL_BASED_OUTPATIENT_CLINIC_OR_DEPARTMENT_OTHER): Payer: 59

## 2011-11-25 ENCOUNTER — Other Ambulatory Visit (HOSPITAL_BASED_OUTPATIENT_CLINIC_OR_DEPARTMENT_OTHER): Payer: 59 | Admitting: Lab

## 2011-11-25 VITALS — BP 144/91 | HR 85 | Temp 97.7°F | Wt 201.0 lb

## 2011-11-25 DIAGNOSIS — C9110 Chronic lymphocytic leukemia of B-cell type not having achieved remission: Secondary | ICD-10-CM

## 2011-11-25 DIAGNOSIS — J019 Acute sinusitis, unspecified: Secondary | ICD-10-CM

## 2011-11-25 DIAGNOSIS — D801 Nonfamilial hypogammaglobulinemia: Secondary | ICD-10-CM

## 2011-11-25 DIAGNOSIS — C9111 Chronic lymphocytic leukemia of B-cell type in remission: Secondary | ICD-10-CM

## 2011-11-25 DIAGNOSIS — J328 Other chronic sinusitis: Secondary | ICD-10-CM

## 2011-11-25 DIAGNOSIS — D839 Common variable immunodeficiency, unspecified: Secondary | ICD-10-CM

## 2011-11-25 MED ORDER — ACETAMINOPHEN 325 MG PO TABS
650.0000 mg | ORAL_TABLET | Freq: Four times a day (QID) | ORAL | Status: DC | PRN
  Administered 2011-11-25: 650 mg via ORAL

## 2011-11-25 MED ORDER — HEPARIN SOD (PORK) LOCK FLUSH 100 UNIT/ML IV SOLN
250.0000 [IU] | Freq: Once | INTRAVENOUS | Status: DC | PRN
  Filled 2011-11-25: qty 5

## 2011-11-25 MED ORDER — IMMUNE GLOBULIN (HUMAN) 20 GM/200ML IV SOLN
400.0000 mg/kg | Freq: Once | INTRAVENOUS | Status: AC
  Administered 2011-11-25: 35 g via INTRAVENOUS
  Filled 2011-11-25: qty 350

## 2011-11-25 MED ORDER — DIPHENHYDRAMINE HCL 25 MG PO TABS
25.0000 mg | ORAL_TABLET | Freq: Once | ORAL | Status: AC
  Administered 2011-11-25: 25 mg via ORAL
  Filled 2011-11-25: qty 1

## 2011-11-25 MED ORDER — SODIUM CHLORIDE 0.9 % IV SOLN
Freq: Once | INTRAVENOUS | Status: AC
  Administered 2011-11-25: 12:00:00 via INTRAVENOUS

## 2011-11-25 NOTE — Telephone Encounter (Signed)
Noted by CDY - will sign off.

## 2011-11-26 ENCOUNTER — Telehealth: Payer: Self-pay

## 2011-11-26 LAB — IGG, IGA, IGM
IgA: <7 mg/dL — ABNORMAL LOW (ref 69–380)
IgG (Immunoglobin G), Serum: 153 mg/dL — ABNORMAL LOW (ref 690–1700)
IgM, Serum: 6 mg/dL — ABNORMAL LOW (ref 52–322)

## 2011-11-26 NOTE — Telephone Encounter (Signed)
Received call from pt stating that she received her first IVIG yesterday without complication, although she had fevers during the night.  Pt reports temp as elevated as 101 & is currently 100.5 after Tylenol.  Pt questions if this is "normal" and how long she should expect temp to be elevated.  Pt has plans to travel out of town this weekend & is concerned she won't be "well enough."    Note to Dr Cyndie Chime. dph

## 2011-11-26 NOTE — Telephone Encounter (Signed)
Pt notified per Dr Cyndie Chime -  Temps are not common, but do happen. Expect temp to resolve in 24-48 hours.  Dr Cyndie Chime to add steroid to pre-meds next time to prevent temp.   Pt verbalizes understanding & appreciation. dph

## 2011-12-23 ENCOUNTER — Ambulatory Visit (HOSPITAL_BASED_OUTPATIENT_CLINIC_OR_DEPARTMENT_OTHER): Payer: 59

## 2011-12-23 ENCOUNTER — Other Ambulatory Visit: Payer: Self-pay | Admitting: Nurse Practitioner

## 2011-12-23 VITALS — BP 153/86 | HR 80 | Temp 98.4°F

## 2011-12-23 DIAGNOSIS — C9111 Chronic lymphocytic leukemia of B-cell type in remission: Secondary | ICD-10-CM

## 2011-12-23 DIAGNOSIS — D801 Nonfamilial hypogammaglobulinemia: Secondary | ICD-10-CM

## 2011-12-23 MED ORDER — IMMUNE GLOBULIN (HUMAN) 20 GM/200ML IV SOLN
35.0000 g | Freq: Once | INTRAVENOUS | Status: AC
  Administered 2011-12-23: 35 g via INTRAVENOUS
  Filled 2011-12-23: qty 350

## 2011-12-23 MED ORDER — DIPHENHYDRAMINE HCL 25 MG PO CAPS
25.0000 mg | ORAL_CAPSULE | Freq: Once | ORAL | Status: AC
  Administered 2011-12-23: 25 mg via ORAL

## 2011-12-23 MED ORDER — SODIUM CHLORIDE 0.9 % IV SOLN
INTRAVENOUS | Status: DC
  Administered 2011-12-23: 11:00:00 via INTRAVENOUS

## 2011-12-23 MED ORDER — ACETAMINOPHEN 325 MG PO TABS
650.0000 mg | ORAL_TABLET | Freq: Once | ORAL | Status: AC
  Administered 2011-12-23: 650 mg via ORAL

## 2011-12-23 MED ORDER — METHYLPREDNISOLONE SODIUM SUCC 40 MG IJ SOLR
40.0000 mg | Freq: Once | INTRAMUSCULAR | Status: AC
  Administered 2011-12-23: 40 mg via INTRAVENOUS

## 2011-12-23 NOTE — Patient Instructions (Addendum)
[facility] Discharge Instructions for Patients Receiving IVIG Today you received the followingagents IVIG  BELOW ARE SYMPTOMS THAT SHOULD BE REPORTED IMMEDIATELY:  *FEVER GREATER THAN 100.5 F  *CHILLS WITH OR WITHOUT FEVER . Feel free to call the clinic you have any questions or concerns. The clinic phone number is [phone].   I have been informed and understand all the instructions given to me. I know to contact the clinic, my physician, or go to the Emergency Department if any problems should occur. I do not have any questions at this time, but understand that I may call the clinic during office hours   should I have any questions or need assistance in obtaining follow up care.    __________________________________________  _____________  __________ Signature of Patient or Authorized Representative            Date                   Time    __________________________________________ Nurse's Signature

## 2011-12-24 ENCOUNTER — Telehealth: Payer: Self-pay | Admitting: *Deleted

## 2011-12-24 NOTE — Telephone Encounter (Signed)
Pt left vm that she had IVIG yest & noticed this am that she had a red streak up the vein that she had her IVIG in going up to elbow.   Discussed with Dr. Cyndie Chime & encouraged pt to apply warm compresses to site 4-5 x/d & OK to take anti-inflammatory like advil q 6-8h.  Encouraged to call back if site worse or warm to touch or fever & asked her to call back fri just to let us know how it looks.  Suggested that it might just be irritation to the vein from the IVIG or a mechanical phebitis.

## 2011-12-25 ENCOUNTER — Telehealth: Payer: Self-pay | Admitting: *Deleted

## 2011-12-25 NOTE — Telephone Encounter (Signed)
Received vm call from pt stating that she followed our instructions & her arm is back to normal today.

## 2012-01-01 ENCOUNTER — Telehealth: Payer: Self-pay

## 2012-01-01 NOTE — Telephone Encounter (Signed)
Pt called c/o of intermittent diarrhea since Sunday with one day of low grade fever. Pt has been using Imodium with only mild relief. Pt is requesting MD's advisement.

## 2012-01-01 NOTE — Telephone Encounter (Signed)
Pt can take up to 8 tabs imodium/24h - she should contact onc if any fever, or schedule OV if symptoms unimproved or if any nausea and vomiting or abdominal pain

## 2012-01-01 NOTE — Telephone Encounter (Signed)
Pt called back stating that diarrhea started 5 days after IV IG treatment. Should she contact Oncology?

## 2012-01-01 NOTE — Telephone Encounter (Signed)
Pt advised and will call back PRN 

## 2012-01-02 ENCOUNTER — Encounter: Payer: Self-pay | Admitting: *Deleted

## 2012-01-02 NOTE — Progress Notes (Unsigned)
Pt reports that since receiving IVIG last week she has had sporadic episodes of loose bowel movements, nausea, general fatigue. Pt currently denies fever, and reports that she is on the [employer]. Pt has reported sx to PCP who recommended Immodium up to 8 times daily, this desk nurse agrees with that for tx of diarrhea. Pt will continue to "take it easy over the weekend" pt will call Dr Patsy Lager desk nurse on  Monday

## 2012-01-05 ENCOUNTER — Telehealth: Payer: Self-pay

## 2012-01-05 NOTE — Telephone Encounter (Signed)
Spoke with pt per Dr Cyndie Chime -   "Diarrhea is not a side effect of IVIG. Continue to use imodium for flare up. Increase PO fluids. Doubt any liver problem. We are not set up to do stool cultures.  May need to check with PCP to schedule cultures or we can refer to GI specialist."  Pt confirms that she has not been on antibiotics.  Pt verbalizes that she is pushing fluids and states that her urine is "clear" and does not appear to be concentrated.  Encouraged her to add Gatoraid or Pedia-lyte.  Pt is comfortable with this plan.  Will contact PCP "in a couple days" if sx persist.  Pt states "I really just want to be sure I'm not damaging my liver or kidneys," & reports "feeling better about things" after sx of liver &/or kidney damage reviewed.  Pt verbalizes appreciation.  dph

## 2012-01-05 NOTE — Telephone Encounter (Signed)
Received call from pt reporting she is "still having diarrhea" since receiving IVIG on 4/30. (patient spoke with triage nurse on Friday.)  Pt reports she began having "watery diarrhea" on Sunday 5/5.  Pt has had some relief with Imodium, but has awakened in "a pool of poop" 2 nights.  Pt reports having at temp on Monday night and Friday night - the highest being 100.4.  Pt denies nausea, vomiting, or abd pain, but has had "some joint and back aches" intermittently.  Pt states she has gone days with no diarrhea, but then "it will hit me again."  Has been eating a bland BRAT diet.  Reports "I'm getting very weak with this."  Pt states "I don't know if it is the IVIG or I picked up a bug or if my liver is going haywire."    Note to Dr Cyndie Chime. dph

## 2012-01-06 ENCOUNTER — Encounter: Payer: Self-pay | Admitting: Internal Medicine

## 2012-01-06 ENCOUNTER — Ambulatory Visit (INDEPENDENT_AMBULATORY_CARE_PROVIDER_SITE_OTHER): Payer: 59 | Admitting: Internal Medicine

## 2012-01-06 ENCOUNTER — Other Ambulatory Visit (INDEPENDENT_AMBULATORY_CARE_PROVIDER_SITE_OTHER): Payer: 59

## 2012-01-06 VITALS — BP 112/74 | HR 81 | Temp 97.1°F | Ht 64.5 in | Wt 200.4 lb

## 2012-01-06 DIAGNOSIS — R197 Diarrhea, unspecified: Secondary | ICD-10-CM

## 2012-01-06 DIAGNOSIS — D839 Common variable immunodeficiency, unspecified: Secondary | ICD-10-CM

## 2012-01-06 DIAGNOSIS — D801 Nonfamilial hypogammaglobulinemia: Secondary | ICD-10-CM

## 2012-01-06 DIAGNOSIS — R7989 Other specified abnormal findings of blood chemistry: Secondary | ICD-10-CM

## 2012-01-06 LAB — CBC WITH DIFFERENTIAL/PLATELET
Basophils Absolute: 0 10*3/uL (ref 0.0–0.1)
Eosinophils Absolute: 0.1 10*3/uL (ref 0.0–0.7)
HCT: 45 % (ref 36.0–46.0)
Lymphs Abs: 1.9 10*3/uL (ref 0.7–4.0)
MCHC: 33.6 g/dL (ref 30.0–36.0)
Monocytes Absolute: 0.4 10*3/uL (ref 0.1–1.0)
Monocytes Relative: 6.8 % (ref 3.0–12.0)
Neutro Abs: 4.1 10*3/uL (ref 1.4–7.7)
Platelets: 152 10*3/uL (ref 150.0–400.0)
RDW: 13.7 % (ref 11.5–14.6)

## 2012-01-06 LAB — BASIC METABOLIC PANEL
CO2: 26 mEq/L (ref 19–32)
Calcium: 9.7 mg/dL (ref 8.4–10.5)
Creatinine, Ser: 1 mg/dL (ref 0.4–1.2)
GFR: 61.19 mL/min (ref >60.00)
Sodium: 142 mEq/L (ref 135–145)

## 2012-01-06 LAB — HEPATIC FUNCTION PANEL
Alkaline Phosphatase: 66 U/L (ref 39–117)
Bilirubin, Direct: 0.1 mg/dL (ref 0.0–0.3)
Total Bilirubin: 0.7 mg/dL (ref 0.3–1.2)

## 2012-01-06 MED ORDER — DIPHENOXYLATE-ATROPINE 2.5-0.025 MG PO TABS: 1.0000 | ORAL_TABLET | Freq: Four times a day (QID) | ORAL | Status: AC | PRN

## 2012-01-06 NOTE — Assessment & Plan Note (Signed)
IVIG transfusion 12/23/11 - see discussion above follow up heme onc as ongoing

## 2012-01-06 NOTE — Patient Instructions (Signed)
It was good to see you today. If you develop worsening symptoms or fever, call and we can reconsider antibiotics, but it does not appear necessary to use antibiotics at this time. Test(s) ordered today. Your results will be called to you after review (48-72hours after test completion). If any changes need to be made, you will be notified at that time. we'll make referral to [facility] . Our office will contact you regarding appointment(s) once made. Change OTC imodium to Lomotil as needed -Your prescription(s) have been submitted to your pharmacy. Please take as directed and contact our office if you believe you are having problem(s) with the medication(s).

## 2012-01-06 NOTE — Progress Notes (Signed)
  Subjective:    Patient ID: Bianca Parker, female    DOB: [birth year], 66 y.o.   MRN: [id number]  Diarrhea  This is a new problem. The current episode started 1 to 4 weeks ago. The problem occurs 5 to 10 times per day. The problem has been unchanged. The stool consistency is described as watery. The patient states that diarrhea awakens her from sleep. Associated symptoms include bloating, a fever, increased flatus and myalgias. Pertinent negatives include no abdominal pain or vomiting. The symptoms are aggravated by nothing. Risk factors: IVIG 12/23/11, contact with g-son, fresh raw veggie/fruits. She has tried anti-motility drug, increased fluids and electrolyte solution for the symptoms. The treatment provided no relief.    Past Medical History  Diagnosis Date  . Anemia   . CARCINOMA, SKIN, SQUAMOUS CELL   . Allergic rhinitis, cause unspecified   . Chronic lymphoid leukemia in remission 2002 dx, tx 2003  . Dyslipidemia   . Hypogammaglobulinemia, acquired 11/18/2011    Due to CLL and prior chemo    Review of Systems  Constitutional: Positive for fever. Negative for unexpected weight change.  Gastrointestinal: Positive for diarrhea, bloating and flatus. Negative for nausea, vomiting, abdominal pain and blood in stool.  Musculoskeletal: Positive for myalgias and back pain.       Objective:   Physical Exam BP 112/74  Pulse 81  Temp(Src) 97.1 F (36.2 C) (Oral)  Ht 5' 4.5" (1.638 m)  Wt 200 lb 6.4 oz (90.901 kg)  BMI 33.87 kg/m2  SpO2 96% Wt Readings from Last 3 Encounters:  01/06/12 200 lb 6.4 oz (90.901 kg)  11/25/11 201 lb (91.173 kg)  11/18/11 201 lb 8 oz (91.4 kg)   Constitutional: She is overweight, but appears well-developed and well-nourished. No distress. nontoxic HENT: Head: Normocephalic and atraumatic. Ears: B TMs ok, no erythema or effusion; Nose: Nose normal. Mouth/Throat: Oropharynx is clear and moist. No oropharyngeal exudate.  Eyes: Conjunctivae and EOM are  normal. Pupils are equal, round, and reactive to light. No scleral icterus.  Neck: Normal range of motion. Neck supple. No JVD present. No thyromegaly present.  Cardiovascular: Normal rate, regular rhythm and normal heart sounds.  No murmur heard. No BLE edema. Pulmonary/Chest: Effort normal and breath sounds normal. No respiratory distress. She has no wheezes.  Abdominal: Soft. Bowel sounds are normal. She exhibits no distension. There is no tenderness. no masses Skin: Skin is warm and dry. No rash noted. No erythema.  Psychiatric: She has a normal mood and affect. Her behavior is normal. Judgment and thought content normal.   Lab Results  Component Value Date   WBC 5.9 11/18/2011   HGB 14.0 11/18/2011   HCT 41.8 11/18/2011   PLT 176 11/18/2011   GLUCOSE 112* 11/18/2011   CHOL 263* 11/12/2011   TRIG 144.0 11/12/2011   HDL 68.70 11/12/2011   LDLDIRECT 176.8 11/12/2011   ALT 22 11/18/2011   AST 23 11/18/2011   NA 146* 11/18/2011   K 3.6 11/18/2011   CL 106 11/18/2011   CREATININE 1.05 11/18/2011   BUN 18 11/18/2011   CO2 31 11/18/2011   TSH 4.37 05/20/2011      Assessment & Plan:  Diarrhea - ?IVIG related fro 4/30 infusion - onc feels unlikely but pt with severe myalgias and nonstandard reaction to prior INIG infusions  Check labs and stool sample now for infection/C diff Change imodium to lomotil and refer to GI (previously at [facility], wishes to be at [facility])

## 2012-01-07 ENCOUNTER — Ambulatory Visit: Payer: Medicare Other | Admitting: Internal Medicine

## 2012-01-07 NOTE — Progress Notes (Signed)
Addended by: Rene Paci A on: 01/07/2012 06:24 PM   Modules accepted: Orders

## 2012-01-09 ENCOUNTER — Other Ambulatory Visit: Payer: Self-pay | Admitting: Internal Medicine

## 2012-01-09 ENCOUNTER — Ambulatory Visit
Admission: RE | Admit: 2012-01-09 | Discharge: 2012-01-09 | Disposition: A | Discharge status: Home / Self Care | Payer: 59 | Source: Ambulatory Visit | Attending: Internal Medicine | Admitting: Internal Medicine

## 2012-01-09 DIAGNOSIS — D801 Nonfamilial hypogammaglobulinemia: Secondary | ICD-10-CM

## 2012-01-09 DIAGNOSIS — R197 Diarrhea, unspecified: Secondary | ICD-10-CM

## 2012-01-09 DIAGNOSIS — N281 Cyst of kidney, acquired: Secondary | ICD-10-CM

## 2012-01-13 ENCOUNTER — Telehealth: Payer: Self-pay | Admitting: *Deleted

## 2012-01-13 NOTE — Telephone Encounter (Signed)
Pt. notified per Dr. Cyndie Chime that findings on labs will not affect her receiving IVIG.

## 2012-01-20 ENCOUNTER — Ambulatory Visit (HOSPITAL_BASED_OUTPATIENT_CLINIC_OR_DEPARTMENT_OTHER): Payer: 59

## 2012-01-20 VITALS — BP 116/73 | HR 70 | Temp 98.8°F

## 2012-01-20 DIAGNOSIS — C9110 Chronic lymphocytic leukemia of B-cell type not having achieved remission: Secondary | ICD-10-CM

## 2012-01-20 DIAGNOSIS — D801 Nonfamilial hypogammaglobulinemia: Secondary | ICD-10-CM

## 2012-01-20 DIAGNOSIS — D839 Common variable immunodeficiency, unspecified: Secondary | ICD-10-CM

## 2012-01-20 MED ORDER — DIPHENHYDRAMINE HCL 25 MG PO CAPS
25.0000 mg | ORAL_CAPSULE | Freq: Once | ORAL | Status: AC
  Administered 2012-01-20: 25 mg via ORAL

## 2012-01-20 MED ORDER — ACETAMINOPHEN 325 MG PO TABS
650.0000 mg | ORAL_TABLET | Freq: Once | ORAL | Status: AC
  Administered 2012-01-20: 650 mg via ORAL

## 2012-01-20 MED ORDER — IMMUNE GLOBULIN (HUMAN) 20 GM/200ML IV SOLN
35.0000 g | Freq: Once | INTRAVENOUS | Status: AC
  Administered 2012-01-20: 35 g via INTRAVENOUS
  Filled 2012-01-20: qty 350

## 2012-01-20 MED ORDER — METHYLPREDNISOLONE SODIUM SUCC 40 MG IJ SOLR
40.0000 mg | Freq: Once | INTRAMUSCULAR | Status: AC
  Administered 2012-01-20: 40 mg via INTRAVENOUS

## 2012-01-20 NOTE — Patient Instructions (Signed)
[facility] Discharge Instructions for Patients Receiving Chemotherapy  Today you received the following chemotherapy agents Privigen.  To help prevent nausea and vomiting after your treatment, we encourage you to take your nausea medication.   If you develop nausea and vomiting that is not controlled by your nausea medication, call the clinic. If it is after clinic hours your family physician or the after hours number for the clinic or go to the Emergency Department.   BELOW ARE SYMPTOMS THAT SHOULD BE REPORTED IMMEDIATELY:  *FEVER GREATER THAN 100.5 F  *CHILLS WITH OR WITHOUT FEVER  NAUSEA AND VOMITING THAT IS NOT CONTROLLED WITH YOUR NAUSEA MEDICATION  *UNUSUAL SHORTNESS OF BREATH  *UNUSUAL BRUISING OR BLEEDING  TENDERNESS IN MOUTH AND THROAT WITH OR WITHOUT PRESENCE OF ULCERS  *URINARY PROBLEMS  *BOWEL PROBLEMS  UNUSUAL RASH Items with * indicate a potential emergency and should be followed up as soon as possible.  One of the nurses will contact you 24 hours after your treatment. Please let the nurse know about any problems that you may have experienced. Feel free to call the clinic you have any questions or concerns. The clinic phone number is [phone].   I have been informed and understand all the instructions given to me. I know to contact the clinic, my physician, or go to the Emergency Department if any problems should occur. I do not have any questions at this time, but understand that I may call the clinic during office hours   should I have any questions or need assistance in obtaining follow up care.    __________________________________________  _____________  __________ Signature of Patient or Authorized Representative            Date                   Time    __________________________________________ Nurse's Signature

## 2012-01-21 ENCOUNTER — Other Ambulatory Visit: Payer: 59

## 2012-01-21 ENCOUNTER — Encounter: Payer: Self-pay | Admitting: Gastroenterology

## 2012-01-27 ENCOUNTER — Ambulatory Visit
Admission: RE | Admit: 2012-01-27 | Discharge: 2012-01-27 | Disposition: A | Discharge status: Home / Self Care | Payer: 59 | Source: Ambulatory Visit | Attending: Internal Medicine | Admitting: Internal Medicine

## 2012-01-27 DIAGNOSIS — N281 Cyst of kidney, acquired: Secondary | ICD-10-CM

## 2012-01-27 MED ORDER — GADOBENATE DIMEGLUMINE 529 MG/ML IV SOLN
19.0000 mL | Freq: Once | INTRAVENOUS | Status: AC | PRN
  Administered 2012-01-27: 19 mL via INTRAVENOUS

## 2012-01-28 ENCOUNTER — Other Ambulatory Visit: Payer: 59

## 2012-01-28 ENCOUNTER — Telehealth: Payer: Self-pay | Admitting: Internal Medicine

## 2012-01-28 NOTE — Telephone Encounter (Signed)
MD out of office. Pls advise... 01/28/12@2 :31pm/LMB

## 2012-01-28 NOTE — Telephone Encounter (Signed)
Dr. Jonny Ruiz address MRI see report. Contacted pt with results... 01/28/12@2 :50pm/LMB

## 2012-01-28 NOTE — Telephone Encounter (Signed)
Already addressed see note 

## 2012-01-28 NOTE — Telephone Encounter (Signed)
Caller: Bianca Parker/Patient; PCP: Rene Paci; CB#: [phone]; Call regarding Requesting Results;  Bianca Parker had MRI on 01/27/12.  Requesting results.  Please f/u with Cari at [phone].  If unable to reach at that number, please call [phone].

## 2012-02-16 ENCOUNTER — Encounter: Payer: Self-pay | Admitting: Gastroenterology

## 2012-02-16 ENCOUNTER — Ambulatory Visit (INDEPENDENT_AMBULATORY_CARE_PROVIDER_SITE_OTHER): Payer: 59 | Admitting: Gastroenterology

## 2012-02-16 ENCOUNTER — Telehealth: Payer: Self-pay

## 2012-02-16 VITALS — BP 122/68 | HR 80 | Ht 64.0 in | Wt 207.0 lb

## 2012-02-16 DIAGNOSIS — R197 Diarrhea, unspecified: Secondary | ICD-10-CM | POA: Insufficient documentation

## 2012-02-16 MED ORDER — MOVIPREP 100 G PO SOLR: 1.0000 | ORAL | Status: DC

## 2012-02-16 NOTE — Progress Notes (Signed)
HPI: This is a   very pleasant 66 year old woman who is very knowledgeable about her medical history  She had a colonoscopy with Dr. Vilinda Boehringer November 2006. This was done for routine screening. He noted that the examination was normal except for internal hemorrhoids. He recommended recall colonoscopy at 10 years.  She has known acquired immunodeficiency, this is from chemotherapy for leukemia or lymphoma.   Has been in clinical study at [facility], first started treatment 10 years ago.    For many years she has had well documented low levels of IgA, IgG, IgM. Recently liver tests show AST 94 ALT 159. Ultrasound of her liver showed fatty liver but was otherwise normal may 2013,   Started probiotics, has been on [place], [facility]ill[facility] colon health.  Has been taking probiotics for many many months and doing well without any diarrhea.   She has repeated infections (ear, nose, sinus) and gets on antiobiotics. Pneumonia.  Started IvIG Dr. Cyndie Chime, 3 months ago.  Had headaches, others.  And second round they added steroids.  Diarrhea returned.  Started imodium.  Still having diarrhea.  Dr. Cyndie Chime doubted that IvIG was causing the diarrhea.  She had a sense that her "liver wasn't working right" and LFTs were indeed elevated.  Has been taking imodium 8 a day and has been very constipated.  Has had two more bouts of diarrhea in past couple months.    Review of systems: Pertinent positive and negative review of systems were noted in the above HPI section. Complete review of systems was performed and was otherwise normal.    Past Medical History  Diagnosis Date  . Anemia   . CARCINOMA, SKIN, SQUAMOUS CELL   . Allergic rhinitis, cause unspecified   . Chronic lymphoid leukemia in remission 2002 dx, tx 2003  . Dyslipidemia   . Hypogammaglobulinemia, acquired 11/18/2011    Due to CLL and prior chemo    Past Surgical History  Procedure Date  . Abdominal hysterectomy 1966  .  Tonsillectomy   . Bunionectomy     Current Outpatient Prescriptions  Medication Sig Dispense Refill  . atorvastatin (LIPITOR) 20 MG tablet Take 20 mg by mouth daily. ON HOLD      . diphenhydrAMINE (BENADRYL) 25 MG tablet Take 25 mg by mouth at bedtime as needed.        . diphenoxylate-atropine (LOMOTIL) 2.5-0.025 MG per tablet Uses as directed      . fexofenadine (ALLEGRA) 180 MG tablet Take 180 mg by mouth daily.        . hydrocortisone-pramoxine (ANALPRAM-HC) 2.5-1 % rectal cream Place at anus 2-3 times daily ss needed  30 g  0  . Loperamide HCl (IMODIUM A-D PO) Take by mouth as needed.      . Probiotic Product (PHILLIPS COLON HEALTH) CAPS Take by mouth daily.        Marland Kitchen DISCONTD: atorvastatin (LIPITOR) 20 MG tablet Take 1 tablet (20 mg total) by mouth daily.  90 tablet  3    Allergies as of 02/16/2012 - Review Complete 02/16/2012  Allergen Reaction Noted  . Penicillins    . Pentamidine  11/14/2011  . Sulfonamide derivatives      Family History  Problem Relation Age of Onset  . Colon cancer Mother   . Breast cancer Other   . Ovarian cancer Other     History   Social History  . Marital Status: Married    Spouse Name: N/A    Number of Children: N/A  .  Years of Education: N/A   Occupational History  . Not on file.   Social History Main Topics  . Smoking status: Never Smoker   . Smokeless tobacco: Never Used   Comment: Married, lives with husband [name]), grown children. 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 Alcohol Use: Yes     Rare  . Drug Use: No  . Sexually Active: Yes   Other Topics Concern  . Not on file   Social History Narrative  . No narrative on file       Physical Exam: BP 122/68  Pulse 80  Ht 5\' 4"  (1.626 m)  Wt 207 lb (93.895 kg)  BMI 35.53 kg/m2 Constitutional: generally well-appearing Psychiatric: alert and oriented x3 Eyes: extraocular movements intact Mouth: oral pharynx moist, no lesions Neck: supple no  lymphadenopathy Cardiovascular: heart regular rate and rhythm Lungs: clear to auscultation bilaterally Abdomen: soft, nontender, nondistended, no obvious ascites, no peritoneal signs, normal bowel sounds Extremities: no lower extremity edema bilaterally Skin: no lesions on visible extremities    Assessment and plan: 66 y.o. female with  Cvid, diarrhea, family history of colon cancer  She has low levels of IgA, IgG, IgM from chemotherapy about 10 years ago. This condition is commonly associated with diarrhea. Can be from GERD, CMV, crypto. Villous blunting can also occur, IBD-like changes throughout the bowel, small bowel bacterial overgrowth can occur. She also has elevated liver tests that I am not sure are associated with her diarrhea. She certainly needs colonoscopy for family history colon cancer in her ongoing diarrhea. This does occur episodically usually hours at a time can be spaced out by dates months. Between this she was normal. A plan to look in her terminal ileum. She is going to have stools checked for Giardia and cryptosporidia now. She will get a battery of biochemical markers, serology tests for elevated liver tests as well. I want to also plan to look in her stomach, upper intestine to biopsy her for celiac sprue type changes.

## 2012-02-16 NOTE — Patient Instructions (Addendum)
You will be set up for a colonoscopy for family history of colon cancer (mother) and diarrhea. You will be set up for an upper endoscopy to check for villous blunting. Start a single imodium at bedtime every night. You will have labs checked today in the basement lab.  Please head down after you check out with the front desk  (stool for ova and parasites, stool for cryptosporidium) Bloodwork (possibly tomorrow at [facility]):,  Hepatitis A (IgM and IgG), Hepatitis B surface antigen, Hepatitis B surface antibody, Hepatitis C antibody, total iron, ferritin, TIBC, ANA, AMA, alphafeto protein (AFP), anti smooth muscle antibody, alpha 1 antitrypsin, cerulloplasm, TTG, total IgA level).

## 2012-02-16 NOTE — Telephone Encounter (Signed)
Pt states she is coming to office tomorrow for lab, visit, and infusion, and wanted to know if labs ordered by Dr. Christella Hartigan could be drawn here tomorrow.  Spoke with Annice Pih in the lab, who states that our facility does not test for all those specific labs, so pt would have to have them drawn at Dr. Christella Hartigan' office.  Called pt back and informed her of this, and she verbalizes understanding.

## 2012-02-17 ENCOUNTER — Telehealth: Payer: Self-pay | Admitting: Oncology

## 2012-02-17 ENCOUNTER — Ambulatory Visit (HOSPITAL_BASED_OUTPATIENT_CLINIC_OR_DEPARTMENT_OTHER): Payer: 59

## 2012-02-17 ENCOUNTER — Ambulatory Visit (HOSPITAL_BASED_OUTPATIENT_CLINIC_OR_DEPARTMENT_OTHER): Payer: 59 | Admitting: Nurse Practitioner

## 2012-02-17 ENCOUNTER — Other Ambulatory Visit: Payer: 59

## 2012-02-17 ENCOUNTER — Other Ambulatory Visit: Payer: Self-pay | Admitting: Internal Medicine

## 2012-02-17 ENCOUNTER — Telehealth: Payer: Self-pay | Admitting: *Deleted

## 2012-02-17 ENCOUNTER — Other Ambulatory Visit (HOSPITAL_BASED_OUTPATIENT_CLINIC_OR_DEPARTMENT_OTHER): Payer: 59 | Admitting: Lab

## 2012-02-17 VITALS — BP 136/91 | HR 87 | Temp 97.5°F | Ht 64.0 in | Wt 208.8 lb

## 2012-02-17 VITALS — BP 150/82 | HR 74 | Temp 98.3°F

## 2012-02-17 DIAGNOSIS — J019 Acute sinusitis, unspecified: Secondary | ICD-10-CM

## 2012-02-17 DIAGNOSIS — D839 Common variable immunodeficiency, unspecified: Secondary | ICD-10-CM

## 2012-02-17 DIAGNOSIS — J328 Other chronic sinusitis: Secondary | ICD-10-CM

## 2012-02-17 DIAGNOSIS — C9110 Chronic lymphocytic leukemia of B-cell type not having achieved remission: Secondary | ICD-10-CM

## 2012-02-17 DIAGNOSIS — D801 Nonfamilial hypogammaglobulinemia: Secondary | ICD-10-CM

## 2012-02-17 DIAGNOSIS — C9111 Chronic lymphocytic leukemia of B-cell type in remission: Secondary | ICD-10-CM

## 2012-02-17 DIAGNOSIS — R197 Diarrhea, unspecified: Secondary | ICD-10-CM

## 2012-02-17 LAB — COMPREHENSIVE METABOLIC PANEL
ALT: 36 U/L — ABNORMAL HIGH (ref 0–35)
Albumin: 4.1 g/dL (ref 3.5–5.2)
BUN: 15 mg/dL (ref 6–23)
CO2: 21 mEq/L (ref 19–32)
Calcium: 9.7 mg/dL (ref 8.4–10.5)
Chloride: 104 mEq/L (ref 96–112)
Creatinine, Ser: 0.9 mg/dL (ref 0.50–1.10)
Potassium: 4.1 mEq/L (ref 3.5–5.3)

## 2012-02-17 LAB — CBC WITH DIFFERENTIAL/PLATELET
EOS%: 2.2 % (ref 0.0–7.0)
MCH: 30.1 pg (ref 25.1–34.0)
MCHC: 34.4 g/dL (ref 31.5–36.0)
MCV: 87.6 fL (ref 79.5–101.0)
MONO%: 8.9 % (ref 0.0–14.0)
RBC: 4.75 10*6/uL (ref 3.70–5.45)
RDW: 13.8 % (ref 11.2–14.5)

## 2012-02-17 MED ORDER — SODIUM CHLORIDE 0.9 % IJ SOLN
10.0000 mL | INTRAMUSCULAR | Status: DC | PRN
  Filled 2012-02-17: qty 10

## 2012-02-17 MED ORDER — DIPHENHYDRAMINE HCL 25 MG PO CAPS
25.0000 mg | ORAL_CAPSULE | Freq: Once | ORAL | Status: AC
  Administered 2012-02-17: 25 mg via ORAL

## 2012-02-17 MED ORDER — IMMUNE GLOBULIN (HUMAN) 20 GM/200ML IV SOLN
35.0000 g | Freq: Once | INTRAVENOUS | Status: AC
  Administered 2012-02-17: 35 g via INTRAVENOUS
  Filled 2012-02-17: qty 350

## 2012-02-17 MED ORDER — METHYLPREDNISOLONE SODIUM SUCC 40 MG IJ SOLR
40.0000 mg | Freq: Once | INTRAMUSCULAR | Status: AC
  Administered 2012-02-17: 40 mg via INTRAVENOUS

## 2012-02-17 MED ORDER — ACETAMINOPHEN 325 MG PO TABS
650.0000 mg | ORAL_TABLET | Freq: Once | ORAL | Status: AC
  Administered 2012-02-17: 650 mg via ORAL

## 2012-02-17 NOTE — Telephone Encounter (Signed)
Per staff message I have scheduled appts. JMW  

## 2012-02-17 NOTE — Progress Notes (Signed)
Patient tolerated treatment well.  Maximum rate 188 mL/hr.  Discharged to home with daughter.  No complaints.

## 2012-02-17 NOTE — Telephone Encounter (Signed)
Gv pt appt for july and aug2013 

## 2012-02-17 NOTE — Progress Notes (Signed)
OFFICE PROGRESS NOTE  Interval history:  Bianca Parker is a 66 year old woman diagnosed with RAI stage III CLL in July 2002. She was treated on protocol at [facility] with FCR. She achieved a complete response that has been durable. She developed hypogammaglobulinemia and as a result gets recurrent bronchitis and sinusitis. She was started on a trial of IVIG on 11/25/2011. She is seen today for scheduled followup.  Bianca Parker reports no infection since beginning the IVIG. She continues to have intermittent loose stools. She has seen Dr. Christella Hartigan and is scheduled for a colonoscopy next month. No fevers or sweats. She has a good appetite and overall good energy level. She continues close followup with dermatology regarding recurrent squamous cell carcinomas of the skin.   Objective: Blood pressure 136/91, pulse 87, temperature 97.5 F (36.4 C), temperature source Oral, height 5\' 4"  (1.626 m), weight 208 lb 12.8 oz (94.711 kg).  Oropharynx is without thrush or ulceration. No palpable cervical, supraclavicular, axillary or inguinal lymph nodes. Lungs are clear. Regular cardiac rhythm. Abdomen is soft and nontender. No organomegaly. Extremities are without edema. Calves are soft and nontender.  Lab Results: Lab Results  Component Value Date   WBC 4.0 02/17/2012   HGB 14.3 02/17/2012   HCT 41.6 02/17/2012   MCV 87.6 02/17/2012   PLT 141* 02/17/2012    Chemistry:    Chemistry      Component Value Date/Time   NA 142 01/06/2012 1225   K 4.4 01/06/2012 1225   CL 106 01/06/2012 1225   CO2 26 01/06/2012 1225   BUN 13 01/06/2012 1225   CREATININE 1.0 01/06/2012 1225      Component Value Date/Time   CALCIUM 9.7 01/06/2012 1225   ALKPHOS 66 01/06/2012 1225   AST 94* 01/06/2012 1225   ALT 159* 01/06/2012 1225   BILITOT 0.7 01/06/2012 1225       Studies/Results: Mr Abd W/wo Cm/mrcp  01/27/2012  *RADIOLOGY REPORT*  Clinical Data:  Abdominal pain.  Elevated liver function tests. Complex cystic renal lesion  seen on recent ultrasound.  MRI ABDOMEN WITHOUT AND WITH CONTRAST (MRCP)  Technique:  Multiplanar multisequence MR imaging of the abdomen was performed without and with contrast, including heavily T2-weighted images of the biliary and pancreatic ducts.  Three-dimensional MR images were rendered by post processing of the original MR data.  Contrast: 19mL MULTIHANCE GADOBENATE DIMEGLUMINE 529 MG/ML IV SOLN  Comparison:  Abdomen ultrasound 01/09/2012  Findings:  A subcapsular cyst is seen in the lateral lower pole right kidney, which shows a single thin internal septation, but no evidence of thickened septations, mural nodularity, or contrast enhancement.  This measures 2.7 x 1.9 cm, and is consistent with a benign Bosniak category II cyst.  No other complex cystic or solid renal masses are seen involving either kidney.  No evidence of hydronephrosis.  Mild hepatic steatosis is noted, but no liver masses are identified. The gallbladder, pancreas, spleen, and adrenal glands are normal in appearance.  No soft tissue masses or lymphadenopathy identified within the abdomen.  There is no evidence of inflammatory process or abnormal fluid collections.  There is no evidence of biliary ductal dilatation, with common bile duct measuring 6 mm in diameter.  No evidence of choledocholithiasis or biliary stricture.  No evidence of pancreatic ductal dilatation or pancreas divisum.  IMPRESSION:  1.  2.7 cm benign right renal cyst.  No evidence of renal neoplasm. 2.  Mild hepatic steatosis.  No evidence of hepatic neoplasm. 3.  No  evidence of biliary dilatation or choledocholithiasis. Normal pancreatic ductal anatomy.  Original Report Authenticated By: Danae Orleans, M.D.    Medications: I have reviewed the patient's current medications.  Assessment/Plan:  1. CLL. She remains in hematologic remission. 2. Hypogammaglobulinemia with recurrent sinopulmonary infections. She is currently on a trial of monthly IVIG. No recent  infections. 3. Intermittent diarrhea. She is scheduled for a colonoscopy next month. 4. Elevation of transaminases on 01/06/2012. MRI on 01/28/2012 with mild hepatic steatosis. We are obtaining a repeat LFTs today. 5. Recurrent squamous cell carcinomas of the skin. She continues close followup with dermatology. 6. History of herpes zoster infection. 7. History of herpes simplex virus 2.  Disposition-Bianca Parker appears stable. Plan to continue monthly IVIG. She will return for a followup visit in 2 months. She will contact the office in the interim with any problems.   Lonna Cobb ANP/GNP-BC

## 2012-02-17 NOTE — Patient Instructions (Addendum)

## 2012-02-18 ENCOUNTER — Other Ambulatory Visit: Payer: Self-pay

## 2012-02-18 ENCOUNTER — Other Ambulatory Visit (INDEPENDENT_AMBULATORY_CARE_PROVIDER_SITE_OTHER): Payer: 59

## 2012-02-18 DIAGNOSIS — R197 Diarrhea, unspecified: Secondary | ICD-10-CM

## 2012-02-18 LAB — IGG, IGA, IGM
IgA: <6 mg/dL — ABNORMAL LOW (ref 69–380)
IgG (Immunoglobin G), Serum: 899 mg/dL (ref 690–1700)

## 2012-02-18 LAB — IBC PANEL
Saturation Ratios: 28.1 % (ref 20.0–50.0)
Transferrin: 239.2 mg/dL (ref 212.0–360.0)

## 2012-02-19 LAB — ALPHA-1-ANTITRYPSIN: A-1 Antitrypsin, Ser: 114 mg/dL (ref 90–200)

## 2012-02-19 LAB — ANA: Anti Nuclear Antibody(ANA): POSITIVE — AB

## 2012-02-19 LAB — ANTI-NUCLEAR AB-TITER (ANA TITER): ANA Titer 1: 1:40 {titer} — ABNORMAL HIGH

## 2012-02-19 LAB — ANTI-SMOOTH MUSCLE ANTIBODY, IGG: Smooth Muscle Ab: 8 U (ref <20)

## 2012-02-23 LAB — MITOCHONDRIAL ANTIBODIES: Mitochondrial M2 Ab, IgG: 0.91 — ABNORMAL HIGH (ref <0.91)

## 2012-02-25 ENCOUNTER — Ambulatory Visit (AMBULATORY_SURGERY_CENTER): Payer: 59 | Admitting: Gastroenterology

## 2012-02-25 ENCOUNTER — Encounter: Payer: Self-pay | Admitting: Gastroenterology

## 2012-02-25 VITALS — BP 183/106 | HR 72 | Temp 98.5°F | Resp 18 | Ht 64.0 in | Wt 207.0 lb

## 2012-02-25 DIAGNOSIS — D126 Benign neoplasm of colon, unspecified: Secondary | ICD-10-CM

## 2012-02-25 DIAGNOSIS — K5730 Diverticulosis of large intestine without perforation or abscess without bleeding: Secondary | ICD-10-CM

## 2012-02-25 DIAGNOSIS — D1330 Benign neoplasm of unspecified part of small intestine: Secondary | ICD-10-CM

## 2012-02-25 DIAGNOSIS — R197 Diarrhea, unspecified: Secondary | ICD-10-CM

## 2012-02-25 MED ORDER — SODIUM CHLORIDE 0.9 % IV SOLN: 500.0000 mL | INTRAVENOUS | Status: DC

## 2012-02-25 NOTE — Op Note (Signed)
[facility] [address] [place], [place]  [address]  ENDOSCOPY PROCEDURE REPORT  PATIENT:  Bianca Parker, Bianca Parker  MR#:  [id number] BIRTHDATE:  [birth year], 66 yrs. old  GENDER:  female ENDOSCOPIST:  Rachael Fee, MD PROCEDURE DATE:  02/25/2012 PROCEDURE:  EGD with biopsy, 43239 ASA CLASS:  Class III INDICATIONS:  diarrhea; has low IG levels which is associated with diarrhea from many causes, one of which is Sprue-like villous blunting of small intestine MEDICATIONS:  There was residual sedation effect present from prior procedure., These medications were titrated to patient response per physician's verbal order TOPICAL ANESTHETIC:  Cetacaine Spray  DESCRIPTION OF PROCEDURE:   After the risks benefits and alternatives of the procedure were thoroughly explained, informed consent was obtained.  The [facility] [id number] [id number] endoscope was introduced through the mouth and advanced to the second portion of the duodenum, without limitations.  The instrument was slowly withdrawn as the mucosa was fully examined. <<PROCEDUREIMAGES>>  The upper, middle, and distal third of the esophagus were carefully inspected and no abnormalities were noted. The z-line was well seen at the GEJ. The endoscope was pushed into the fundus which was normal including a retroflexed view. The antrum,gastric body, first and second part of the duodenum were unremarkable. Duodenum was biopsied and sent to pathology (see image1, image2, and image3).    Retroflexed views revealed no abnormalities. The scope was then withdrawn from the patient and the procedure completed.  COMPLICATIONS:  None  ENDOSCOPIC IMPRESSION: 1) Normal EGD, duodenum biopsied to check for Sprue-like changes (villous blunting)  RECOMMENDATIONS: Await final pathology results.  ______________________________ Rachael Fee, MD  cc: Rene Paci, MD  n. eSIGNED:   Rachael Fee at 02/25/2012 03:14 PM  Merrily Pew,  [phone]

## 2012-02-25 NOTE — Patient Instructions (Addendum)
Discharge instructions given with verbal understanding. Handouts on polyps and diverticulosis given. Resume previous medications. YOU HAD AN ENDOSCOPIC PROCEDURE TODAY AT THE [facility]: Refer to the procedure report that was given to you for any specific questions about what was found during the examination.  If the procedure report does not answer your questions, please call your gastroenterologist to clarify.  If you requested that your care partner not be given the details of your procedure findings, then the procedure report has been included in a sealed envelope for you to review at your convenience later.  YOU SHOULD EXPECT: Some feelings of bloating in the abdomen. Passage of more gas than usual.  Walking can help get rid of the air that was put into your GI tract during the procedure and reduce the bloating. If you had a lower endoscopy (such as a colonoscopy or flexible sigmoidoscopy) you may notice spotting of blood in your stool or on the toilet paper. If you underwent a bowel prep for your procedure, then you may not have a normal bowel movement for a few days.  DIET: Your first meal following the procedure should be a light meal and then it is ok to progress to your normal diet.  A half-sandwich or bowl of soup is an example of a good first meal.  Heavy or fried foods are harder to digest and may make you feel nauseous or bloated.  Likewise meals heavy in dairy and vegetables can cause extra gas to form and this can also increase the bloating.  Drink plenty of fluids but you should avoid alcoholic beverages for 24 hours.  ACTIVITY: Your care partner should take you home directly after the procedure.  You should plan to take it easy, moving slowly for the rest of the day.  You can resume normal activity the day after the procedure however you should NOT DRIVE or use heavy machinery for 24 hours (because of the sedation medicines used during the test).    SYMPTOMS TO REPORT  IMMEDIATELY: A gastroenterologist can be reached at any hour.  During normal business hours, 8:30 AM to 5:00 PM Monday through Friday, call [phone].  After hours and on weekends, please call the GI answering service at [phone] who will take a message and have the physician on call contact you.   Following lower endoscopy (colonoscopy or flexible sigmoidoscopy):  Excessive amounts of blood in the stool  Significant tenderness or worsening of abdominal pains  Swelling of the abdomen that is new, acute  Fever of 100F or higher  Following upper endoscopy (EGD)  Vomiting of blood or coffee ground material  New chest pain or pain under the shoulder blades  Painful or persistently difficult swallowing  New shortness of breath  Fever of 100F or higher  Black, tarry-looking stools  FOLLOW UP: If any biopsies were taken you will be contacted by phone or by letter within the next 1-3 weeks.  Call your gastroenterologist if you have not heard about the biopsies in 3 weeks.  Our staff will call the home number listed on your records the next business day following your procedure to check on you and address any questions or concerns that you may have at that time regarding the information given to you following your procedure. This is a courtesy call and so if there is no answer at the home number and we have not heard from you through the emergency physician on call, we will assume that you   have returned to your regular daily activities without incident.  SIGNATURES/CONFIDENTIALITY: You and/or your care partner have signed paperwork which will be entered into your electronic medical record.  These signatures attest to the fact that that the information above on your After Visit Summary has been reviewed and is understood.  Full responsibility of the confidentiality of this discharge information lies with you and/or your care-partner.  

## 2012-02-25 NOTE — Op Note (Signed)
 [facility] [address] [place], [place]  [address]  COLONOSCOPY PROCEDURE REPORT PATIENT:  Bianca, Parker  MR#:  [id number] BIRTHDATE:  Jul 27, 1946, 66 yrs. old  GENDER:  female ENDOSCOPIST:  Rachael Fee, MD REF. BY:  Rene Paci, M.D. PROCEDURE DATE:  02/25/2012 PROCEDURE:  Colonoscopy with biopsy ASA CLASS:  Class III INDICATIONS:  chronic diarrhea, low immunoglobulin levels. MEDICATIONS:   Fentanyl 75 mcg IV, These medications were titrated to patient response per physician's verbal order, Versed 8 mg IV DESCRIPTION OF PROCEDURE:   After the risks benefits and alternatives of the procedure were thoroughly explained, informed consent was obtained.  Digital rectal exam was performed and revealed no rectal masses.   The [facility] [id number] endoscope was introduced through the anus and advanced to the terminal ileum which was intubated for a short distance, without limitations. The quality of the prep was good..  The instrument was then slowly withdrawn as the colon was fully examined. <<PROCEDUREIMAGES>> FINDINGS:  The terminal ileum appeared normal (see image5).  A diminutive polyp was found in the sigmoid colon. This was removed and sent to pathology (jar 2) (see image6).  Moderate diverticulosis was found in the sigmoid to descending colon segments. This was associated with edema, erythema, tortuous colon in this segment (see image2).  This was otherwise a normal examination of the colon. Random biopsies were taken from colon and sent to pathology (jar 1) (see image3 and image7). Retroflexed views in the rectum revealed no abnormalities. COMPLICATIONS:  None  ENDOSCOPIC IMPRESSION: 1) Normal terminal ileum 2) Diminutive polyp in the sigmoid colon, this was removed and sent to pathology 3) Moderate diverticulosis in the sigmoid to descending colon segments 4) Otherwise normal examination; colon randomly biopsied to check for microscopic  colitis RECOMMENDATIONS: 1) If the polyp(s) removed today are proven to be adenomatous (pre-cancerous) polyps, you will need a repeat colonoscopy in 5 years. Otherwise you should continue to follow colorectal cancer screening guidelines for "routine risk" patients with colonoscopy in 10 years. You will receive a letter within 1-2 weeks with the results of your biopsy as well as final recommendations. Please call my office if you have not received a letter after 3 weeks. 2) Await random colon biopsies for diarrhea workup  ______________________________ Rachael Fee, MD  n. eSIGNED:   Rachael Fee at 02/25/2012 03:10 PM  Merrily Pew, [phone]

## 2012-02-27 ENCOUNTER — Telehealth: Payer: Self-pay | Admitting: *Deleted

## 2012-02-27 NOTE — Telephone Encounter (Signed)
  Follow up Call-  Call back number 02/25/2012  Post procedure Call Back phone  # home [phone]   cell -[phone]  Permission to leave phone message Yes     Patient questions:  Do you have a fever, pain , or abdominal swelling? no Pain Score  0 *  Have you tolerated food without any problems? yes  Have you been able to return to your normal activities? yes  Do you have any questions about your discharge instructions: Diet   no Medications  no Follow up visit  no  Do you have questions or concerns about your Care? no  Actions: * If pain score is 4 or above: No action needed, pain <4.pt did say on f/u call back that she had a mild fever of 99.4 and some diarrhea (2-3 loose stools) yesterday, but all that has resolved today . Discussed with pt that she could have been on the dehydrated side and the need to drink plenty of liquids which she says she did do yesterday . She also said she took imodium after having loose stools yesterday. We talked about she was possibly still having some effects  from prep. Pt  Also said she had some headache yesterday which has also resolved . Pt encouraged to call us back if she doesn't continue to improve.

## 2012-03-10 ENCOUNTER — Telehealth: Payer: Self-pay | Admitting: Gastroenterology

## 2012-03-10 NOTE — Telephone Encounter (Signed)
Pt calling for path report from procedure 02/25/12. Path report reviewed with Mike Gip PA and results discussed with pt. Pt will wait for letter in the mail and would also like to schedule an appt to discuss what she can do to manage her symptoms. Dr. Christella Hartigan please advise.

## 2012-03-15 NOTE — Telephone Encounter (Signed)
See results note recently.

## 2012-03-16 ENCOUNTER — Ambulatory Visit (HOSPITAL_BASED_OUTPATIENT_CLINIC_OR_DEPARTMENT_OTHER): Payer: 59

## 2012-03-16 ENCOUNTER — Other Ambulatory Visit (HOSPITAL_BASED_OUTPATIENT_CLINIC_OR_DEPARTMENT_OTHER): Payer: 59 | Admitting: Lab

## 2012-03-16 VITALS — BP 134/86 | HR 75 | Temp 98.1°F

## 2012-03-16 DIAGNOSIS — D801 Nonfamilial hypogammaglobulinemia: Secondary | ICD-10-CM

## 2012-03-16 DIAGNOSIS — D839 Common variable immunodeficiency, unspecified: Secondary | ICD-10-CM

## 2012-03-16 LAB — CBC WITH DIFFERENTIAL/PLATELET
BASO%: 0.2 % (ref 0.0–2.0)
EOS%: 1.8 % (ref 0.0–7.0)
MCH: 30.4 pg (ref 25.1–34.0)
MCHC: 34.5 g/dL (ref 31.5–36.0)
MONO%: 9.9 % (ref 0.0–14.0)
NEUT%: 46.3 % (ref 38.4–76.8)
RDW: 13.6 % (ref 11.2–14.5)
lymph#: 1.9 10*3/uL (ref 0.9–3.3)

## 2012-03-16 MED ORDER — METHYLPREDNISOLONE SODIUM SUCC 40 MG IJ SOLR
40.0000 mg | Freq: Once | INTRAMUSCULAR | Status: AC
  Administered 2012-03-16: 40 mg via INTRAVENOUS

## 2012-03-16 MED ORDER — IMMUNE GLOBULIN (HUMAN) 20 GM/200ML IV SOLN
35.0000 g | Freq: Once | INTRAVENOUS | Status: AC
  Administered 2012-03-16: 35 g via INTRAVENOUS
  Filled 2012-03-16: qty 350

## 2012-03-16 MED ORDER — ACETAMINOPHEN 325 MG PO TABS
650.0000 mg | ORAL_TABLET | Freq: Once | ORAL | Status: AC
  Administered 2012-03-16: 650 mg via ORAL

## 2012-03-16 MED ORDER — DIPHENHYDRAMINE HCL 25 MG PO CAPS
25.0000 mg | ORAL_CAPSULE | Freq: Once | ORAL | Status: AC
  Administered 2012-03-16: 25 mg via ORAL

## 2012-03-16 NOTE — Patient Instructions (Signed)
[facility] Discharge Instructions for Patients Receiving Chemotherapy  Today you received the following: immune globulin   To help prevent nausea and vomiting after your treatment, we encourage you to take your nausea medication. Take it as often as prescribed.    If you develop nausea and vomiting that is not controlled by your nausea medication, call the clinic. If it is after clinic hours your family physician or the after hours number for the clinic or go to the Emergency Department.   BELOW ARE SYMPTOMS THAT SHOULD BE REPORTED IMMEDIATELY:  *FEVER GREATER THAN 100.5 F  *CHILLS WITH OR WITHOUT FEVER  NAUSEA AND VOMITING THAT IS NOT CONTROLLED WITH YOUR NAUSEA MEDICATION  *UNUSUAL SHORTNESS OF BREATH  *UNUSUAL BRUISING OR BLEEDING  TENDERNESS IN MOUTH AND THROAT WITH OR WITHOUT PRESENCE OF ULCERS  *URINARY PROBLEMS  *BOWEL PROBLEMS  UNUSUAL RASH Items with * indicate a potential emergency and should be followed up as soon as possible.  Feel free to call the clinic you have any questions or concerns. The clinic phone number is [phone].   I have been informed and understand all the instructions given to me. I know to contact the clinic, my physician, or go to the Emergency Department if any problems should occur. I do not have any questions at this time, but understand that I may call the clinic during office hours   should I have any questions or need assistance in obtaining follow up care.    __________________________________________  _____________  __________ Signature of Patient or Authorized Representative            Date                   Time    __________________________________________ Nurse's Signature

## 2012-03-17 IMAGING — CR DG CHEST 2V
2 series · 2 of 2 positions shown · non-contrast
Comparison: 02/04/2010

CLINICAL DATA: Cough and fever

CHEST - 2 VIEW

[view not recorded (1 of 2)]
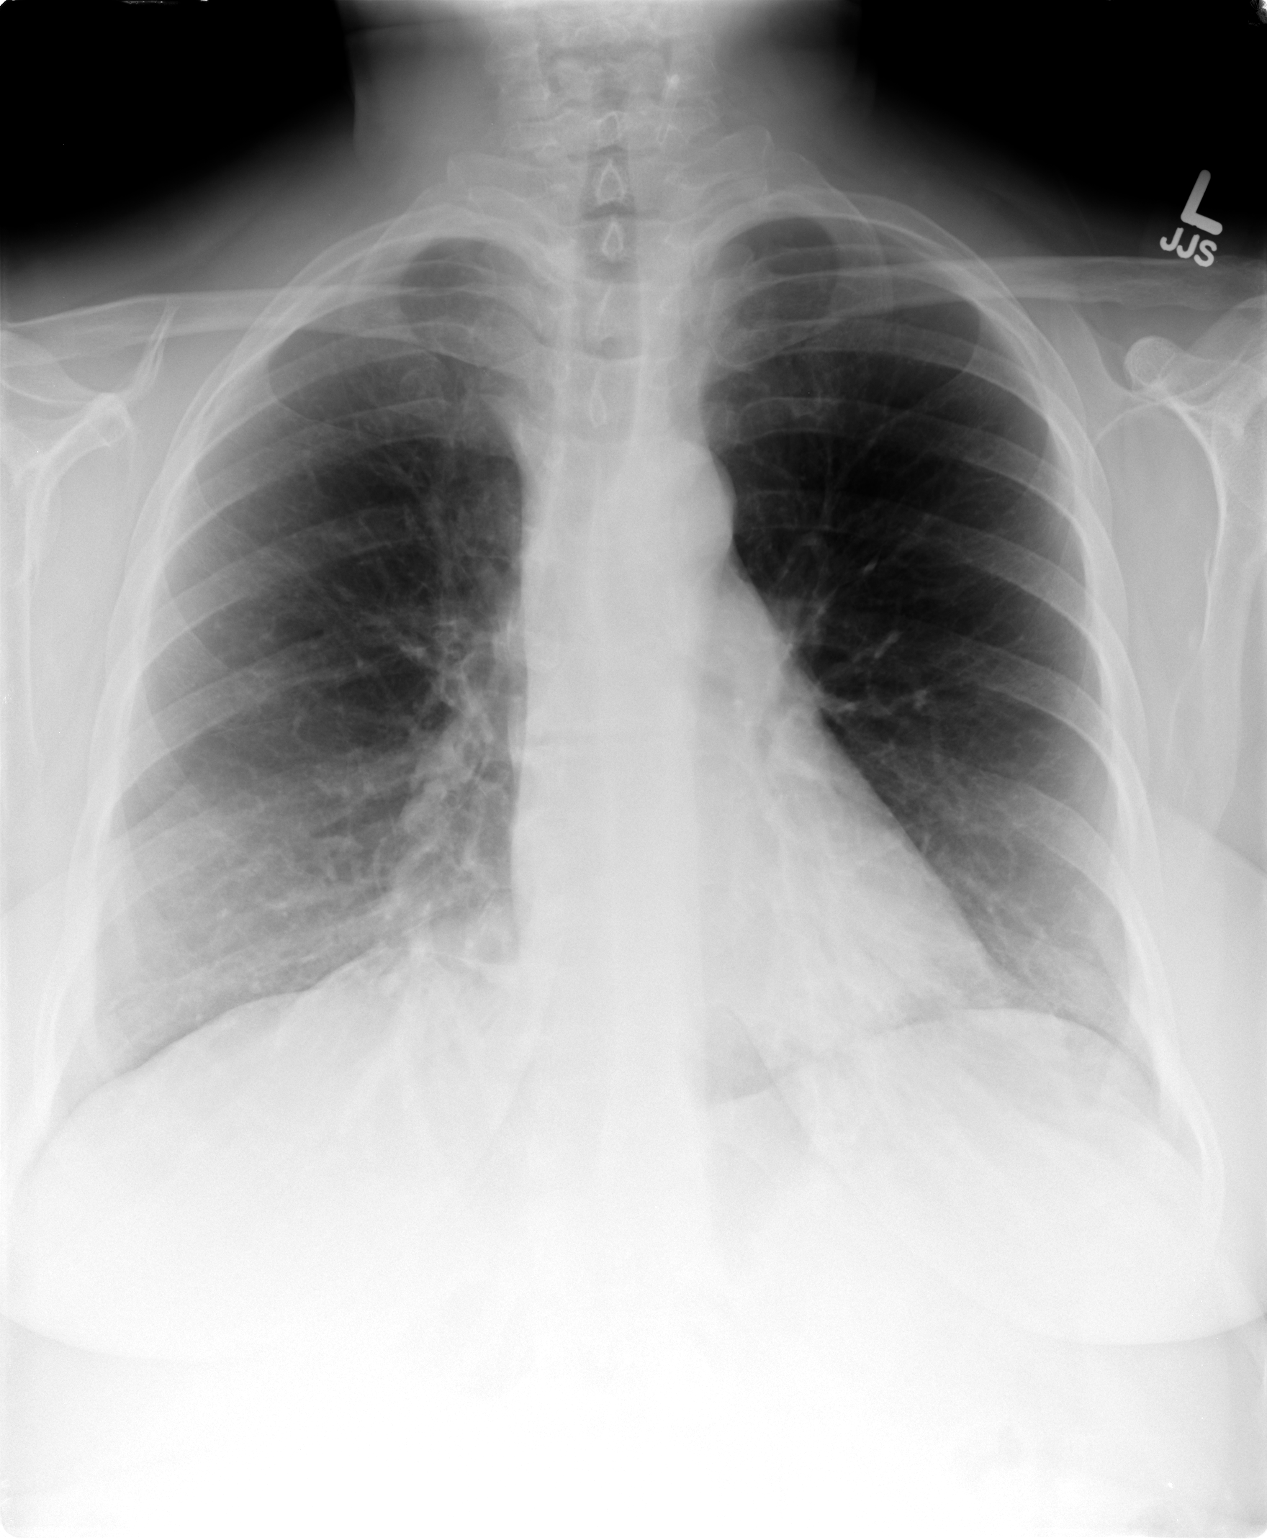

[view not recorded (2 of 2)]
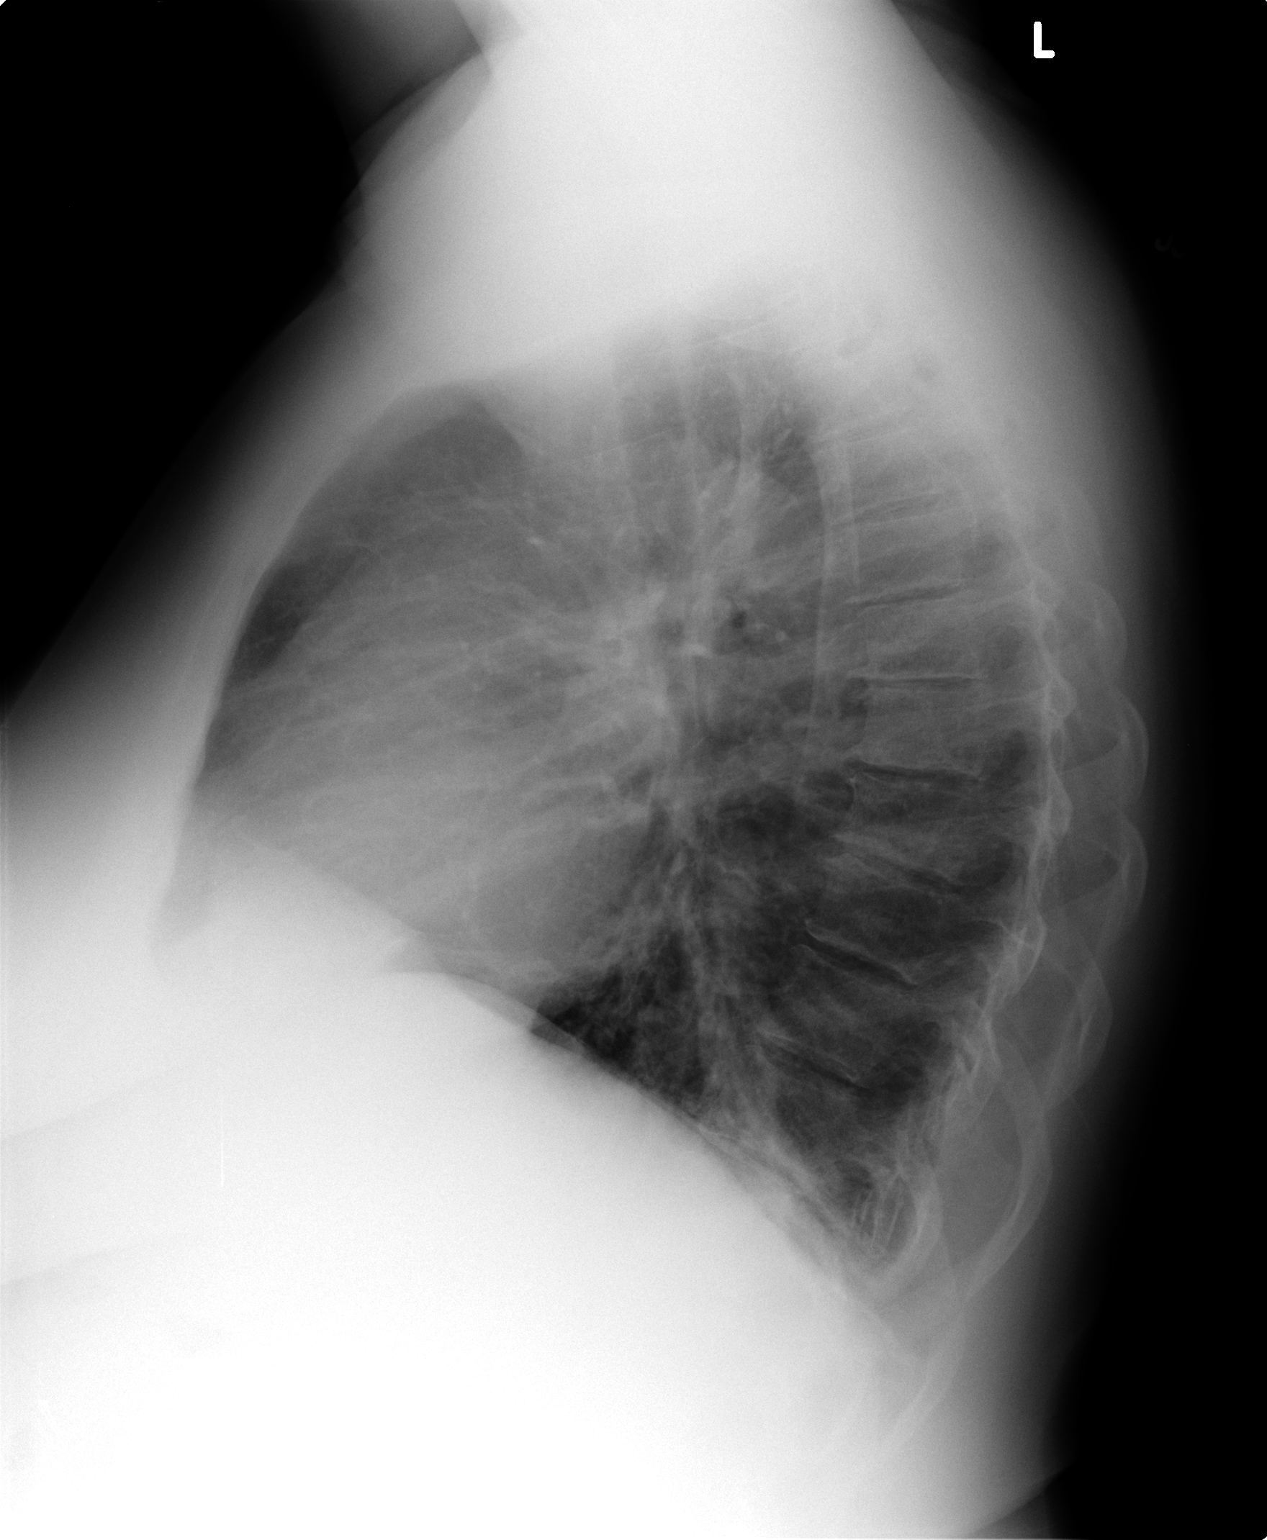

[2 of 2 positions shown; findings below may reference images not displayed]

FINDINGS: Pulmonary hyperinflation with flattening of the
hemidiaphragm bilaterally.  Left lower lobe airspace disease has
developed since the prior study.  This may be due to atelectasis or
pneumonia.  No significant effusion.  Negative for heart failure.
IMPRESSION: Left lower lobe airspace disease, possible pneumonia.

COPD.

## 2012-04-06 ENCOUNTER — Ambulatory Visit (HOSPITAL_BASED_OUTPATIENT_CLINIC_OR_DEPARTMENT_OTHER): Payer: 59 | Admitting: Oncology

## 2012-04-06 ENCOUNTER — Telehealth: Payer: Self-pay | Admitting: Oncology

## 2012-04-06 ENCOUNTER — Telehealth: Payer: Self-pay | Admitting: *Deleted

## 2012-04-06 ENCOUNTER — Encounter: Payer: Self-pay | Admitting: Oncology

## 2012-04-06 ENCOUNTER — Other Ambulatory Visit (HOSPITAL_BASED_OUTPATIENT_CLINIC_OR_DEPARTMENT_OTHER): Payer: 59 | Admitting: Lab

## 2012-04-06 VITALS — BP 147/89 | HR 95 | Temp 98.2°F | Resp 18 | Ht 64.5 in | Wt 215.1 lb

## 2012-04-06 DIAGNOSIS — C4492 Squamous cell carcinoma of skin, unspecified: Secondary | ICD-10-CM

## 2012-04-06 DIAGNOSIS — K769 Liver disease, unspecified: Secondary | ICD-10-CM

## 2012-04-06 DIAGNOSIS — R768 Other specified abnormal immunological findings in serum: Secondary | ICD-10-CM

## 2012-04-06 DIAGNOSIS — C9111 Chronic lymphocytic leukemia of B-cell type in remission: Secondary | ICD-10-CM

## 2012-04-06 DIAGNOSIS — D801 Nonfamilial hypogammaglobulinemia: Secondary | ICD-10-CM

## 2012-04-06 DIAGNOSIS — D839 Common variable immunodeficiency, unspecified: Secondary | ICD-10-CM

## 2012-04-06 HISTORY — DX: Other specified abnormal immunological findings in serum: R76.8

## 2012-04-06 LAB — COMPREHENSIVE METABOLIC PANEL
AST: 38 U/L — ABNORMAL HIGH (ref 0–37)
Alkaline Phosphatase: 62 U/L (ref 39–117)
BUN: 16 mg/dL (ref 6–23)
Chloride: 107 mEq/L (ref 96–112)
Glucose, Bld: 93 mg/dL (ref 70–99)
Potassium: 4.2 mEq/L (ref 3.5–5.3)
Sodium: 143 mEq/L (ref 135–145)
Total Bilirubin: 0.4 mg/dL (ref 0.3–1.2)
Total Protein: 7.1 g/dL (ref 6.0–8.3)

## 2012-04-06 LAB — CBC WITH DIFFERENTIAL/PLATELET
Basophils Absolute: 0 10*3/uL (ref 0.0–0.1)
EOS%: 1.5 % (ref 0.0–7.0)
Eosinophils Absolute: 0.1 10*3/uL (ref 0.0–0.5)
HCT: 44.3 % (ref 34.8–46.6)
LYMPH%: 41.7 % (ref 14.0–49.7)
MCH: 30.1 pg (ref 25.1–34.0)
MCV: 90.2 fL (ref 79.5–101.0)
MONO#: 0.4 10*3/uL (ref 0.1–0.9)
MONO%: 8.5 % (ref 0.0–14.0)
NEUT#: 2.3 10*3/uL (ref 1.5–6.5)
Platelets: 157 10*3/uL (ref 145–400)
RBC: 4.92 10*6/uL (ref 3.70–5.45)
RDW: 14.4 % (ref 11.2–14.5)
lymph#: 2 10*3/uL (ref 0.9–3.3)

## 2012-04-06 NOTE — Telephone Encounter (Signed)
Called pt and left message regarding IVIG appt , advised pt to get a calendar for the next few months

## 2012-04-06 NOTE — Telephone Encounter (Signed)
Per staff message and POF I have scheduled appts.  JMW  

## 2012-04-06 NOTE — Progress Notes (Signed)
Hematology and Oncology Follow Up Visit  Bianca Parker [id number] 03-Jun-1946 66 y.o. 04/06/2012 2:55 PM   Principle Diagnosis: Encounter Diagnoses  Name Primary?  . Hepatitis A antibody positive   . Chronic lymphoid leukemia in remission Yes  . Hypogammaglobulinemia, acquired   . Hepatitis B antibody positive      Interim History:   Followup visit for this 66 year old woman diagnosed with Rai stage III chronic lymphocytic leukemia back in 02/2001. She had anemia and leukocytosis with lymphocytosis at diagnosis, but no adenopathy or organomegaly. She was treated on [facility] protocol, and was one of the first cohorts to receive FCR, started in 12/2001, and continued through 06/2002. She achieved a complete hematologic response and I believe also a molecular response by PCR, which has been durable now for almost 10 years!  She did develop hypogammaglobulinemia. As a result, she gets recurrent bronchitis and sinusitis. She has had a particularly bad year with a sinopulmonary infections just about every month for the last 11 months. I last checked immunoglobulin levels back in July 2012 and they remained low with serum IgG 160 mg percent, IgA less than 5 mg percent, and IgM 4 mg percent on 03/25/2011. I started her on a program of monthly intravenous immunoglobulin 400 mg per kilogram in April of this year. She had problems with the first dose with fevers and atypical pain. Steroids were added as a premedication to subsequent doses which he has tolerated well. She has not had a single respiratory infection since starting on the IVIG.  She's had problems with recurrent squamous cell carcinomas of her skin and has had 2 lesions excised from her left tibial area and most recently 1 excised from the vulvar area. She has a history of both herpes zoster and herpes simplex infections.  She developed a diarrheal illness back in May. I did not think that this was due to the IVIG. She was evaluated by  her internist and referred to gastroenterology and had a very exhaustive evaluation by Dr. Wendall Parker. She had mild transaminase elevations recorded on May 14 with SGOT 94 SGPT 159 bilirubin 0.1. Hepatitis B surface antigen negative surface antibody positive. No prior hepatitis vaccine. Hepatitis A IgM? Positive. Hepatitis C negative. Stool was negative for routine bacterial pathogens, C. difficile, ova and parasites. Ultrasound of the liver was unremarkable. MRI of the liver showed mild fatty deposition. Upper and lower endoscopy was normal. She was treated symptomatically and the diarrhea resolved.   Medications: reviewed  Allergies:  Allergies  Allergen Reactions  . Penicillins   . Pentamidine   . Sulfonamide Derivatives     Review of Systems: Constitutional:   No constitutional symptoms at this time Respiratory: No cough or dyspnea Cardiovascular:  No chest pain or palpitations Gastrointestinal: See above Genito-Urinary: No urinary tract symptoms Musculoskeletal: No muscle or bone pain Neurologic: No headache or change in vision Skin: No new cutaneous tumors Remaining ROS negative.  Physical Exam: Blood pressure 147/89, pulse 95, temperature 98.2 F (36.8 C), temperature source Oral, resp. rate 18, height 5' 4.5" (1.638 m), weight 215 lb 1.6 oz (97.569 kg). Wt Readings from Last 3 Encounters:  04/06/12 215 lb 1.6 oz (97.569 kg)  02/25/12 207 lb (93.895 kg)  02/17/12 208 lb 12.8 oz (94.711 kg)     General appearance: Well-nourished Caucasian woman HENNT: Pharynx no erythema or exudate Lymph nodes: No cervical supraclavicular or axillary adenopathy Breasts: Not examined Lungs: Clear to auscultation resonant to percussion Heart: Regular rhythm no  murmur Abdomen: Soft nontender no mass no organomegaly Extremities: No edema no calf tenderness Vascular: No cyanosis Neurologic: No focal deficit Skin: No rash or tumor  Lab Results: White count differential: 48% neutrophils  42% lymphocytes 8% monocytes Lab Results  Component Value Date   WBC 4.9 04/06/2012   HGB 14.8 04/06/2012   HCT 44.3 04/06/2012   MCV 90.2 04/06/2012   PLT 157 04/06/2012     Chemistry      Component Value Date/Time   NA 139 02/17/2012 1136   K 4.1 02/17/2012 1136   CL 104 02/17/2012 1136   CO2 21 02/17/2012 1136   BUN 15 02/17/2012 1136   CREATININE 0.90 02/17/2012 1136      Component Value Date/Time   CALCIUM 9.7 02/17/2012 1136   ALKPHOS 72 02/17/2012 1136   AST 33 02/17/2012 1136   ALT 36* 02/17/2012 1136   BILITOT 0.3 02/17/2012 1136       Radiological Studies: No results found.  Impression and Plan: #1.Rai stage III chronic lymphocytic leukemia She remains in a hematologic remission now out over 10 years from initiation of FCR chemotherapy/immunotherapy.  #2. Hypogammaglobulinemia secondary to #1 Nice response to monthly immunoglobulin therapy with resolution of recurrent sinopulmonary infections. Plan: I will continue monthly IVIG through April of 2014 to complete one year and then stop and observe.  #3. Recent diarrheal illness with mild liver function abnormalities. It is very difficult to interpret the serology data on her hepatitis panel since she is receiving monthly intravenous immunoglobulin therapy. We may be seeing passive transfer of antibodies giving a false positive hepatitis A test. Her liver enzymes were really not impressively high so I doubt that she had acute hepatitis A infection. Although intravenous immunoglobulin can reactivate hepatitis B infection, she has been screened for this in the past prior to starting the product and was negative. Currently negative hepatitis B surface antigen positive surface antibody. Her GI symptoms have now resolved. No focal abnormalities on scans. Normal upper and lower endoscopy.  #4. Recurrent squamous cell carcinomas of the skin.  #5. History of herpes zoster infection  #6. History of herpes simplex 2 infection.   CC:.  Dr. Rene Parker; Dr. Rob Parker; Dr. Tobi Parker. Bianca Parker cancer Bianca Parker   Bianca Parker, Bianca Parker 8/13/20132:55 PM

## 2012-04-08 ENCOUNTER — Other Ambulatory Visit: Payer: Self-pay | Admitting: Oncology

## 2012-04-12 ENCOUNTER — Other Ambulatory Visit: Payer: Self-pay | Admitting: Gastroenterology

## 2012-04-12 ENCOUNTER — Encounter: Payer: Self-pay | Admitting: Gastroenterology

## 2012-04-12 ENCOUNTER — Ambulatory Visit (INDEPENDENT_AMBULATORY_CARE_PROVIDER_SITE_OTHER): Payer: 59 | Admitting: Gastroenterology

## 2012-04-12 ENCOUNTER — Other Ambulatory Visit (INDEPENDENT_AMBULATORY_CARE_PROVIDER_SITE_OTHER): Payer: 59

## 2012-04-12 VITALS — BP 130/80 | HR 76 | Ht 64.0 in | Wt 216.0 lb

## 2012-04-12 DIAGNOSIS — R7989 Other specified abnormal findings of blood chemistry: Secondary | ICD-10-CM

## 2012-04-12 DIAGNOSIS — R197 Diarrhea, unspecified: Secondary | ICD-10-CM

## 2012-04-12 LAB — HEPATIC FUNCTION PANEL
ALT: 43 U/L — ABNORMAL HIGH (ref 0–35)
Albumin: 4.3 g/dL (ref 3.5–5.2)
Bilirubin, Direct: 0.1 mg/dL (ref 0.0–0.3)
Total Protein: 7.4 g/dL (ref 6.0–8.3)

## 2012-04-12 NOTE — Patient Instructions (Addendum)
You will have labs checked today in the basement lab.  Please head down after you check out with the front desk  (hepatitis A Ab IgM and hepatitis A Ab IgG). Repeat LFTs in one month and if normal then will recommend retrial of statin (which were started this past march) which MAY have been responsible for LFT elevation (and diarrhea as well?). Continue on twice imodium for now, can try weaning back down to once a day. Your liver test elevation was previously worked up with bloodwork. We will help with coding for previous labs however possible.

## 2012-04-12 NOTE — Progress Notes (Signed)
Review of pertinent gastrointestinal problems: 1. Acquired immune deficiency from chemo 2. Chronic diarrhea (? From #1?); 7/13 Colonoscopy to TI normal, random biopsies negative for colitis; EGD 7/13 normal, biopsies of duodenum negative for Celiac.  Stool cul 3. Routine risk for colon cancer: colonoscopy 7/13 with HP only, also diverticulosis, repeat colon in 10 years.   HPI: This is a   very pleasant 66 year old woman whom I last saw the time of colonoscopy and upper endoscopy.  Her diarrhea is very well controlled on 1-2 Imodium a day. Her liver tests are improving. It is unclear what caused these. Her hepatitis A total antibody for hepatitis a was positive. I think it would be very unlikely if she had acute hepatitis a. This could represent prior immunity, also could represent passive immunity from her IVIG treatments. Her liver tests are improving we will repeat them in one month's time. Perhaps the statin was causing some of her liver test elevation and also Lipitor can cause diarrhea,.   Recent AST and ALT much improved (30-50 each).    Past Medical History  Diagnosis Date  . Anemia   . CARCINOMA, SKIN, SQUAMOUS CELL   . Allergic rhinitis, cause unspecified   . Chronic lymphoid leukemia in remission 2002 dx, tx 2003  . Dyslipidemia   . Hypogammaglobulinemia, acquired 11/18/2011    Due to CLL and prior chemo  . Hyperlipidemia   . Hepatitis A antibody positive 04/06/2012    Acute diarrheal illness & elevated liver function tests 02/18/12;     Past Surgical History  Procedure Date  . Abdominal hysterectomy 1966  . Tonsillectomy   . Bunionectomy   . Sigmoidoscopy     Current Outpatient Prescriptions  Medication Sig Dispense Refill  . diphenhydrAMINE (BENADRYL) 25 MG tablet Take 25 mg by mouth at bedtime as needed.        . fexofenadine (ALLEGRA) 180 MG tablet Take 180 mg by mouth daily.        . Loperamide HCl (IMODIUM A-D PO) Take by mouth as needed.      . Probiotic  Product (PHILLIPS COLON HEALTH) CAPS Take by mouth daily.        . diphenoxylate-atropine (LOMOTIL) 2.5-0.025 MG per tablet Uses as directed      . hydrocortisone-pramoxine (ANALPRAM-HC) 2.5-1 % rectal cream Place at anus 2-3 times daily ss needed  30 g  0    Allergies as of 04/12/2012 - Review Complete 04/12/2012  Allergen Reaction Noted  . Penicillins    . Pentamidine  11/14/2011  . Sulfonamide derivatives      Family History  Problem Relation Age of Onset  . Colon cancer Mother   . Breast cancer Other   . Ovarian cancer Other     History   Social History  . Marital Status: Married    Spouse Name: N/A    Number of Children: N/A  . Years of Education: N/A   Occupational History  . Not on file.   Social History Main Topics  . Smoking status: Never Smoker   . Smokeless tobacco: Never Used   Comment: Married, lives with husband [name]), grown children. 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 Alcohol Use: Yes     Rare  . Drug Use: No  . Sexually Active: Yes   Other Topics Concern  . Not on file   Social History Narrative  . No narrative on file      Physical Exam: BP 130/80  Pulse 76  Ht  5\' 4"  (1.626 m)  Wt 216 lb (97.977 kg)  BMI 37.08 kg/m2 Constitutional: generally well-appearing Psychiatric: alert and oriented x3 Abdomen: soft, nontender, nondistended, no obvious ascites, no peritoneal signs, normal bowel sounds     Assessment and plan: 66 y.o. female with transiently elevated liver tests, improving diarrhea  Repeat liver tests in one month. We'll possibly rechallenge with Lipitor at that point. She will continue on Imodium once to twice daily for now.

## 2012-04-13 ENCOUNTER — Ambulatory Visit (HOSPITAL_BASED_OUTPATIENT_CLINIC_OR_DEPARTMENT_OTHER): Payer: 59

## 2012-04-13 VITALS — BP 135/81 | HR 76 | Temp 97.2°F | Resp 20

## 2012-04-13 DIAGNOSIS — D801 Nonfamilial hypogammaglobulinemia: Secondary | ICD-10-CM

## 2012-04-13 MED ORDER — SODIUM CHLORIDE 0.9 % IV SOLN
INTRAVENOUS | Status: DC
  Administered 2012-04-13: 11:00:00 via INTRAVENOUS

## 2012-04-13 MED ORDER — METHYLPREDNISOLONE SODIUM SUCC 40 MG IJ SOLR
40.0000 mg | Freq: Once | INTRAMUSCULAR | Status: AC
  Administered 2012-04-13: 40 mg via INTRAVENOUS

## 2012-04-13 MED ORDER — ACETAMINOPHEN 325 MG PO TABS
650.0000 mg | ORAL_TABLET | Freq: Once | ORAL | Status: AC
  Administered 2012-04-13: 650 mg via ORAL

## 2012-04-13 MED ORDER — DIPHENHYDRAMINE HCL 25 MG PO CAPS
50.0000 mg | ORAL_CAPSULE | Freq: Four times a day (QID) | ORAL | Status: DC | PRN
  Administered 2012-04-13: 50 mg via ORAL

## 2012-04-13 MED ORDER — IMMUNE GLOBULIN (HUMAN) 20 GM/200ML IV SOLN
40.0000 g | Freq: Once | INTRAVENOUS | Status: AC
  Administered 2012-04-13: 40 g via INTRAVENOUS
  Filled 2012-04-13: qty 400

## 2012-04-13 NOTE — Patient Instructions (Addendum)
[facility] Discharge Instructions for Patients Receiving Chemotherapy  Today you received the following chemotherapy agents Privigen (IVIG).    BELOW ARE SYMPTOMS THAT SHOULD BE REPORTED IMMEDIATELY:  *FEVER GREATER THAN 100.5 F  *CHILLS WITH OR WITHOUT FEVER  NAUSEA AND VOMITING THAT IS NOT CONTROLLED WITH YOUR NAUSEA MEDICATION  *UNUSUAL SHORTNESS OF BREATH  *UNUSUAL BRUISING OR BLEEDING  TENDERNESS IN MOUTH AND THROAT WITH OR WITHOUT PRESENCE OF ULCERS  *URINARY PROBLEMS  *BOWEL PROBLEMS  UNUSUAL RASH Items with * indicate a potential emergency and should be followed up as soon as possible.  Feel free to call the clinic you have any questions or concerns.  The clinic phone number is [phone].

## 2012-05-10 ENCOUNTER — Other Ambulatory Visit: Payer: Self-pay

## 2012-05-11 ENCOUNTER — Ambulatory Visit (HOSPITAL_BASED_OUTPATIENT_CLINIC_OR_DEPARTMENT_OTHER): Payer: 59

## 2012-05-11 ENCOUNTER — Other Ambulatory Visit (INDEPENDENT_AMBULATORY_CARE_PROVIDER_SITE_OTHER): Payer: 59

## 2012-05-11 ENCOUNTER — Ambulatory Visit: Payer: 59

## 2012-05-11 VITALS — BP 116/69 | HR 69 | Temp 98.1°F | Resp 18

## 2012-05-11 DIAGNOSIS — D801 Nonfamilial hypogammaglobulinemia: Secondary | ICD-10-CM

## 2012-05-11 DIAGNOSIS — D839 Common variable immunodeficiency, unspecified: Secondary | ICD-10-CM

## 2012-05-11 LAB — TSH: TSH: 4.61 u[IU]/mL (ref 0.35–5.50)

## 2012-05-11 LAB — CBC WITH DIFFERENTIAL/PLATELET
Eosinophils Relative: 1 % (ref 0.0–5.0)
HCT: 43.5 % (ref 36.0–46.0)
Monocytes Relative: 6.9 % (ref 3.0–12.0)
Neutrophils Relative %: 67.8 % (ref 43.0–77.0)
Platelets: 142 10*3/uL — ABNORMAL LOW (ref 150.0–400.0)
WBC: 7.9 10*3/uL (ref 4.5–10.5)

## 2012-05-11 LAB — HEPATIC FUNCTION PANEL
AST: 35 U/L (ref 0–37)
Albumin: 4.1 g/dL (ref 3.5–5.2)
Albumin: 4.1 g/dL (ref 3.5–5.2)
Alkaline Phosphatase: 48 U/L (ref 39–117)
Total Protein: 7.2 g/dL (ref 6.0–8.3)

## 2012-05-11 LAB — BASIC METABOLIC PANEL
CO2: 22 mEq/L (ref 19–32)
Glucose, Bld: 94 mg/dL (ref 70–99)
Potassium: 3.8 mEq/L (ref 3.5–5.1)
Sodium: 133 mEq/L — ABNORMAL LOW (ref 135–145)

## 2012-05-11 MED ORDER — METHYLPREDNISOLONE SODIUM SUCC 40 MG IJ SOLR
40.0000 mg | Freq: Once | INTRAMUSCULAR | Status: AC
  Administered 2012-05-11: 40 mg via INTRAVENOUS

## 2012-05-11 MED ORDER — DIPHENHYDRAMINE HCL 25 MG PO CAPS
50.0000 mg | ORAL_CAPSULE | Freq: Four times a day (QID) | ORAL | Status: DC | PRN
  Administered 2012-05-11: 50 mg via ORAL

## 2012-05-11 MED ORDER — ACETAMINOPHEN 325 MG PO TABS
650.0000 mg | ORAL_TABLET | Freq: Once | ORAL | Status: AC
  Administered 2012-05-11: 650 mg via ORAL

## 2012-05-11 MED ORDER — IMMUNE GLOBULIN (HUMAN) 20 GM/200ML IV SOLN
40.0000 g | Freq: Once | INTRAVENOUS | Status: AC
  Administered 2012-05-11: 40 g via INTRAVENOUS
  Filled 2012-05-11: qty 400

## 2012-05-11 NOTE — Patient Instructions (Signed)

## 2012-05-11 NOTE — Progress Notes (Signed)
Infusion began at 1015.  1015 0.3 ml/kg/hr= 29 ml/hr x 15 ml vital signs stable. No complaints. Patient tolerated well.  1045 0.6 ml/kg/hr= 59 ml/hr x 30 ml. Vital signs stable. No complaints. Patient tolerated well.  1115 1.2 ml/kg/hr= 118 ml/hr x 59 ml. Vital signs stable. No complaints. Patient tolerated well.  1145 Vital signs stable. No complaints. Vitals will be monitored hourly until infusion completion.  Nursing will not increase the rate due to patient states when rate is increased she has "problems".  1245 Vital signs stable. No complaints.   1345. Vital signs stable. No complaints.

## 2012-05-13 ENCOUNTER — Encounter: Payer: Self-pay | Admitting: Internal Medicine

## 2012-05-13 ENCOUNTER — Ambulatory Visit (INDEPENDENT_AMBULATORY_CARE_PROVIDER_SITE_OTHER): Payer: 59 | Admitting: Internal Medicine

## 2012-05-13 VITALS — BP 118/72 | HR 87 | Temp 97.9°F | Resp 15 | Wt 219.0 lb

## 2012-05-13 DIAGNOSIS — D801 Nonfamilial hypogammaglobulinemia: Secondary | ICD-10-CM

## 2012-05-13 DIAGNOSIS — C9111 Chronic lymphocytic leukemia of B-cell type in remission: Secondary | ICD-10-CM

## 2012-05-13 DIAGNOSIS — D839 Common variable immunodeficiency, unspecified: Secondary | ICD-10-CM

## 2012-05-13 DIAGNOSIS — J328 Other chronic sinusitis: Secondary | ICD-10-CM

## 2012-05-13 DIAGNOSIS — E785 Hyperlipidemia, unspecified: Secondary | ICD-10-CM

## 2012-05-13 MED ORDER — MOXIFLOXACIN HCL 400 MG PO TABS: 400.0000 mg | ORAL_TABLET | Freq: Every day | ORAL | Status: AC

## 2012-05-13 NOTE — Progress Notes (Signed)
  Subjective:    Patient ID: Bianca Parker, female    DOB: 1945/10/14, 66 y.o.   MRN: [id number]  HPI  Here for follow up - chronic medical issues reviewed:  CLL - in remission - dx 2002 and tx 12/2001 with clinical trial at [facility] - follows with local onc every 4-6 months - poor "immune system response" due to same  Chronic allg rhinitis/sinusitus - freq flares and recurrent infections- reports compliance with ongoing medical treatment and no changes in medication dose or frequency. denies adverse side effects related to current therapy. follows with ENT and allergist for same  hx squam cell cancer, skin - 4 areas excised positive for cancer - sees derm 3-4x/year - no current skin changes   Dyslipidemia - dx 04/2011 but has declined rx meds - takes omega 3, works on low fat diet and exercise as tolerated - off lipitor 12/2011 due to increase LFTs - planning to resume 30d trial 04/2012 and monitor LFTs  Past Medical History  Diagnosis Date  . Anemia   . CARCINOMA, SKIN, SQUAMOUS CELL   . Allergic rhinitis, cause unspecified   . Chronic lymphoid leukemia in remission 2002 dx, tx 2003  . Dyslipidemia   . Hypogammaglobulinemia, acquired 11/18/2011    Due to CLL and prior chemo  . Hyperlipidemia   . Hepatitis A antibody positive 04/06/2012    Acute diarrheal illness & elevated liver function tests 02/18/12;     Review of Systems  Constitutional: Negative for fever.  no unexpected weight change Respiratory: Negative for shortness of breath. positive for cough, sinus congestion and green nasal discharge  Cardiovascular: No chest pain or palpitations      Objective:   Physical Exam  BP 118/72  Pulse 87  Temp 97.9 F (36.6 C) (Oral)  Resp 15  Wt 219 lb (99.338 kg)  SpO2 96% Wt Readings from Last 3 Encounters:  05/13/12 219 lb (99.338 kg)  04/12/12 216 lb (97.977 kg)  04/06/12 215 lb 1.6 oz (97.569 kg)    Constitutional: She is overweight;  well-developed and  well-nourished. No distress.  HENT: Head: Normocephalic and atraumatic. Nose: Nose mild discolored nasal thin discharge  with swollen turbinates. Mouth/Throat: Oropharynx is clear and moist, moderate erythema. No oropharyngeal exudate. Ears: B TMs normal Eyes: Conjunctivae and EOM are normal. Pupils are equal, round, and reactive to light. No scleral icterus.  Neck: Normal range of motion. Neck supple. No JVD present. No thyromegaly present.  Cardiovascular: Normal rate, regular rhythm and normal heart sounds.  No murmur heard. Pulmonary/Chest: Effort normal and breath sounds with scattered rhonchi. No respiratory distress. She has no wheezes. Psychiatric: She has a normal mood and affect. Her behavior is normal. Judgment and thought content normal.   Lab Results  Component Value Date   WBC 7.9 05/11/2012   HGB 14.5 05/11/2012   HCT 43.5 05/11/2012   PLT 142.0* 05/11/2012   CHOL 263* 11/12/2011   TRIG 144.0 11/12/2011   HDL 68.70 11/12/2011   LDLDIRECT 176.8 11/12/2011   ALT 41* 05/11/2012   AST 38* 05/11/2012   NA 133* 05/11/2012   K 3.8 05/11/2012   CL 101 05/11/2012   CREATININE 1.1 05/11/2012   BUN 18 05/11/2012   CO2 22 05/11/2012   TSH 4.61 05/11/2012        Assessment & Plan:   See problem list. Medications and labs reviewed today.

## 2012-05-13 NOTE — Assessment & Plan Note (Signed)
Recurrent infections - uses doxy or Avelox as needed per pulm Also manage allergy symptoms as ongoing Interval history reviewed - The current medical regimen is effective;  continue present plan and medications. 

## 2012-05-13 NOTE — Assessment & Plan Note (Signed)
IVIG transfusions ongoing - interval history reviewed follow up heme onc as ongoing

## 2012-05-13 NOTE — Assessment & Plan Note (Signed)
rising trend without meds, but unable to loss weight due to recurrent illness in past 6 months started lipitor 20mg  qhs 10/2011, off 12/2011 due to increase LFTs Restart 30d trial now 04/2012 and recheck LFTs in 30d

## 2012-05-13 NOTE — Patient Instructions (Signed)
It was good to see you today. Avelox antibiotics for sinus symptoms as discussed  -one daily for one week  - Your prescription(s) have been submitted to your pharmacy. Please take as directed and contact our office if you believe you are having problem(s) with the medication(s). Lipitor as per Dr. Christella Hartigan depending on your liver test Return in 6 months for recheck to monitor cholesterol and liver tests on Lipitor

## 2012-05-17 ENCOUNTER — Other Ambulatory Visit: Payer: Self-pay | Admitting: Oncology

## 2012-05-17 DIAGNOSIS — Z1231 Encounter for screening mammogram for malignant neoplasm of breast: Secondary | ICD-10-CM

## 2012-05-17 DIAGNOSIS — C9590 Leukemia, unspecified not having achieved remission: Secondary | ICD-10-CM

## 2012-05-18 ENCOUNTER — Telehealth: Payer: Self-pay | Admitting: Oncology

## 2012-05-18 ENCOUNTER — Other Ambulatory Visit: Payer: Self-pay | Admitting: Oncology

## 2012-05-18 DIAGNOSIS — D801 Nonfamilial hypogammaglobulinemia: Secondary | ICD-10-CM

## 2012-05-18 DIAGNOSIS — C9111 Chronic lymphocytic leukemia of B-cell type in remission: Secondary | ICD-10-CM

## 2012-05-18 NOTE — Telephone Encounter (Signed)
s.w. pt and advised on lab/infusion appt on 10.15.14....sed

## 2012-06-08 ENCOUNTER — Other Ambulatory Visit (HOSPITAL_BASED_OUTPATIENT_CLINIC_OR_DEPARTMENT_OTHER): Payer: 59 | Admitting: Lab

## 2012-06-08 ENCOUNTER — Ambulatory Visit (HOSPITAL_BASED_OUTPATIENT_CLINIC_OR_DEPARTMENT_OTHER): Payer: 59

## 2012-06-08 VITALS — BP 133/75 | HR 82 | Temp 97.6°F | Resp 20

## 2012-06-08 DIAGNOSIS — D839 Common variable immunodeficiency, unspecified: Secondary | ICD-10-CM

## 2012-06-08 DIAGNOSIS — C9111 Chronic lymphocytic leukemia of B-cell type in remission: Secondary | ICD-10-CM

## 2012-06-08 DIAGNOSIS — D801 Nonfamilial hypogammaglobulinemia: Secondary | ICD-10-CM

## 2012-06-08 LAB — COMPREHENSIVE METABOLIC PANEL (CC13)
AST: 32 U/L (ref 5–34)
Albumin: 4 g/dL (ref 3.5–5.0)
Alkaline Phosphatase: 65 U/L (ref 40–150)
Chloride: 110 mEq/L — ABNORMAL HIGH (ref 98–107)
Glucose: 95 mg/dl (ref 70–99)
Potassium: 4.3 mEq/L (ref 3.5–5.1)
Sodium: 143 mEq/L (ref 136–145)
Total Protein: 6.8 g/dL (ref 6.4–8.3)

## 2012-06-08 LAB — CBC WITH DIFFERENTIAL/PLATELET
BASO%: 0.2 % (ref 0.0–2.0)
EOS%: 1.5 % (ref 0.0–7.0)
HCT: 41.6 % (ref 34.8–46.6)
LYMPH%: 40.5 % (ref 14.0–49.7)
MCH: 29.9 pg (ref 25.1–34.0)
MCHC: 33.7 g/dL (ref 31.5–36.0)
MONO%: 10.2 % (ref 0.0–14.0)
NEUT%: 47.6 % (ref 38.4–76.8)
Platelets: 126 10*3/uL — ABNORMAL LOW (ref 145–400)

## 2012-06-08 MED ORDER — METHYLPREDNISOLONE SODIUM SUCC 40 MG IJ SOLR
40.0000 mg | Freq: Once | INTRAMUSCULAR | Status: AC
  Administered 2012-06-08: 40 mg via INTRAVENOUS

## 2012-06-08 MED ORDER — IMMUNE GLOBULIN (HUMAN) 20 GM/200ML IV SOLN
40.0000 g | Freq: Once | INTRAVENOUS | Status: AC
  Administered 2012-06-08: 40 g via INTRAVENOUS
  Filled 2012-06-08: qty 400

## 2012-06-08 MED ORDER — DIPHENHYDRAMINE HCL 25 MG PO CAPS
50.0000 mg | ORAL_CAPSULE | Freq: Four times a day (QID) | ORAL | Status: DC | PRN
  Administered 2012-06-08: 50 mg via ORAL

## 2012-06-08 MED ORDER — ACETAMINOPHEN 325 MG PO TABS
650.0000 mg | ORAL_TABLET | Freq: Once | ORAL | Status: AC
  Administered 2012-06-08: 650 mg via ORAL

## 2012-06-08 NOTE — Progress Notes (Signed)
Patient infusion started at 1050 at 0.59mL/kg/hr= 30 mL/hr X 15 mL. Increased rate to 0.6 mL/kg/hr= 60 mL/hr X 30 mL.  Vital signs stable.  No complaints from patient. Increased rate to 1.2 mL/kg/hr= 119 mL/hr X 60 mL.  Vital signs stable.  No complaints at this time.  Patient request that remainder of infusion be run at 119 mL/hr due to issues with previous infusions.

## 2012-06-10 ENCOUNTER — Telehealth: Payer: Self-pay | Admitting: *Deleted

## 2012-06-10 NOTE — Telephone Encounter (Signed)
Called and spoke with pt; per Dr. Cyndie Chime liver test are back to normal; CBC about the same and platelets slightly lower; will continue to monitor.  Pt verbalized understanding and confirmed appt for 07/06/12 with Lonna Cobb, NP.

## 2012-06-10 NOTE — Telephone Encounter (Signed)
Message copied by Gala Romney on Thu Jun 10, 2012  1:52 PM ------      Message from: Levert Feinstein      Created: Thu Jun 10, 2012 12:31 PM       Call pt:  Liver tests now back to normal;  CBC about the same - platelets slightly lower at 126,000 compared with 142,000 in September, 133,000 in July - will continue to monitor

## 2012-06-16 ENCOUNTER — Ambulatory Visit
Admission: RE | Admit: 2012-06-16 | Discharge: 2012-06-16 | Disposition: A | Discharge status: Home / Self Care | Payer: 59 | Source: Ambulatory Visit | Attending: Oncology | Admitting: Oncology

## 2012-06-16 DIAGNOSIS — C9590 Leukemia, unspecified not having achieved remission: Secondary | ICD-10-CM

## 2012-06-16 DIAGNOSIS — Z1231 Encounter for screening mammogram for malignant neoplasm of breast: Secondary | ICD-10-CM

## 2012-06-30 ENCOUNTER — Encounter: Payer: Self-pay | Admitting: Internal Medicine

## 2012-06-30 ENCOUNTER — Ambulatory Visit (INDEPENDENT_AMBULATORY_CARE_PROVIDER_SITE_OTHER): Payer: 59 | Admitting: Internal Medicine

## 2012-06-30 ENCOUNTER — Ambulatory Visit: Payer: 59 | Admitting: Internal Medicine

## 2012-06-30 VITALS — BP 120/80 | HR 82 | Temp 97.5°F | Ht 64.0 in

## 2012-06-30 DIAGNOSIS — D839 Common variable immunodeficiency, unspecified: Secondary | ICD-10-CM

## 2012-06-30 DIAGNOSIS — J40 Bronchitis, not specified as acute or chronic: Secondary | ICD-10-CM

## 2012-06-30 DIAGNOSIS — D801 Nonfamilial hypogammaglobulinemia: Secondary | ICD-10-CM

## 2012-06-30 DIAGNOSIS — Z23 Encounter for immunization: Secondary | ICD-10-CM

## 2012-06-30 DIAGNOSIS — R05 Cough: Secondary | ICD-10-CM

## 2012-06-30 MED ORDER — PREDNISONE (PAK) 10 MG PO TABS: 10.0000 mg | ORAL_TABLET | ORAL | Status: DC

## 2012-06-30 NOTE — Progress Notes (Signed)
Subjective:    Patient ID: Bianca Parker, female    DOB: [birth year], 66 y.o.   MRN: [id number]  Cough This is a new problem. The current episode started in the past 7 days. The problem has been gradually worsening. The problem occurs every few hours. The cough is non-productive. Associated symptoms include myalgias. Pertinent negatives include no chest pain, ear congestion, ear pain, fever, headaches, nasal congestion, postnasal drip, rhinorrhea, sore throat, shortness of breath, weight loss or wheezing. The symptoms are aggravated by lying down. She has tried OTC cough suppressant for the symptoms. The treatment provided mild relief. Her past medical history is significant for environmental allergies. There is no history of bronchiectasis, COPD or emphysema.   also reviewed chronic medical issues:  CLL - in remission - dx 2002 and tx 12/2001 with clinical trial at [facility] - follows with local onc every 4-6 months - poor "immune system response" due to same, ongoing IVIG for treatment if required of acquired hypogammaglobulinemia  Chronic allg rhinitis/sinusitus - freq flares and recurrent infections- reports compliance with ongoing medical treatment and no changes in medication dose or frequency. denies adverse side effects related to current therapy. follows with ENT and allergist for same  hx squam cell cancer, skin - 4 areas excised positive for cancer - sees derm 3-4x/year - no current skin changes   Dyslipidemia - dx 04/2011 but has declined rx meds - takes omega 3, works on low fat diet and exercise as tolerated - off lipitor 12/2011 due to increase LFTs - planning to resume 30d trial 04/2012 and monitor LFTs  Past Medical History  Diagnosis Date  . Anemia   . CARCINOMA, SKIN, SQUAMOUS CELL   . Allergic rhinitis, cause unspecified   . Chronic lymphoid leukemia in remission 2002 dx, tx 2003  . Dyslipidemia   . Hypogammaglobulinemia, acquired 11/18/2011    Due to CLL and prior chemo    . Hyperlipidemia   . Hepatitis A antibody positive 04/06/2012    Acute diarrheal illness & elevated liver function tests 02/18/12;     Review of Systems  Constitutional: Negative for fever and weight loss.  HENT: Negative for ear pain, sore throat, rhinorrhea and postnasal drip.   Respiratory: Positive for cough. Negative for shortness of breath and wheezing.   Cardiovascular: Negative for chest pain.  Musculoskeletal: Positive for myalgias.  Neurological: Negative for headaches.  Hematological: Positive for environmental allergies.        Objective:   Physical Exam  BP 120/80  Pulse 82  Temp 97.5 F (36.4 C) (Oral)  Ht 5\' 4"  (1.626 m)  SpO2 95% Wt Readings from Last 3 Encounters:  05/13/12 219 lb (99.338 kg)  04/12/12 216 lb (97.977 kg)  04/06/12 215 lb 1.6 oz (97.569 kg)   Constitutional: She is overweight;  well-developed and well-nourished. No distress.  HENT: Head: Normocephalic and atraumatic. Nose: Nose no discharge or swollen turbinates. Mouth/Throat: Oropharynx is clear and moist, min erythema. No oropharyngeal exudate or PND. Ears: B TMs normal Eyes: Conjunctivae and EOM are normal. Pupils are equal, round, and reactive to light. No scleral icterus.  Neck: Normal range of motion. Neck supple. No JVD present. No thyromegaly present.  Cardiovascular: Normal rate, regular rhythm and normal heart sounds.  No murmur heard. Pulmonary/Chest: Effort normal and breath sounds with out rhonchi. No respiratory distress. She has no wheezes. Psychiatric: She has a normal mood and affect. Her behavior is normal. Judgment and thought content normal.   Lab Results  Component Value Date   WBC 4.1 06/08/2012   HGB 14.0 06/08/2012   HCT 41.6 06/08/2012   PLT 126* 06/08/2012   CHOL 263* 11/12/2011   TRIG 144.0 11/12/2011   HDL 68.70 11/12/2011   LDLDIRECT 176.8 11/12/2011   ALT 42 06/08/2012   AST 32 06/08/2012   NA 143 06/08/2012   K 4.3 06/08/2012   CL 110* 06/08/2012    CREATININE 1.0 06/08/2012   BUN 15.0 06/08/2012   CO2 22 06/08/2012   TSH 4.61 05/11/2012        Assessment & Plan:   Cough - suspect allergic inflammatory with bronchospasm - no evidence for bacterial infection on hx or exam - tx with 6d pred pak and pt will call if symptoms worse or unimproved    Also see problem list. Medications and labs reviewed today.

## 2012-06-30 NOTE — Assessment & Plan Note (Signed)
IVIG transfusion periodically ongoing  follow up heme onc as ongoing -interval history reviewed  

## 2012-06-30 NOTE — Patient Instructions (Signed)
It was good to see you today. If you develop worsening symptoms or fever, call and we can reconsider antibiotics, but it does not appear necessary to use antibiotics at this time. Will use prednisone taper over next 6 days for inflammation - Your prescription(s) have been submitted to your pharmacy. Please take as directed and contact our office if you believe you are having problem(s) with the medication(s).

## 2012-07-06 ENCOUNTER — Other Ambulatory Visit (HOSPITAL_BASED_OUTPATIENT_CLINIC_OR_DEPARTMENT_OTHER): Payer: 59 | Admitting: Lab

## 2012-07-06 ENCOUNTER — Telehealth: Payer: Self-pay | Admitting: Oncology

## 2012-07-06 ENCOUNTER — Other Ambulatory Visit: Payer: Self-pay | Admitting: Nurse Practitioner

## 2012-07-06 ENCOUNTER — Ambulatory Visit (HOSPITAL_BASED_OUTPATIENT_CLINIC_OR_DEPARTMENT_OTHER): Payer: 59 | Admitting: Nurse Practitioner

## 2012-07-06 ENCOUNTER — Ambulatory Visit (HOSPITAL_BASED_OUTPATIENT_CLINIC_OR_DEPARTMENT_OTHER): Payer: 59

## 2012-07-06 VITALS — BP 146/75 | HR 75 | Temp 98.8°F | Resp 18

## 2012-07-06 VITALS — BP 159/93 | HR 76 | Temp 97.2°F | Resp 20 | Ht 64.0 in | Wt 218.7 lb

## 2012-07-06 DIAGNOSIS — D839 Common variable immunodeficiency, unspecified: Secondary | ICD-10-CM

## 2012-07-06 DIAGNOSIS — D801 Nonfamilial hypogammaglobulinemia: Secondary | ICD-10-CM

## 2012-07-06 DIAGNOSIS — C9111 Chronic lymphocytic leukemia of B-cell type in remission: Secondary | ICD-10-CM

## 2012-07-06 DIAGNOSIS — R768 Other specified abnormal immunological findings in serum: Secondary | ICD-10-CM

## 2012-07-06 LAB — COMPREHENSIVE METABOLIC PANEL (CC13)
Albumin: 4.1 g/dL (ref 3.5–5.0)
Alkaline Phosphatase: 58 U/L (ref 40–150)
CO2: 24 mEq/L (ref 22–29)
Calcium: 9.3 mg/dL (ref 8.4–10.4)
Chloride: 109 mEq/L — ABNORMAL HIGH (ref 98–107)
Glucose: 89 mg/dl (ref 70–99)
Potassium: 3.8 mEq/L (ref 3.5–5.1)
Sodium: 143 mEq/L (ref 136–145)
Total Protein: 7 g/dL (ref 6.4–8.3)

## 2012-07-06 LAB — IGG, IGA, IGM
IgA: <6 mg/dL — ABNORMAL LOW (ref 69–380)
IgG (Immunoglobin G), Serum: 1090 mg/dL (ref 690–1700)
IgM, Serum: 5 mg/dL — ABNORMAL LOW (ref 52–322)

## 2012-07-06 LAB — CBC WITH DIFFERENTIAL/PLATELET
BASO%: 0 % (ref 0.0–2.0)
HCT: 42.8 % (ref 34.8–46.6)
MCHC: 33.2 g/dL (ref 31.5–36.0)
MONO#: 0.6 10*3/uL (ref 0.1–0.9)
NEUT%: 54.9 % (ref 38.4–76.8)
RBC: 4.75 10*6/uL (ref 3.70–5.45)
WBC: 5.5 10*3/uL (ref 3.9–10.3)
lymph#: 1.8 10*3/uL (ref 0.9–3.3)
nRBC: 0 % (ref 0–0)

## 2012-07-06 MED ORDER — ACETAMINOPHEN 325 MG PO TABS
650.0000 mg | ORAL_TABLET | Freq: Once | ORAL | Status: AC
  Administered 2012-07-06: 650 mg via ORAL

## 2012-07-06 MED ORDER — METHYLPREDNISOLONE SODIUM SUCC 40 MG IJ SOLR
40.0000 mg | Freq: Once | INTRAMUSCULAR | Status: AC
  Administered 2012-07-06: 40 mg via INTRAVENOUS

## 2012-07-06 MED ORDER — IMMUNE GLOBULIN (HUMAN) 20 GM/200ML IV SOLN
40.0000 g | Freq: Once | INTRAVENOUS | Status: AC
  Administered 2012-07-06: 40 g via INTRAVENOUS
  Filled 2012-07-06: qty 400

## 2012-07-06 MED ORDER — DIPHENHYDRAMINE HCL 25 MG PO CAPS
50.0000 mg | ORAL_CAPSULE | Freq: Four times a day (QID) | ORAL | Status: DC | PRN
  Administered 2012-07-06: 50 mg via ORAL

## 2012-07-06 NOTE — Progress Notes (Signed)
OFFICE PROGRESS NOTE  Interval history:  Ms. Bianca Parker is a 66 year old woman diagnosed with RAI stage III CLL in July 2002. She was treated on protocol at [facility] with FCR. She achieved a complete response that has been durable. She developed hypogammaglobulinemia and as a result gets recurrent bronchitis and sinusitis. She was started on a trial of IVIG on 11/25/2011. She is seen today for scheduled followup.  Ms. Bianca Parker reports a nonproductive cough over the past several weeks. No fever or shortness of breath. She reports the etiology of the cough was felt to be viral. She recently completed a Medrol Dosepak. Her husband has had similar symptoms.  She otherwise feels well. No fevers or sweats. She has a good appetite. No nausea or vomiting. She continues Imodium for intermittent diarrhea.   Objective: Blood pressure 159/93, pulse 76, temperature 97.2 F (36.2 C), temperature source Oral, resp. rate 20, height 5\' 4"  (1.626 m), weight 218 lb 11.2 oz (99.202 kg).  Oropharynx is without thrush or ulceration. No palpable cervical, supra-clavicular, axillary or inguinal lymph nodes. Lungs are clear. No wheezes or rales. Regular cardiac rhythm. Abdomen is soft and nontender. No organomegaly. Extremities are without edema. Calves are soft and nontender.  Lab Results: Lab Results  Component Value Date   WBC 5.5 07/06/2012   HGB 14.2 07/06/2012   HCT 42.8 07/06/2012   MCV 90.1 07/06/2012   PLT 133* 07/06/2012    Chemistry:    Chemistry      Component Value Date/Time   NA 143 06/08/2012 0937   NA 133* 05/11/2012 0833   K 4.3 06/08/2012 0937   K 3.8 05/11/2012 0833   CL 110* 06/08/2012 0937   CL 101 05/11/2012 0833   CO2 22 06/08/2012 0937   CO2 22 05/11/2012 0833   BUN 15.0 06/08/2012 0937   BUN 18 05/11/2012 0833   CREATININE 1.0 06/08/2012 0937   CREATININE 1.1 05/11/2012 0833      Component Value Date/Time   CALCIUM 9.8 06/08/2012 0937   CALCIUM 9.0 05/11/2012 0833   ALKPHOS 65  06/08/2012 0937   ALKPHOS 49 05/11/2012 0837   AST 32 06/08/2012 0937   AST 38* 05/11/2012 0837   ALT 42 06/08/2012 0937   ALT 41* 05/11/2012 0837   BILITOT 0.60 06/08/2012 0937   BILITOT 0.9 05/11/2012 0837       Studies/Results: Mm Digital Screening  06/18/2012  *RADIOLOGY REPORT*  Clinical Data: Screening.  DIGITAL BILATERAL SCREENING MAMMOGRAM WITH CAD DIGITAL BREAST TOMOSYNTHESIS  Digital breast tomosynthesis images are acquired in two projections.  These images are reviewed in combination with the digital mammogram, confirming the findings below.  Comparison:  Previous exams.  Findings:  There are scattered fibroglandular densities. No suspicious masses, architectural distortion, or calcifications are present.  Images were processed with CAD.  IMPRESSION: No mammographic evidence of malignancy.  A result letter of this screening mammogram will be mailed directly to the patient.  RECOMMENDATION: Screening mammogram in one year. (Code:SM-B-01Y)  BI-RADS CATEGORY 1:  Negative.   Original Report Authenticated By: Sherian Rein, M.D.     Medications: I have reviewed the patient's current medications.  Assessment/Plan:  1. CLL. She remains in hematologic remission. 2. Hypogammaglobulinemia with recurrent sinopulmonary infections. She began monthly IVIG in April 2013 with a significant decrease in infections. The plan is to continue monthly IVIG for a total of one year. 3. Intermittent diarrhea. Status post negative upper and lower endoscopy. She continues Imodium. She is followed by Dr. Christella Hartigan.  4. Elevation of transaminases on 01/06/2012. MRI on 01/28/2012 with mild hepatic steatosis. Most recent chemistry panel on 06/08/2012 showed normalization of transaminases. 5. Recurrent squamous cell carcinomas of the skin. She is status post recent dermatology evaluation with no concerning lesions identified. 6. History of herpes zoster infection. 7. History of herpes simplex virus  2.  Disposition-Ms. Bianca Parker appears stable. Plan to continue monthly IVIG for a total of one year. She will return for a followup visit in 3 months. She will contact the office in the interim with any problems.  Plan reviewed with Dr. Cyndie Chime.  Bianca Parker ANP/GNP-BC

## 2012-07-06 NOTE — Patient Instructions (Addendum)

## 2012-07-06 NOTE — Telephone Encounter (Signed)
Gave patient appt for lab and IVIG from December 2013 ,[facility] and up to April 2014 lab

## 2012-07-06 NOTE — Progress Notes (Signed)
11:35 am;  Privigen started at 0.69ml/kg/hr (29 ml/hr) x 30 min.  12:05 pm;  NO s/s of reaction and rate increased to 0.46ml/kg/hr (23ml/hr) x 30 min. 12:35 pm;  No s/s of reaction.  Rate increased to max rate of 1.85ml/kg/hr (159ml/hr). 1:05pm;   No s/s of reaction.  Rate maintained at 119 ml/hr.     3:40 pm;   Infusion completed and pt c/o feeling a little flushed and a little different.  Afebrile w/ BP slightly elevated.  3:42 pm;  BP continues a little elevated.  Pt denies any itching or resp distress.   4:00 pm;  BP back to what it has been all day.  Afebrile.  Pt states the warmness and flushing have subsided.  States feeling wnl now.   Instructed pt to go to ED if symptoms of any sob.  Call for any other symptoms, changes. She verbalized understanding.

## 2012-07-07 ENCOUNTER — Ambulatory Visit (INDEPENDENT_AMBULATORY_CARE_PROVIDER_SITE_OTHER): Payer: 59 | Admitting: Internal Medicine

## 2012-07-07 ENCOUNTER — Encounter: Payer: Self-pay | Admitting: Internal Medicine

## 2012-07-07 VITALS — BP 140/86 | HR 76 | Ht 64.5 in | Wt 221.2 lb

## 2012-07-07 DIAGNOSIS — J209 Acute bronchitis, unspecified: Secondary | ICD-10-CM

## 2012-07-07 MED ORDER — AZITHROMYCIN 250 MG PO TABS: ORAL_TABLET | ORAL | Status: DC

## 2012-07-07 NOTE — Patient Instructions (Addendum)
Script for Zpak sent  Please call as needed

## 2012-07-07 NOTE — Progress Notes (Signed)
Subjective:    Patient ID: Bianca Parker, female    DOB: Nov 27, 1945, 66 y.o.   MRN: [id number]  HPI 5/2HPI  18 yoF followed for Allergic rhinitis, chronic rhinosinusitis. Hx chemoRx for CLL in remission.  Last here- March 9. She responded to antibiotic, but then got another cold clearing more quickly. Saw Dr Ezzard Standing- recognized "congestion" left nostril.  Denies infection now. Catches colds very quickly from her grandchild.  Coming today off antihistamines for allergy testing.  Demonstrated immunoglobulin deficiency as of 6/ 2011, residual from her chemotherapy of 8-9 years ago, reviewed.  She has encasings and HEPA filter.  Allergy profile- 11/01/10- IgE < 1.5; no specific elevations. Also low IgG for penicillin  Skin testing- weak positive intradermal reactions to common inhalants.   02/24/11- 65 yoF followed for Allergic rhinitis, chronic rhinosinusitis. Hx chemoRx for CLL in remission Since last here she has done fairly well with fexofenadine. She has had one "sinus infection" since last here. We talked again about non-bacterial sinusitis. She emphases that she gets fever, bed-ridden malaise. Very concerned about her host resistance because of her leukemia hx. We discussed how to culture, risks of allergy and resistance with frequent antibiotics.  Today she feels well- 2 months without an infection. Realizes risks with exposure to her gransdson's virus colds.   07/01/11-  65 yoF followed for Allergic rhinitis, chronic rhinosinusitis. Hx chemoRx for CLL in remission/ Dr Cyndie Chime Has had flu vaccine. A cold  has spread to her family. She started herself with saline nasal rinse using boiled well water. Got Zicam lozenges and thinks she fought off the cold. Takes probiotics routinely. Her CLL may not be in remission now. Neutrophils and platelets are down. 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  07/07/12- 66 yoF followed for Allergic rhinitis, chronic rhinosinusitis.  Hx chemoRx for CLL in remission/ Dr Cyndie Chime, acquired hypogammaglobulinemia Recently having ? bronchitis flare-was put on Pred pak from PCP. No sinus congestion or sinus trouble. Somewhat better but not completly gone. She chronic bronchitis 3 weeks ago from her husband- prednisone taper from Dr.Leschber. Did shake off earlier cold. Was treated with Avelox in September. Now scant white phlegm or pale green. Some rattle. No fever or chills. Mucinex DM helps. Dr Cyndie Chime heme-onc treating acquired hypogammaglobulinemia with IVIG. Her CLL remains in remission.   Review of Systems- see HPI Constitutional:   No-   weight loss, night sweats, fevers, chills, fatigue, lassitude. HEENT:   No-  headaches, difficulty swallowing, tooth/dental problems, sore throat,       No-  sneezing, itching, ear ache, nasal congestion, post nasal drip,  CV:  No-   chest pain, orthopnea, PND, swelling in lower extremities, anasarca, dizziness, palpitations Resp: No-   shortness of breath with exertion or at rest.              +   productive cough,  + non-productive cough,  No- coughing up of blood.              + change in color of mucus.  No- wheezing.   Skin: No-   rash or lesions. GI:  No-   heartburn, indigestion, abdominal pain, nausea, vomiting,  GU: . MS:  No-   joint pain or swelling. Neuro-     nothing unusual Psych:  No- change in mood or affect. No depression or anxiety.  No memory loss.  Objective:   Physical Exam General- Alert, Oriented, Affect-appropriate, Distress- none acute; well appearing Skin-  rash-none, lesions- none, excoriation- none Lymphadenopathy- none Head- atraumatic            Eyes- Gross vision intact, PERRLA, conjunctivae clear secretions            Ears- Hearing, canals-normal            Nose- Clear, no-Septal dev, mucus, polyps, erosion, perforation             Throat- Mallampati II , mucosa clear , drainage- none, tonsils- atrophic Neck- flexible , trachea midline, no  stridor , thyroid nl, carotid no bruit Chest - symmetrical excursion , unlabored           Heart/CV- RRR , no murmur , no gallop  , no rub, nl s1 s2                           - JVD- none , edema- none, stasis changes- none, varices- none           Lung- clear to P&A, wheeze- none, cough- none , dullness-none, rub- none           Chest wall-  Abd-  Br/ Gen/ Rectal- Not done, not indicated Extrem- cyanosis- none, clubbing, none, atrophy- none, strength- nl Neuro- grossly intact to observation

## 2012-07-14 ENCOUNTER — Telehealth: Payer: Self-pay | Admitting: Internal Medicine

## 2012-07-14 MED ORDER — BENZONATATE 100 MG PO CAPS: 100.0000 mg | ORAL_CAPSULE | Freq: Four times a day (QID) | ORAL | Status: DC | PRN

## 2012-07-14 NOTE — Telephone Encounter (Signed)
Currently on Mucinex (night kind) at night and seems to help slightly but still having slight cough(clear when productive). Cough syrups make her feel weird- no Rx per patient.   Pt wanted to give update to CY and get other recs prior to [place]. Pt aware that CY is out of the office until Thursday 07-15-12.

## 2012-07-14 NOTE — Telephone Encounter (Signed)
Offer benzonatate perles 100 mg, # 30,  1 every 6 hours if needed for cough ref x 1

## 2012-07-14 NOTE — Telephone Encounter (Signed)
Spoke with patient-aware of rx sent and will call back if no better.

## 2012-07-17 DIAGNOSIS — J209 Acute bronchitis, unspecified: Secondary | ICD-10-CM | POA: Insufficient documentation

## 2012-07-17 NOTE — Assessment & Plan Note (Signed)
Plan-offer Z-Pak with discussion of management if this fails to clear

## 2012-08-03 ENCOUNTER — Ambulatory Visit (HOSPITAL_BASED_OUTPATIENT_CLINIC_OR_DEPARTMENT_OTHER): Payer: 59 | Admitting: Lab

## 2012-08-03 ENCOUNTER — Ambulatory Visit (HOSPITAL_BASED_OUTPATIENT_CLINIC_OR_DEPARTMENT_OTHER): Payer: 59

## 2012-08-03 VITALS — BP 141/84 | HR 89 | Temp 98.1°F | Resp 20 | Ht 64.0 in | Wt 221.2 lb

## 2012-08-03 DIAGNOSIS — D839 Common variable immunodeficiency, unspecified: Secondary | ICD-10-CM

## 2012-08-03 DIAGNOSIS — D801 Nonfamilial hypogammaglobulinemia: Secondary | ICD-10-CM

## 2012-08-03 DIAGNOSIS — C9111 Chronic lymphocytic leukemia of B-cell type in remission: Secondary | ICD-10-CM

## 2012-08-03 LAB — COMPREHENSIVE METABOLIC PANEL (CC13)
AST: 35 U/L — ABNORMAL HIGH (ref 5–34)
Albumin: 3.9 g/dL (ref 3.5–5.0)
Alkaline Phosphatase: 68 U/L (ref 40–150)
BUN: 13 mg/dL (ref 7.0–26.0)
Glucose: 97 mg/dl (ref 70–99)
Potassium: 4.2 mEq/L (ref 3.5–5.1)
Sodium: 143 mEq/L (ref 136–145)
Total Bilirubin: 0.64 mg/dL (ref 0.20–1.20)

## 2012-08-03 LAB — CBC WITH DIFFERENTIAL/PLATELET
BASO%: 0.2 % (ref 0.0–2.0)
EOS%: 1.5 % (ref 0.0–7.0)
MCH: 29.8 pg (ref 25.1–34.0)
MCHC: 33.3 g/dL (ref 31.5–36.0)
MCV: 89.3 fL (ref 79.5–101.0)
MONO%: 10.1 % (ref 0.0–14.0)
RDW: 13.7 % (ref 11.2–14.5)
lymph#: 1.8 10*3/uL (ref 0.9–3.3)

## 2012-08-03 MED ORDER — DIPHENHYDRAMINE HCL 25 MG PO CAPS
50.0000 mg | ORAL_CAPSULE | Freq: Four times a day (QID) | ORAL | Status: DC | PRN
  Administered 2012-08-03: 50 mg via ORAL

## 2012-08-03 MED ORDER — METHYLPREDNISOLONE SODIUM SUCC 40 MG IJ SOLR
40.0000 mg | Freq: Once | INTRAMUSCULAR | Status: AC
  Administered 2012-08-03: 40 mg via INTRAVENOUS

## 2012-08-03 MED ORDER — ACETAMINOPHEN 325 MG PO TABS
650.0000 mg | ORAL_TABLET | Freq: Once | ORAL | Status: AC
  Administered 2012-08-03: 650 mg via ORAL

## 2012-08-03 MED ORDER — IMMUNE GLOBULIN (HUMAN) 10 GM/100ML IV SOLN
400.0000 mg/kg | Freq: Once | INTRAVENOUS | Status: AC
  Administered 2012-08-03: 40 g via INTRAVENOUS
  Filled 2012-08-03: qty 400

## 2012-08-03 NOTE — Patient Instructions (Addendum)

## 2012-08-19 ENCOUNTER — Telehealth: Payer: Self-pay | Admitting: Internal Medicine

## 2012-08-19 MED ORDER — CLARITHROMYCIN 500 MG PO TABS: 500.0000 mg | ORAL_TABLET | Freq: Two times a day (BID) | ORAL | Status: DC

## 2012-08-19 NOTE — Telephone Encounter (Signed)
Per CY, Biaxin 500 #14   Take 1 BID  x0 refills. Sent to [facility].  Patient aware.

## 2012-08-19 NOTE — Telephone Encounter (Signed)
Patient states that she has been having sinus infection symptoms, facial pressure, green mucus x 3 days and is requesting an abx to be called into pharmacy.  [facility]  Dr Maple Hudson please advise. Thanks.

## 2012-08-31 ENCOUNTER — Ambulatory Visit (HOSPITAL_BASED_OUTPATIENT_CLINIC_OR_DEPARTMENT_OTHER): Payer: 59

## 2012-08-31 ENCOUNTER — Other Ambulatory Visit (HOSPITAL_BASED_OUTPATIENT_CLINIC_OR_DEPARTMENT_OTHER): Payer: 59 | Admitting: Lab

## 2012-08-31 VITALS — BP 145/87 | HR 98 | Temp 98.7°F | Resp 18

## 2012-08-31 DIAGNOSIS — D801 Nonfamilial hypogammaglobulinemia: Secondary | ICD-10-CM

## 2012-08-31 DIAGNOSIS — D839 Common variable immunodeficiency, unspecified: Secondary | ICD-10-CM

## 2012-08-31 DIAGNOSIS — C9111 Chronic lymphocytic leukemia of B-cell type in remission: Secondary | ICD-10-CM

## 2012-08-31 LAB — COMPREHENSIVE METABOLIC PANEL (CC13)
BUN: 14 mg/dL (ref 7.0–26.0)
CO2: 20 mEq/L — ABNORMAL LOW (ref 22–29)
Calcium: 9.8 mg/dL (ref 8.4–10.4)
Chloride: 113 mEq/L — ABNORMAL HIGH (ref 98–107)
Creatinine: 1.2 mg/dL — ABNORMAL HIGH (ref 0.6–1.1)
Glucose: 95 mg/dl (ref 70–99)
Total Bilirubin: 0.44 mg/dL (ref 0.20–1.20)

## 2012-08-31 LAB — CBC WITH DIFFERENTIAL/PLATELET
Basophils Absolute: 0 10*3/uL (ref 0.0–0.1)
Eosinophils Absolute: 0.1 10*3/uL (ref 0.0–0.5)
HCT: 42.6 % (ref 34.8–46.6)
HGB: 14.3 g/dL (ref 11.6–15.9)
LYMPH%: 42.8 % (ref 14.0–49.7)
MCV: 89.7 fL (ref 79.5–101.0)
MONO#: 0.5 10*3/uL (ref 0.1–0.9)
MONO%: 10.8 % (ref 0.0–14.0)
NEUT#: 2 10*3/uL (ref 1.5–6.5)
NEUT%: 44.6 % (ref 38.4–76.8)
Platelets: 134 10*3/uL — ABNORMAL LOW (ref 145–400)
RBC: 4.75 10*6/uL (ref 3.70–5.45)

## 2012-08-31 MED ORDER — METHYLPREDNISOLONE SODIUM SUCC 40 MG IJ SOLR
40.0000 mg | Freq: Once | INTRAMUSCULAR | Status: AC
  Administered 2012-08-31: 40 mg via INTRAVENOUS

## 2012-08-31 MED ORDER — DIPHENHYDRAMINE HCL 25 MG PO CAPS
50.0000 mg | ORAL_CAPSULE | Freq: Four times a day (QID) | ORAL | Status: DC | PRN
  Administered 2012-08-31: 50 mg via ORAL

## 2012-08-31 MED ORDER — IMMUNE GLOBULIN (HUMAN) 20 GM/200ML IV SOLN
40.0000 g | Freq: Once | INTRAVENOUS | Status: AC
  Administered 2012-08-31: 40 g via INTRAVENOUS
  Filled 2012-08-31: qty 400

## 2012-08-31 MED ORDER — ACETAMINOPHEN 325 MG PO TABS
650.0000 mg | ORAL_TABLET | Freq: Once | ORAL | Status: AC
  Administered 2012-08-31: 650 mg via ORAL

## 2012-08-31 NOTE — Patient Instructions (Signed)

## 2012-08-31 NOTE — Progress Notes (Signed)
1040  Privigen started at 0.47ml/kg/hr = 30 ml/hr x 15 mL. Patient tolerated well. No s/sx of reaction  1110  0.6 ml/kg/hr= 60 ml/hr x 30 ml. Patient tolerated well. No s/sx of reaction.  1140 1.2 ml/kg/hr = 120 ml/hr x 60 ml.  Patient tolerated well.  1505 patient complains of a flushing feeling and headache blood pressure 148/96, pulse 93, Temperature 98.3  1530 Patient still complains of headache and flushing. Blood pressure 147/93, pulse 99, Temperature 98.4. Lonna Cobb, NP notified. Nursing instructed to observe patient for 15 minutes.   1540 Patient still complaining of headache and flushing. Blood pressure 145/89, pulse 100, Temperature 98.7  1550 Patient states she feels better. Blood pressure is 145/87, pulse 98 and Temperature 98.7

## 2012-09-17 ENCOUNTER — Telehealth: Payer: Self-pay | Admitting: Internal Medicine

## 2012-09-17 MED ORDER — CLARITHROMYCIN 500 MG PO TABS: 500.0000 mg | ORAL_TABLET | Freq: Two times a day (BID) | ORAL | Status: DC

## 2012-09-17 NOTE — Telephone Encounter (Signed)
Per CY-okay tog ive Biaxin 500 mg #14 take 1 po bid no refills.

## 2012-09-17 NOTE — Telephone Encounter (Signed)
ATC patient, no answer [id number] 

## 2012-09-17 NOTE — Telephone Encounter (Signed)
Pt aware of CDY recs. She voiced her understanding and rx has been sent to the pharmacy. Nothing further was needed

## 2012-09-17 NOTE — Telephone Encounter (Signed)
i spoke with pt. C/o cough w/ occasional green phlem, nasal congestion, PND, facial pressure x last night. No f/c/s/n/v/wheesing/chest tx. She has been taking mucinex. She is requesting ABX. Please advise Dr. Maple Hudson thanks Last OV 07/07/12 Pending OV 07/06/13 Allergies  Allergen Reactions  . Penicillins   . Pentamidine   . Sulfonamide Derivatives

## 2012-09-17 NOTE — Telephone Encounter (Signed)
Pt returned triage's call.  Accidentally hit decline call instead of accept call.  Bianca Parker

## 2012-09-28 ENCOUNTER — Ambulatory Visit (HOSPITAL_BASED_OUTPATIENT_CLINIC_OR_DEPARTMENT_OTHER): Payer: 59

## 2012-09-28 ENCOUNTER — Telehealth: Payer: Self-pay | Admitting: Oncology

## 2012-09-28 ENCOUNTER — Ambulatory Visit (HOSPITAL_BASED_OUTPATIENT_CLINIC_OR_DEPARTMENT_OTHER): Payer: 59 | Admitting: Oncology

## 2012-09-28 ENCOUNTER — Other Ambulatory Visit (HOSPITAL_BASED_OUTPATIENT_CLINIC_OR_DEPARTMENT_OTHER): Payer: 59 | Admitting: Lab

## 2012-09-28 VITALS — BP 140/79 | HR 86 | Temp 97.1°F | Resp 18

## 2012-09-28 VITALS — BP 149/89 | HR 96 | Temp 97.7°F | Resp 20 | Ht 64.0 in | Wt 226.6 lb

## 2012-09-28 DIAGNOSIS — C9111 Chronic lymphocytic leukemia of B-cell type in remission: Secondary | ICD-10-CM

## 2012-09-28 DIAGNOSIS — D839 Common variable immunodeficiency, unspecified: Secondary | ICD-10-CM

## 2012-09-28 DIAGNOSIS — C4492 Squamous cell carcinoma of skin, unspecified: Secondary | ICD-10-CM

## 2012-09-28 DIAGNOSIS — D801 Nonfamilial hypogammaglobulinemia: Secondary | ICD-10-CM

## 2012-09-28 LAB — COMPREHENSIVE METABOLIC PANEL (CC13)
ALT: 49 U/L (ref 0–55)
BUN: 13.3 mg/dL (ref 7.0–26.0)
CO2: 23 mEq/L (ref 22–29)
Calcium: 9.6 mg/dL (ref 8.4–10.4)
Chloride: 108 mEq/L — ABNORMAL HIGH (ref 98–107)
Creatinine: 1.1 mg/dL (ref 0.6–1.1)
Total Bilirubin: 0.41 mg/dL (ref 0.20–1.20)

## 2012-09-28 LAB — CBC WITH DIFFERENTIAL/PLATELET
BASO%: 0.2 % (ref 0.0–2.0)
Eosinophils Absolute: 0.1 10*3/uL (ref 0.0–0.5)
HCT: 42.3 % (ref 34.8–46.6)
LYMPH%: 36 % (ref 14.0–49.7)
MCHC: 33.3 g/dL (ref 31.5–36.0)
MONO#: 0.3 10*3/uL (ref 0.1–0.9)
NEUT#: 2.4 10*3/uL (ref 1.5–6.5)
NEUT%: 54.7 % (ref 38.4–76.8)
Platelets: 143 10*3/uL — ABNORMAL LOW (ref 145–400)
WBC: 4.3 10*3/uL (ref 3.9–10.3)
lymph#: 1.6 10*3/uL (ref 0.9–3.3)

## 2012-09-28 MED ORDER — ACETAMINOPHEN 325 MG PO TABS
650.0000 mg | ORAL_TABLET | Freq: Once | ORAL | Status: AC
  Administered 2012-09-28: 650 mg via ORAL

## 2012-09-28 MED ORDER — IMMUNE GLOBULIN (HUMAN) 20 GM/200ML IV SOLN
40.0000 g | Freq: Once | INTRAVENOUS | Status: AC
  Administered 2012-09-28: 40 g via INTRAVENOUS
  Filled 2012-09-28: qty 400

## 2012-09-28 MED ORDER — HEPARIN SOD (PORK) LOCK FLUSH 100 UNIT/ML IV SOLN
500.0000 [IU] | Freq: Once | INTRAVENOUS | Status: AC | PRN
  Filled 2012-09-28: qty 5

## 2012-09-28 MED ORDER — METHYLPREDNISOLONE SODIUM SUCC 40 MG IJ SOLR
40.0000 mg | Freq: Once | INTRAMUSCULAR | Status: AC
  Administered 2012-09-28: 40 mg via INTRAVENOUS

## 2012-09-28 MED ORDER — DIPHENHYDRAMINE HCL 25 MG PO CAPS
50.0000 mg | ORAL_CAPSULE | Freq: Four times a day (QID) | ORAL | Status: DC | PRN
  Administered 2012-09-28: 50 mg via ORAL

## 2012-09-28 NOTE — Patient Instructions (Addendum)

## 2012-09-28 NOTE — Telephone Encounter (Signed)
Talked to pt and gave her appt for all IVIG appt

## 2012-09-28 NOTE — Patient Instructions (Signed)
IVIG on 3/4 and 11/23/12 then we will take a break Lab on days of IVIG then lab on 6/3 Visit with NP on 6/10

## 2012-09-29 ENCOUNTER — Telehealth: Payer: Self-pay | Admitting: *Deleted

## 2012-09-29 NOTE — Telephone Encounter (Signed)
Per staff message and POF I have scheduled appts.  JMW  

## 2012-09-29 NOTE — Progress Notes (Signed)
Hematology and Oncology Follow Up Visit  Bianca Parker [id number] 01/20/1946 67 y.o. 09/29/2012 12:11 PM   Principle Diagnosis: Encounter Diagnoses  Name Primary?  . Chronic lymphoid leukemia in remission Yes  . Hypogammaglobulinemia, acquired   . LFT elevation      Interim History:   followup visit for this 67 year old woman with chronic lymphocytic leukemia in remission. She was in one of the first cohorts of patients treated with FCR  on protocol at the [facility] at time of her initial diagnosis in July 2002. Technically she had right stage III disease due to anemia but she had no organomegaly or lymphadenopathy on exam. FCR was started in 12/2001, and continued through 06/2002. She achieved a complete hematologic response and, I believe, also a molecular response by PCR, which has been durable now for over 11 years!  She did develop hypogammaglobulinemia.  As a result, she gets recurrent bronchitis and sinusitis. In April 2013 I started her on monthly intravenous immunoglobulin 400 mg per kilogram and this has significantly impacted on the frequency and severity of her infections. She has had approximately 2 episodes of sinusitis over the last 6 months and none have been as severe as in the past. No other significant infectious episodes.    She has a history of her recurrent squamous cell carcinomas of her skin and has had 2 lesions excised from her left tibial area and another 1 excised from the vulvar area. She has a history of both herpes zoster and herpes simplex infections.  Other than a recent sinusitis she's had no other interim medical problems. IgG levels are in normal range on replacement with most recent level 1090 mg percent on 07/06/2012. IgA and IgM levels remain severely depressed attesting to the persistent underlying immune defect.    Medications: reviewed  Allergies:  Allergies  Allergen Reactions  . Penicillins   . Pentamidine   . Sulfonamide  Derivatives     Review of Systems: Constitutional:   No constitutional symptoms  Respiratory:No cough or dyspnea  Cardiovascular:  No chest pain or palpitations  Gastrointestinal:No abdominal pain or change in bowel habit  Genito-Urinary: No urinary tract symptoms  Musculoskeletal:No muscle, bone, or joint pain  Neurologic:No headache or change in vision  Skin:No rash or ecchymosis no new skin lesions  Remaining ROS negative.  Physical Exam: Blood pressure 149/89, pulse 96, temperature 97.7 F (36.5 C), temperature source Oral, resp. rate 20, height 5\' 4"  (1.626 m), weight 226 lb 9.6 oz (102.785 kg). Wt Readings from Last 3 Encounters:  09/28/12 226 lb 9.6 oz (102.785 kg)  08/03/12 221 lb 3.2 oz (100.336 kg)  07/07/12 221 lb 3.2 oz (100.336 kg)     General appearance: Well-nourished Caucasian woman  HENNT: Periorbital and facial edema, pharynx no erythema or exudate  Lymph nodes: No cervical, supraclavicular, or axillary adenopathy  Breasts: Lungs:Clear to auscultation resonant to percussion  Heart:Regular rhythm no murmur  Abdomen:Soft, obese, nontender, no mass, no organomegaly  Extremities:No edema, no calf tenderness  Vascular:No cyanosis  Neurologic:Grossly normal  Skin:No rash or ecchymosis  Lab Results: Lab Results  Component Value Date   WBC 4.3 09/28/2012   HGB 14.1 09/28/2012   HCT 42.3 09/28/2012   MCV 89.2 09/28/2012   PLT 143* 09/28/2012     Chemistry      Component Value Date/Time   NA 143 09/28/2012 1058   NA 133* 05/11/2012 0833   K 3.8 09/28/2012 1058   K 3.8 05/11/2012 4098  CL 108* 09/28/2012 1058   CL 101 05/11/2012 0833   CO2 23 09/28/2012 1058   CO2 22 05/11/2012 0833   BUN 13.3 09/28/2012 1058   BUN 18 05/11/2012 0833   CREATININE 1.1 09/28/2012 1058   CREATININE 1.1 05/11/2012 0833      Component Value Date/Time   CALCIUM 9.6 09/28/2012 1058   CALCIUM 9.0 05/11/2012 0833   ALKPHOS 62 09/28/2012 1058   ALKPHOS 49 05/11/2012 0837   AST 39* 09/28/2012 1058   AST  38* 05/11/2012 0837   ALT 49 09/28/2012 1058   ALT 41* 05/11/2012 0837   BILITOT 0.41 09/28/2012 1058   BILITOT 0.9 05/11/2012 0837       Radiological Studies: No results found.  Impression and Plan:  1. Chronic lymphocytic leukemia - she remains in hematologic remission now out over 11 years from initiation of fludarabine, Cytoxan, Rituxan which is amazing. 2. Thrombocytopenia - resolved. Likely medication related or viral infection related 3. .Recurrent sinopulmonary infections combined effects of hypogammaglobulinemia and the B. and T-cell deficit associated with CLL. She has had decreased frequency and severity of infections on monthly IVIG. I'm going to continue the IVIG through April which will be one year of treatment. I will then stop and observe and resume only if infections reoccur. 4. Recurrent squamous cell carcinomas of the skin likely also a reflection of chronic immunosuppression. - she continues close followup with Dr. Emily Filbert. 5. History of herpes zoster infection. 6. History of herpes simplex virus infection 7. Fluctuating, mild transaminase elevations negative hepatitis A, B., C.     CC:. Dr. Rene Paci; Elmon Else; Jetty Duhamel; and Leonard Schwartz M.D. [name]      Levert Feinstein, [place] 2/5/201412:11 PM

## 2012-10-06 NOTE — Progress Notes (Signed)
Addendum entry for 09/28/12.  IVIG started at 1340hr, pt. Discharge note at 1529hr. Infusion total approximately 1 hr and 40 mins. HL

## 2012-10-25 ENCOUNTER — Telehealth: Payer: Self-pay | Admitting: *Deleted

## 2012-10-25 NOTE — Telephone Encounter (Signed)
Patient called and moved her appt to later in the morning due to possible weather tomorrow. JMW

## 2012-10-26 ENCOUNTER — Other Ambulatory Visit (HOSPITAL_BASED_OUTPATIENT_CLINIC_OR_DEPARTMENT_OTHER): Payer: 59 | Admitting: Lab

## 2012-10-26 ENCOUNTER — Other Ambulatory Visit: Payer: Self-pay | Admitting: Nurse Practitioner

## 2012-10-26 ENCOUNTER — Ambulatory Visit (HOSPITAL_BASED_OUTPATIENT_CLINIC_OR_DEPARTMENT_OTHER): Payer: 59

## 2012-10-26 VITALS — BP 158/74 | HR 81 | Temp 98.4°F | Resp 20

## 2012-10-26 LAB — CBC WITH DIFFERENTIAL/PLATELET
BASO%: 0 % (ref 0.0–2.0)
EOS%: 1.5 % (ref 0.0–7.0)
LYMPH%: 45.9 % (ref 14.0–49.7)
MCHC: 33.5 g/dL (ref 31.5–36.0)
MCV: 88.1 fL (ref 79.5–101.0)
MONO%: 7 % (ref 0.0–14.0)
NEUT#: 1.8 10*3/uL (ref 1.5–6.5)
Platelets: 128 10*3/uL — ABNORMAL LOW (ref 145–400)
RBC: 4.78 10*6/uL (ref 3.70–5.45)
RDW: 13.4 % (ref 11.2–14.5)
WBC: 4 10*3/uL (ref 3.9–10.3)
nRBC: 0 % (ref 0–0)

## 2012-10-26 LAB — COMPREHENSIVE METABOLIC PANEL
ALT: 62 U/L — ABNORMAL HIGH (ref 0–35)
AST: 49 U/L — ABNORMAL HIGH (ref 0–37)
Alkaline Phosphatase: 57 U/L (ref 39–117)
Creatinine, Ser: 1.1 mg/dL (ref 0.50–1.10)
Sodium: 142 mEq/L (ref 135–145)
Total Bilirubin: 0.4 mg/dL (ref 0.3–1.2)
Total Protein: 7.1 g/dL (ref 6.0–8.3)

## 2012-10-26 MED ORDER — SODIUM CHLORIDE 0.9 % IV SOLN
Freq: Once | INTRAVENOUS | Status: AC
  Administered 2012-10-26: 10:00:00 via INTRAVENOUS

## 2012-10-26 MED ORDER — DIPHENHYDRAMINE HCL 25 MG PO CAPS
50.0000 mg | ORAL_CAPSULE | Freq: Four times a day (QID) | ORAL | Status: DC | PRN
  Administered 2012-10-26: 50 mg via ORAL

## 2012-10-26 MED ORDER — IMMUNE GLOBULIN (HUMAN) 20 GM/200ML IV SOLN
40.0000 g | Freq: Once | INTRAVENOUS | Status: AC
  Administered 2012-10-26: 40 g via INTRAVENOUS
  Filled 2012-10-26: qty 400

## 2012-10-26 MED ORDER — METHYLPREDNISOLONE SODIUM SUCC 40 MG IJ SOLR
40.0000 mg | Freq: Once | INTRAMUSCULAR | Status: AC
  Administered 2012-10-26: 40 mg via INTRAVENOUS

## 2012-10-26 MED ORDER — ACETAMINOPHEN 325 MG PO TABS
650.0000 mg | ORAL_TABLET | Freq: Once | ORAL | Status: AC
  Administered 2012-10-26: 650 mg via ORAL

## 2012-10-26 NOTE — Patient Instructions (Signed)
Immune Globulin Injection What is this medicine? IMMUNE GLOBULIN (im MUNE GLOB yoo lin) helps to prevent or reduce the severity of certain infections in patients who are at risk. This medicine is collected from the pooled blood of many donors. It is used to treat immune system problems, thrombocytopenia, and Kawasaki syndrome. This medicine may be used for other purposes; ask your health care Palmyra Rogacki or pharmacist if you have questions. What should I tell my health care Bradie Lacock before I take this medicine? They need to know if you have any of these conditions: - diabetes - extremely low or no immune antibodies in the blood - heart disease - history of blood clots - hyperprolinemia - infection in the blood, sepsis - kidney disease - taking medicine that may change kidney function - ask your health care Gabbrielle Mcnicholas about your medicine - an unusual or allergic reaction to human immune globulin, albumin, maltose, sucrose, polysorbate 80, other medicines, foods, dyes, or preservatives - pregnant or trying to get pregnant - breast-feeding How should I use this medicine? This medicine is for injection into a muscle or infusion into a vein or skin. It is usually given by a health care professional in a hospital or clinic setting. In rare cases, some brands of this medicine might be given at home. You will be taught how to give this medicine. Use exactly as directed. Take your medicine at regular intervals. Do not take your medicine more often than directed. Talk to your pediatrician regarding the use of this medicine in children. Special care may be needed. Overdosage: If you think you have taken too much of this medicine contact a poison control center or emergency room at once. NOTE: This medicine is only for you. Do not share this medicine with others. What if I miss a dose? It is important not to miss your dose. Call your doctor or health care professional if you are unable to keep an  appointment. If you give yourself the medicine and you miss a dose, take it as soon as you can. If it is almost time for your next dose, take only that dose. Do not take double or extra doses. What may interact with this medicine? -aspirin and aspirin-like medicines -cisplatin -cyclosporine -medicines for infection like acyclovir, adefovir, amphotericin B, bacitracin, cidofovir, foscarnet, ganciclovir, gentamicin, pentamidine, vancomycin -NSAIDS, medicines for pain and inflammation, like ibuprofen or naproxen -pamidronate -vaccines -zoledronic acid This list may not describe all possible interactions. Give your health care Kobey Sides a list of all the medicines, herbs, non-prescription drugs, or dietary supplements you use. Also tell them if you smoke, drink alcohol, or use illegal drugs. Some items may interact with your medicine. What should I watch for while using this medicine? Your condition will be monitored carefully while you are receiving this medicine. This medicine is made from pooled blood donations of many different people. It may be possible to pass an infection in this medicine. However, the donors are screened for infections and all products are tested for HIV and hepatitis. The medicine is treated to kill most or all bacteria and viruses. Talk to your doctor about the risks and benefits of this medicine. Do not have vaccinations for at least 14 days before, or until at least 3 months after receiving this medicine. What side effects may I notice from receiving this medicine? Side effects that you should report to your doctor or health care professional as soon as possible: -allergic reactions like skin rash, itching or hives, swelling of   the face, lips, or tongue -breathing problems -chest pain or tightness -fever, chills -headache with nausea, vomiting -neck pain or difficulty moving neck -pain when moving eyes -pain, swelling, warmth in the leg -problems with balance,  talking, walking -sudden weight gain -swelling of the ankles, feet, hands -trouble passing urine or change in the amount of urine Side effects that usually do not require medical attention (report to your doctor or health care professional if they continue or are bothersome): -dizzy, drowsy -flushing -increased sweating -leg cramps -muscle aches and pains -pain at site where injected This list may not describe all possible side effects. Call your doctor for medical advice about side effects. You may report side effects to FDA at 1-800-FDA-1088. Where should I keep my medicine? Keep out of the reach of children. This drug is usually given in a hospital or clinic and will not be stored at home. In rare cases, some brands of this medicine may be given at home. If you are using this medicine at home, you will be instructed on how to store this medicine. Throw away any unused medicine after the expiration date on the label. NOTE: This sheet is a summary. It may not cover all possible information. If you have questions about this medicine, talk to your doctor, pharmacist, or health care Valentine Barney.  2012, [employer]. (11/01/2008 11:44:49 AM) 

## 2012-11-03 ENCOUNTER — Telehealth: Payer: Self-pay

## 2012-11-03 NOTE — Telephone Encounter (Signed)
Received message from pt stating she wanted to know if MD had any concerns about her lab results, specifically her LDH?  Pt asked to call her cell number, as power is not on, and land line is not working.  Note to MD's desk for review.

## 2012-11-23 ENCOUNTER — Ambulatory Visit (HOSPITAL_BASED_OUTPATIENT_CLINIC_OR_DEPARTMENT_OTHER): Payer: 59

## 2012-11-23 ENCOUNTER — Other Ambulatory Visit: Payer: Self-pay | Admitting: *Deleted

## 2012-11-23 ENCOUNTER — Other Ambulatory Visit (HOSPITAL_BASED_OUTPATIENT_CLINIC_OR_DEPARTMENT_OTHER): Payer: 59 | Admitting: Lab

## 2012-11-23 VITALS — BP 144/69 | HR 72 | Temp 97.5°F | Resp 18

## 2012-11-23 DIAGNOSIS — D839 Common variable immunodeficiency, unspecified: Secondary | ICD-10-CM

## 2012-11-23 DIAGNOSIS — D801 Nonfamilial hypogammaglobulinemia: Secondary | ICD-10-CM

## 2012-11-23 DIAGNOSIS — C9111 Chronic lymphocytic leukemia of B-cell type in remission: Secondary | ICD-10-CM

## 2012-11-23 LAB — COMPREHENSIVE METABOLIC PANEL (CC13)
ALT: 69 U/L — ABNORMAL HIGH (ref 0–55)
BUN: 19.8 mg/dL (ref 7.0–26.0)
CO2: 25 mEq/L (ref 22–29)
Creatinine: 1.1 mg/dL (ref 0.6–1.1)
Total Bilirubin: 0.6 mg/dL (ref 0.20–1.20)

## 2012-11-23 LAB — CBC WITH DIFFERENTIAL/PLATELET
BASO%: 0.2 % (ref 0.0–2.0)
Eosinophils Absolute: 0.1 10*3/uL (ref 0.0–0.5)
HCT: 43.7 % (ref 34.8–46.6)
LYMPH%: 44.5 % (ref 14.0–49.7)
MCHC: 33.6 g/dL (ref 31.5–36.0)
MCV: 87.9 fL (ref 79.5–101.0)
MONO%: 8.4 % (ref 0.0–14.0)
NEUT%: 45.8 % (ref 38.4–76.8)
Platelets: 142 10*3/uL — ABNORMAL LOW (ref 145–400)
RBC: 4.97 10*6/uL (ref 3.70–5.45)
nRBC: 0 % (ref 0–0)

## 2012-11-23 MED ORDER — METHYLPREDNISOLONE SODIUM SUCC 40 MG IJ SOLR
40.0000 mg | Freq: Once | INTRAMUSCULAR | Status: AC
  Administered 2012-11-23: 40 mg via INTRAVENOUS

## 2012-11-23 MED ORDER — DIPHENHYDRAMINE HCL 25 MG PO CAPS
50.0000 mg | ORAL_CAPSULE | Freq: Four times a day (QID) | ORAL | Status: DC | PRN
  Administered 2012-11-23: 50 mg via ORAL

## 2012-11-23 MED ORDER — ACETAMINOPHEN 325 MG PO TABS
650.0000 mg | ORAL_TABLET | Freq: Once | ORAL | Status: AC
  Administered 2012-11-23: 650 mg via ORAL

## 2012-11-23 MED ORDER — IMMUNE GLOBULIN (HUMAN) 20 GM/200ML IV SOLN
40.0000 g | Freq: Once | INTRAVENOUS | Status: AC
  Administered 2012-11-23: 40 g via INTRAVENOUS
  Filled 2012-11-23: qty 400

## 2012-11-23 MED ORDER — SODIUM CHLORIDE 0.9 % IJ SOLN
3.0000 mL | Freq: Once | INTRAMUSCULAR | Status: DC | PRN
  Filled 2012-11-23: qty 10

## 2012-11-23 NOTE — Patient Instructions (Signed)

## 2012-11-24 ENCOUNTER — Telehealth: Payer: Self-pay | Admitting: *Deleted

## 2012-11-24 NOTE — Telephone Encounter (Signed)
Message copied by Sabino Snipes on Wed Nov 24, 2012  1:36 PM ------      Message from: Levert Feinstein      Created: Wed Nov 24, 2012  8:02 AM       Call pt LDH now normal ------

## 2012-11-24 NOTE — Telephone Encounter (Signed)
Pt notified that LDH now normal per Dr Cyndie Chime.

## 2013-01-07 ENCOUNTER — Telehealth: Payer: Self-pay | Admitting: Internal Medicine

## 2013-01-07 MED ORDER — AZITHROMYCIN 250 MG PO TABS: ORAL_TABLET | ORAL | Status: DC

## 2013-01-07 NOTE — Telephone Encounter (Signed)
Spoke with patient, Patient states she had a cold last week and now feels like it may have turned into a sinus infection. States her allergies have been bothering her all week, she has chest and sinus congestion, her sinus hurt especially in the R cheek and across her forehead States she took Sudafed and Afrin yesterday, and did her Netti Pot this AM-- had green mucus come out R nostril Patient states she really doesn't want to be on an abx but since its Friday she doesn't want it to progress over the weekend esp since she has a "weakened immune system" Patient denies any f/c/s Dr. Maple Hudson please advise, thank you!  Pharamacy: [facility]  Last OV: 07/07/12 Next OV:07/05/13  Allergies  Allergen Reactions  . Penicillins   . Pentamidine   . Sulfonamide Derivatives

## 2013-01-07 NOTE — Telephone Encounter (Signed)
Spoke with patient, patient aware Rx has been called into [facility] Nothing further needed at this time

## 2013-01-07 NOTE — Telephone Encounter (Signed)
Per CY-lets give patient Zpak #1 take as directed no refills. 

## 2013-01-20 ENCOUNTER — Other Ambulatory Visit (HOSPITAL_BASED_OUTPATIENT_CLINIC_OR_DEPARTMENT_OTHER): Payer: 59 | Admitting: Lab

## 2013-01-20 DIAGNOSIS — D839 Common variable immunodeficiency, unspecified: Secondary | ICD-10-CM

## 2013-01-20 DIAGNOSIS — D801 Nonfamilial hypogammaglobulinemia: Secondary | ICD-10-CM

## 2013-01-20 DIAGNOSIS — C9111 Chronic lymphocytic leukemia of B-cell type in remission: Secondary | ICD-10-CM

## 2013-01-20 LAB — CBC WITH DIFFERENTIAL/PLATELET
BASO%: 0.1 % (ref 0.0–2.0)
Basophils Absolute: 0 10*3/uL (ref 0.0–0.1)
Eosinophils Absolute: 0.1 10*3/uL (ref 0.0–0.5)
HCT: 42.7 % (ref 34.8–46.6)
LYMPH%: 35.1 % (ref 14.0–49.7)
MONO#: 0.4 10*3/uL (ref 0.1–0.9)
NEUT#: 2.5 10*3/uL (ref 1.5–6.5)
NEUT%: 53.9 % (ref 38.4–76.8)
Platelets: 157 10*3/uL (ref 145–400)
lymph#: 1.6 10*3/uL (ref 0.9–3.3)

## 2013-01-20 LAB — COMPREHENSIVE METABOLIC PANEL (CC13)
ALT: 49 U/L (ref 0–55)
Albumin: 3.7 g/dL (ref 3.5–5.0)
CO2: 26 mEq/L (ref 22–29)
Chloride: 109 mEq/L — ABNORMAL HIGH (ref 98–107)
Potassium: 4 mEq/L (ref 3.5–5.1)
Sodium: 144 mEq/L (ref 136–145)
Total Bilirubin: 0.64 mg/dL (ref 0.20–1.20)
Total Protein: 6.8 g/dL (ref 6.4–8.3)

## 2013-01-20 LAB — LACTATE DEHYDROGENASE (CC13): LDH: 213 U/L (ref 125–245)

## 2013-01-21 ENCOUNTER — Telehealth: Payer: Self-pay | Admitting: Internal Medicine

## 2013-01-21 LAB — IGG, IGA, IGM: IgM, Serum: 6 mg/dL — ABNORMAL LOW (ref 52–322)

## 2013-01-21 NOTE — Telephone Encounter (Signed)
Bianca Parker, I do not see that you called the pt No results pending Please advise thanks

## 2013-01-21 NOTE — Telephone Encounter (Signed)
Pt was just calling to schedule her an appt to see CDY. Nothing further was needed

## 2013-01-21 NOTE — Telephone Encounter (Signed)
I have not called the patient; may be an old message from attempting to give patient results; unsure. Thanks

## 2013-01-25 ENCOUNTER — Other Ambulatory Visit: Payer: 59

## 2013-01-25 ENCOUNTER — Telehealth: Payer: Self-pay | Admitting: *Deleted

## 2013-01-25 NOTE — Telephone Encounter (Signed)
Message copied by Sabino Snipes on Tue Jan 25, 2013  3:56 PM ------      Message from: Levert Feinstein      Created: Sun Jan 23, 2013 11:21 PM       Call pt CBC normal including platelet count, liver tests now back to normal ------

## 2013-01-25 NOTE — Telephone Encounter (Signed)
Received return call from pt & results of labs given per Dr.Granfortuna's instructions & she expressed appreciation.

## 2013-02-01 ENCOUNTER — Other Ambulatory Visit: Payer: 59 | Admitting: Lab

## 2013-02-01 ENCOUNTER — Telehealth: Payer: Self-pay | Admitting: Oncology

## 2013-02-01 ENCOUNTER — Ambulatory Visit (HOSPITAL_BASED_OUTPATIENT_CLINIC_OR_DEPARTMENT_OTHER): Payer: 59 | Admitting: Nurse Practitioner

## 2013-02-01 VITALS — BP 138/80 | HR 97 | Temp 97.7°F | Resp 18 | Ht 64.0 in | Wt 214.7 lb

## 2013-02-01 DIAGNOSIS — D839 Common variable immunodeficiency, unspecified: Secondary | ICD-10-CM

## 2013-02-01 DIAGNOSIS — C9111 Chronic lymphocytic leukemia of B-cell type in remission: Secondary | ICD-10-CM

## 2013-02-01 NOTE — Progress Notes (Signed)
OFFICE PROGRESS NOTE  Interval history:   Bianca Parker is a 67 year old woman diagnosed with RAI stage III CLL in July 2002. She was treated on protocol at [facility] with FCR. She achieved a complete response that has been durable. She developed hypogammaglobulinemia and as a result gets recurrent bronchitis and sinusitis. She was started on a trial of IVIG on 11/25/2011. She completed 1 year of IVIG therapy on 11/23/2012.  She is seen today for scheduled followup. Since the last IVIG infusion she has had 1 sinus infection. Her main complaint today is "allergies". She feels congested. She has an upcoming appointment with Dr. Maple Hudson. She denies fevers. No shortness of breath. She has occasional night sweats. She continues close followup with dermatology. She has a good appetite. She has recently lost some weight. She attributes the weight loss to changing her diet.   Objective: Blood pressure 138/80, pulse 97, temperature 97.7 F (36.5 C), temperature source Oral, resp. rate 18, height 5\' 4"  (1.626 m), weight 214 lb 11.2 oz (97.387 kg).  Oropharynx is without thrush or ulceration. No palpable cervical, supraclavicular, axillary or inguinal lymph nodes. Lungs are clear. No wheezes or rales. Regular cardiac rhythm. Abdomen is soft, obese. Nontender. No organomegaly. Extremities are without edema. Calves are soft and nontender.  Lab Results: Lab Results  Component Value Date   WBC 4.6 01/20/2013   HGB 14.3 01/20/2013   HCT 42.7 01/20/2013   MCV 87.9 01/20/2013   PLT 157 01/20/2013    Chemistry:    Chemistry      Component Value Date/Time   NA 144 01/20/2013 0801   NA 142 10/26/2012 1144   K 4.0 01/20/2013 0801   K 4.0 10/26/2012 1144   CL 109* 01/20/2013 0801   CL 107 10/26/2012 1144   CO2 26 01/20/2013 0801   CO2 24 10/26/2012 1144   BUN 14.6 01/20/2013 0801   BUN 16 10/26/2012 1144   CREATININE 1.0 01/20/2013 0801   CREATININE 1.10 10/26/2012 1144      Component Value Date/Time   CALCIUM 9.4  01/20/2013 0801   CALCIUM 9.3 10/26/2012 1144   ALKPHOS 67 01/20/2013 0801   ALKPHOS 57 10/26/2012 1144   AST 33 01/20/2013 0801   AST 49* 10/26/2012 1144   ALT 49 01/20/2013 0801   ALT 62* 10/26/2012 1144   BILITOT 0.64 01/20/2013 0801   BILITOT 0.4 10/26/2012 1144       Studies/Results: No results found.  Medications: I have reviewed the patient's current medications.  Assessment/Plan:  1. CLL. She remains in hematologic remission. 2. Hypogammaglobulinemia with recurrent sinopulmonary infections. She began monthly IVIG in April 2013 with a significant decrease in infections. The IVIG was continued for one year. 3. Intermittent diarrhea. Status post negative upper and lower endoscopy. She is followed by Dr. Christella Hartigan. 4. Elevation of transaminases on 01/06/2012. MRI on 01/28/2012 with mild hepatic steatosis. Transaminases were in normal range on labs done 01/20/2013. 5. Recurrent squamous cell carcinomas of the skin. She continues close followup with dermatology. 6. History of herpes zoster infection. 7. History of herpes simplex virus 2.  Disposition-Bianca Parker appears stable. She has had 1 sinus infection since completing the one-year course of IVIG in April. She knows to contact the office with recurrent infections. Otherwise we will see her in followup in 3 months.  Lonna Cobb ANP/GNP-BC

## 2013-02-01 NOTE — Telephone Encounter (Signed)
gv and printed appt sched pt going out of town.Marland KitchenMarland KitchenMarland Kitchen

## 2013-02-15 ENCOUNTER — Encounter: Payer: Self-pay | Admitting: Internal Medicine

## 2013-02-15 ENCOUNTER — Other Ambulatory Visit: Payer: 59

## 2013-02-15 ENCOUNTER — Ambulatory Visit (INDEPENDENT_AMBULATORY_CARE_PROVIDER_SITE_OTHER): Payer: 59 | Admitting: Internal Medicine

## 2013-02-15 VITALS — BP 130/80 | HR 73 | Ht 64.5 in | Wt 219.6 lb

## 2013-02-15 DIAGNOSIS — J309 Allergic rhinitis, unspecified: Secondary | ICD-10-CM

## 2013-02-15 DIAGNOSIS — J209 Acute bronchitis, unspecified: Secondary | ICD-10-CM

## 2013-02-15 DIAGNOSIS — J3089 Other allergic rhinitis: Secondary | ICD-10-CM

## 2013-02-15 LAB — ALLERGY FULL PROFILE
Alternaria Alternata: <0.1 kU/L
Aspergillus fumigatus, m3: <0.1 kU/L
Bahia Grass: <0.1 kU/L
Bermuda Grass: <0.1 kU/L
Candida Albicans: <0.1 kU/L
Cat Dander: <0.1 kU/L
D. farinae: <0.1 kU/L
Dog Dander: <0.1 kU/L
Elm IgE: <0.1 kU/L
G009 Red Top: <0.1 kU/L
Goldenrod: <0.1 kU/L
Helminthosporium halodes: <0.1 kU/L
Lamb's Quarters: <0.1 kU/L
Plantain: <0.1 kU/L
Sycamore Tree: <0.1 kU/L

## 2013-02-15 NOTE — Patient Instructions (Addendum)
Order lab- Allergy profile   Dx allergic rhinitis  We discussed possibly trying Singulair in the future if needed

## 2013-02-15 NOTE — Progress Notes (Signed)
Subjective:    Patient ID: Bianca Parker, female    DOB: 02-20-1946, 67 y.o.   MRN: [id number]  HPI 5/2HPI  64 yoF followed for Allergic rhinitis, chronic rhinosinusitis. Hx chemoRx for CLL in remission.  Last here- March 9. She responded to antibiotic, but then got another cold clearing more quickly. Saw Dr Ezzard Standing- recognized "congestion" left nostril.  Denies infection now. Catches colds very quickly from her grandchild.  Coming today off antihistamines for allergy testing.  Demonstrated immunoglobulin deficiency as of 6/ 2011, residual from her chemotherapy of 8-9 years ago, reviewed.  She has encasings and HEPA filter.  Allergy profile- 11/01/10- IgE < 1.5; no specific elevations. Also low IgG for penicillin  Skin testing- weak positive intradermal reactions to common inhalants.   02/24/11- 65 yoF followed for Allergic rhinitis, chronic rhinosinusitis. Hx chemoRx for CLL in remission Since last here she has done fairly well with fexofenadine. She has had one "sinus infection" since last here. We talked again about non-bacterial sinusitis. She emphases that she gets fever, bed-ridden malaise. Very concerned about her host resistance because of her leukemia hx. We discussed how to culture, risks of allergy and resistance with frequent antibiotics.  Today she feels well- 2 months without an infection. Realizes risks with exposure to her gransdson's virus colds.   07/01/11-  65 yoF followed for Allergic rhinitis, chronic rhinosinusitis. Hx chemoRx for CLL in remission/ Dr Cyndie Chime Has had flu vaccine. A cold  has spread to her family. She started herself with saline nasal rinse using boiled well water. Got Zicam lozenges and thinks she fought off the cold. Takes probiotics routinely. Her CLL may not be in remission now. Neutrophils and platelets are down. 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  07/07/12- 66 yoF followed for Allergic rhinitis, chronic rhinosinusitis.  Hx chemoRx for CLL in remission/ Dr Cyndie Chime, acquired hypogammaglobulinemia Recently having ? bronchitis flare-was put on Pred pak from PCP. No sinus congestion or sinus trouble. Somewhat better but not completly gone. She chronic bronchitis 3 weeks ago from her husband- prednisone taper from Dr.Leschber. Did shake off earlier cold. Was treated with Avelox in September. Now scant white phlegm or pale green. Some rattle. No fever or chills. Mucinex DM helps. Dr Cyndie Chime heme-onc treating acquired hypogammaglobulinemia with IVIG. Her CLL remains in remission.  02/15/13- 67 yoF followed for Allergic rhinitis, chronic rhinosinusitis. Hx chemoRx for CLL in remission/ Dr Cyndie Chime, acquired hypogammaglobulinemia ACUTE VISIT: recently having flare ups; allergy flare ups as well. Stayed congested and stuffy -almost like a cold. Blames spring allergy season. Home has a new heating system and duct. Feeling better with summer weather. Benadryl and Allegra daily. Flonase raised glaucoma pressure. Does Neti pot with 1-2 drops of Afrin.  Review of Systems- see HPI Constitutional:   No-   weight loss, night sweats, fevers, chills, fatigue, lassitude. HEENT:   No-  headaches, difficulty swallowing, tooth/dental problems, sore throat,       No-  sneezing, itching, ear ache, +nasal congestion, post nasal drip,  CV:  No-   chest pain, orthopnea, PND, swelling in lower extremities, anasarca, dizziness, palpitations Resp: No-   shortness of breath with exertion or at rest.              +   productive cough,  + non-productive cough,  No- coughing up of blood.              + change in color of mucus.  No- wheezing.  Skin: No-   rash or lesions. GI:  No-   heartburn, indigestion, abdominal pain, nausea, vomiting,  GU: . MS:  No-   joint pain or swelling. Neuro-     nothing unusual Psych:  No- change in mood or affect. No depression or anxiety.  No memory loss.  Objective:   Physical Exam General- Alert,  Oriented, Affect-appropriate, Distress- none acute; well appearing, obese Skin- rash-none, lesions- none, excoriation- none Lymphadenopathy- none Head- atraumatic            Eyes- Gross vision intact, PERRLA, conjunctivae clear secretions            Ears- Hearing, canals-normal            Nose- +turbinate edema, no-Septal dev, mucus, polyps, erosion, perforation             Throat- Mallampati II , mucosa clear , drainage- none, tonsils- atrophic Neck- flexible , trachea midline, no stridor , thyroid nl, carotid no bruit Chest - symmetrical excursion , unlabored           Heart/CV- RRR , no murmur , no gallop  , no rub, nl s1 s2                           - JVD- none , edema- none, stasis changes- none, varices- none           Lung- clear to P&A, wheeze- none, cough- none , dullness-none, rub- none           Chest wall-  Abd-  Br/ Gen/ Rectal- Not done, not indicated Extrem- cyanosis- none, clubbing, none, atrophy- none, strength- nl Neuro- grossly intact to observation

## 2013-03-02 NOTE — Progress Notes (Signed)
Quick Note:  Pt aware of results. ______ 

## 2013-03-03 NOTE — Assessment & Plan Note (Signed)
Occasional acute bronchitis. Is no longer getting IVIG therapy as part of her CLL.

## 2013-03-03 NOTE — Assessment & Plan Note (Signed)
Allergic and nonallergic rhinitis, improving as spring season passes. Plan-lab for allergy profile. Consider Singulair

## 2013-03-10 ENCOUNTER — Telehealth: Payer: Self-pay | Admitting: *Deleted

## 2013-03-10 NOTE — Telephone Encounter (Signed)
Received call from pt stating that she was to call if she has any infection.  She reports that she had a bad tooth & has been on clindamycin since last sat & had it pulled yesterday.  She reports that she will be tapering the clindamycin & stop it on Mon.  She thinks the infection was from a cracked root & related only to the tooth & not her immune issues.  Reported to Lonna Cobb NP.

## 2013-05-12 ENCOUNTER — Other Ambulatory Visit: Payer: Self-pay

## 2013-05-12 DIAGNOSIS — Z1231 Encounter for screening mammogram for malignant neoplasm of breast: Secondary | ICD-10-CM

## 2013-05-18 ENCOUNTER — Telehealth: Payer: Self-pay | Admitting: *Deleted

## 2013-05-18 NOTE — Telephone Encounter (Signed)
Patient called and left message this am.  She saw Dr.Gruber this morning for her "skin check" and Dr.Gruber felt a lump on her left side that she thought was "too deep".  Dr. Danella Deis was going to refer her to a surgeon for this, but after discussing it decided to check with Dr. Cyndie Chime first and see what he thinks.   Patient call back number is [phone].

## 2013-05-18 NOTE — Telephone Encounter (Signed)
Called patient back and let her know that Dr. Cyndie Chime agrees with Dr. Danella Deis and that she should see a surgeon.  Patient will call Dr. Danella Deis back and let her know so that Dr.Gruber can make the referral.

## 2013-05-19 ENCOUNTER — Encounter (INDEPENDENT_AMBULATORY_CARE_PROVIDER_SITE_OTHER): Payer: Self-pay | Admitting: Surgery

## 2013-05-19 ENCOUNTER — Ambulatory Visit (INDEPENDENT_AMBULATORY_CARE_PROVIDER_SITE_OTHER): Payer: 59 | Admitting: Surgery

## 2013-05-19 VITALS — BP 150/100 | HR 96 | Temp 98.6°F | Resp 15 | Ht 64.5 in | Wt 217.8 lb

## 2013-05-19 DIAGNOSIS — R109 Unspecified abdominal pain: Secondary | ICD-10-CM | POA: Insufficient documentation

## 2013-05-19 NOTE — Progress Notes (Signed)
Re:   Bianca Parker DOB:   1946-07-10 MRN:   [id number]  ASSESSMENT AND PLAN: 1.  Lump/pain over left 10th rib/left flank  There is no specific abnormality that I can feel.  I can see no reason to do any imaging studies.  I tried to reassure the patient.  This is in an anatomic area where there is not much.  I gave her information on lipomas (though I could not feel one).  She knows if there are continued problems, I would be happy to see her.  Return appointment to me on a PRN basis.  2.  History of CLL - diagnosed in 2002  Treated through [facility] in 2003.  In remission. 3.  History of Hepatitis A 4.  Hypogammaglobulinemia  Required IV IG transfusion in 2013 for recurrent upper respiratory problems. 5.  Morbid obesity  Chief Complaint  Patient presents with  . New Evaluation    eval nodule on lower back   REFERRING PHYSICIAN: Rene Paci, MD  HISTORY OF PRESENT ILLNESS: Bianca Parker is a 67 y.o. (DOB: 1946-08-17)  white  female whose primary care physician is Rene Paci, MD and comes to me today for pain, questionable mass on left flank.  The patient has a history of CLL treated in 2003 and now in remission.  Over about the last month she has had some vague pain in the left lateral lower chest and left flank.  She thinks that she feels some mass there, but it comes and goes and cannot always be found.  She said that her husband has not felt it. She saw Dr. Campbell Stall for dermatology yesterday, who could feel a mass.  She has had no skin lesion in that area.  She does not remember any trauma to that area.   Past Medical History  Diagnosis Date  . Anemia   . CARCINOMA, SKIN, SQUAMOUS CELL   . Allergic rhinitis, cause unspecified   . Chronic lymphoid leukemia in remission 2002 dx, tx 2003  . Dyslipidemia   . Hypogammaglobulinemia, acquired 11/18/2011    Due to CLL and prior chemo  . Hyperlipidemia   . Hepatitis A antibody positive 04/06/2012    Acute  diarrheal illness & elevated liver function tests 02/18/12;      Past Surgical History  Procedure Laterality Date  . Abdominal hysterectomy  1966  . Tonsillectomy    . Bunionectomy    . Sigmoidoscopy       Current Outpatient Prescriptions  Medication Sig Dispense Refill  . Calcium Carbonate-Vitamin D (CALTRATE 600+D) 600-400 MG-UNIT per tablet Take 1 tablet by mouth daily.      . fexofenadine (ALLEGRA) 180 MG tablet Take 180 mg by mouth daily.        . Loperamide HCl (IMODIUM A-D PO) Take by mouth as needed.      . Multiple Vitamin (MULTIVITAMIN) tablet Take 1 tablet by mouth daily.      . Probiotic Product (PHILLIPS COLON HEALTH) CAPS Take by mouth daily.        . diphenhydrAMINE (BENADRYL) 25 MG tablet Take 25 mg by mouth at bedtime as needed.         No current facility-administered medications for this visit.   Facility-Administered Medications Ordered in Other Visits  Medication Dose Route Frequency Provider Last Rate Last Dose  . diphenhydrAMINE (BENADRYL) capsule 50 mg  50 mg Oral Q6H PRN Levert Feinstein, MD   50 mg at 09/28/12 1256  Allergies  Allergen Reactions  . Penicillins   . Pentamidine   . Sulfonamide Derivatives     REVIEW OF SYSTEMS: Skin:  No history of rash.  No history of abnormal moles. Infection:  History of cracked tooth during the summer of 2014 that lead to an infection.  Better now. Neurologic:  No history of stroke.  No history of seizure.  No history of headaches. Cardiac:  No history of hypertension. No history of heart disease.  No history of prior cardiac catheterization.  No history of seeing a cardiologist. Pulmonary:  Upper airway/sinus infections last year - 2013.  Saw Dr. Salena Saner. Sheridyn Canino/C. Young.  Received IV IG and got better.  Endocrine:  No diabetes. No thyroid disease.L Gastrointestinal:  About 6 months ago, had some diarrhea and mildly elevated LFT's.  LFT's on 01/20/2013 were normal.   Saw Dr. Kennedy Bucker, who did an upper endo and  colonoscopy.  Took Imodium for the diarrhea, but the diarrhea has just about resolved. Urologic:  No history of kidney stones.  No history of bladder infections. Musculoskeletal:  No history of joint or back disease. Hematologic:  History of CLL - 2002.  Hypogammaglobulinemia Psycho-social:  The patient is oriented.   The patient has no obvious psychologic or social impairment to understanding our conversation and plan.  SOCIAL and FAMILY HISTORY: Married.  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  PHYSICAL EXAM: BP 150/100  Pulse 96  Temp(Src) 98.6 F (37 C) (Temporal)  Resp 15  Ht 5' 4.5" (1.638 m)  Wt 217 lb 12.8 oz (98.793 kg)  BMI 36.82 kg/m2  General: Obese WF who is alert and generally healthy appearing.  HEENT: Normal. Pupils equal. Neck: Supple. No mass.  No thyroid mass. Lymph Nodes:  No supraclavicular, cervical, or axillary nodes. Lungs: Clear to auscultation and symmetric breath sounds. Heart:  RRR. No murmur or rub. Chest:  The patient points to an area over the left lateral 10th rib as where she has felt the mass.  But she could not feel a mass today.  This area is well below the axilla.  There is not much anatomically over there.  And I cannot feel anything abnormal.  She has some discomfort when I press on her 10th rib. Breast:  Left:  No mass  Right:  No mass  Abdomen: Soft. No mass. No tenderness. No hernia. Normal bowel sounds.  Rectal: Not done. Extremities:  Good strength and ROM  in upper and lower extremities. Neurologic:  Grossly intact to motor and sensory function. Psychiatric: Has normal mood and affect. Behavior is normal.   DATA REVIEWED: Old Epic notes.  Ovidio Kin, MD,  [facility] [address] [place]  [address]   [place], [place]    [address] Phone:  [phone] FAX:  [phone]

## 2013-05-30 ENCOUNTER — Other Ambulatory Visit (HOSPITAL_BASED_OUTPATIENT_CLINIC_OR_DEPARTMENT_OTHER): Payer: 59

## 2013-05-30 DIAGNOSIS — C9111 Chronic lymphocytic leukemia of B-cell type in remission: Secondary | ICD-10-CM

## 2013-05-30 LAB — CBC WITH DIFFERENTIAL/PLATELET
BASO%: 0.4 % (ref 0.0–2.0)
EOS%: 1.4 % (ref 0.0–7.0)
LYMPH%: 36.4 % (ref 14.0–49.7)
MCHC: 33.7 g/dL (ref 31.5–36.0)
MCV: 89.1 fL (ref 79.5–101.0)
MONO%: 8.7 % (ref 0.0–14.0)
Platelets: 162 10*3/uL (ref 145–400)
RBC: 4.9 10*6/uL (ref 3.70–5.45)

## 2013-05-30 LAB — LACTATE DEHYDROGENASE (CC13): LDH: 239 U/L (ref 125–245)

## 2013-05-30 LAB — COMPREHENSIVE METABOLIC PANEL (CC13)
ALT: 88 U/L — ABNORMAL HIGH (ref 0–55)
AST: 55 U/L — ABNORMAL HIGH (ref 5–34)
Albumin: 4.1 g/dL (ref 3.5–5.0)
BUN: 16.5 mg/dL (ref 7.0–26.0)
CO2: 24 mEq/L (ref 22–29)
Calcium: 9.9 mg/dL (ref 8.4–10.4)
Chloride: 110 mEq/L — ABNORMAL HIGH (ref 98–109)
Creatinine: 1 mg/dL (ref 0.6–1.1)
Glucose: 97 mg/dl (ref 70–140)
Sodium: 144 mEq/L (ref 136–145)
Total Bilirubin: 0.75 mg/dL (ref 0.20–1.20)
Total Protein: 6.7 g/dL (ref 6.4–8.3)

## 2013-05-31 LAB — IGG, IGA, IGM: IgM, Serum: 4 mg/dL — ABNORMAL LOW (ref 52–322)

## 2013-06-06 ENCOUNTER — Telehealth: Payer: Self-pay | Admitting: Oncology

## 2013-06-06 ENCOUNTER — Ambulatory Visit (HOSPITAL_BASED_OUTPATIENT_CLINIC_OR_DEPARTMENT_OTHER): Payer: 59 | Admitting: Oncology

## 2013-06-06 VITALS — BP 154/88 | HR 79 | Temp 96.8°F | Resp 20 | Ht 64.5 in | Wt 218.8 lb

## 2013-06-06 DIAGNOSIS — D839 Common variable immunodeficiency, unspecified: Secondary | ICD-10-CM

## 2013-06-06 DIAGNOSIS — C4492 Squamous cell carcinoma of skin, unspecified: Secondary | ICD-10-CM

## 2013-06-06 DIAGNOSIS — C9111 Chronic lymphocytic leukemia of B-cell type in remission: Secondary | ICD-10-CM

## 2013-06-06 DIAGNOSIS — C4490 Unspecified malignant neoplasm of skin, unspecified: Secondary | ICD-10-CM

## 2013-06-06 DIAGNOSIS — D801 Nonfamilial hypogammaglobulinemia: Secondary | ICD-10-CM

## 2013-06-06 NOTE — Progress Notes (Signed)
Hematology and Oncology Follow Up Visit  Bianca Parker [id number] November 03, 1945 67 y.o. 06/06/2013 8:04 PM   Principle Diagnosis: Encounter Diagnoses  Name Primary?  . Chronic lymphoid leukemia in remission Yes  . Hypogammaglobulinemia, acquired   . CARCINOMA, SKIN, SQUAMOUS CELL   . LFT elevation      Interim History:   Followup visit for this 66 year old woman with chronic lymphocytic leukemia in remission. She was in one of the first cohorts of patients treated with FCR on protocol at the [facility] at time of her initial diagnosis in July 2002. Technically she had RAI  stage III disease due to anemia but she had no organomegaly or lymphadenopathy on exam. FCR was started in 12/2001, and continued through 06/2002. She achieved a complete hematologic response and, I believe, also a molecular response by PCR, which has been durable now for over 11 years!  She did develop hypogammaglobulinemia. As a result, she gets recurrent bronchitis and sinusitis. In April 2013 I started her on monthly intravenous immunoglobulin 400 mg per kilogram and this has significantly impacted on the frequency and severity of her infections. This was continued through April of 2014. She has had no new infections since stopping the IVIG but as expected, immunoglobulin levels have fallen to pretreatment levels.   She has a history of her recurrent squamous cell carcinomas of her skin and has had 2 lesions excised from her left tibial area and another 1 excised from the vulvar area. Recently, she had a basal cell carcinoma removed.  She has a history of both herpes zoster and herpes simplex infections.   Overall she feels quite well at this time. She felt a tiny subcutaneous nodule posterior left flank. She did see a Development worker, international aid. He did not feel that this required any biopsies at this time. She was advised to call if it changed and a followup appointment was also arranged.    Medications:  reviewed  Allergies:  Allergies  Allergen Reactions  . Penicillins   . Pentamidine   . Sulfonamide Derivatives     Review of Systems: Constitutional:    no anorexia, fever, weight loss  HEENT: No sore throat  Respiratory: no cough or dyspnea  Cardiovascular:   no chest pain or palpitations  Gastrointestinal: no change in bowel habit  Genito-Urinary:  not questioned  Musculoskeletal: no muscle bone or joint pain  Neurologic: no headache or change in vision  Skin: no rash or ecchymosis   Remaining ROS negative.   Physical Exam: Blood pressure 154/88, pulse 79, temperature 96.8 F (36 C), temperature source Oral, resp. rate 20, height 5' 4.5" (1.638 m), weight 218 lb 12.8 oz (99.247 kg). Wt Readings from Last 3 Encounters:  06/06/13 218 lb 12.8 oz (99.247 kg)  05/19/13 217 lb 12.8 oz (98.793 kg)  02/15/13 219 lb 9.6 oz (99.61 kg)     General appearance:  well-nourished Caucasian woman  HENNT:  pharynx no erythema exudate or ulcer. No thyromegaly  Lymph nodes:  no cervical, supraclavicular, or axillary adenopathy  Breasts: Lungs: clear to auscultation resonant to percussion  Heart: regular rhythm no murmur  Abdomen: soft, nontender, no mass, no organomegaly  Extremities: no edema, no calf tenderness  Musculoskeletal: no joint deformities  GU: Vascular: no carotid bruits, no cyanosis  Neurologic: no focal deficit  Skin:No rash or ecchymosis. Tiny less than 0.5 cm subcutaneous nodule felt on the left flank posteriorly suspect small lipoma.   Lab Results: CBC W/Diff  Component Value Date/Time   WBC 6.0 05/30/2013 0829   WBC 7.9 05/11/2012 0833   RBC 4.90 05/30/2013 0829   RBC 4.83 05/11/2012 0833   HGB 14.7 05/30/2013 0829   HGB 14.5 05/11/2012 0833   HCT 43.7 05/30/2013 0829   HCT 43.5 05/11/2012 0833   PLT 162 05/30/2013 0829   PLT 142.0* 05/11/2012 0833   MCV 89.1 05/30/2013 0829   MCV 89.9 05/11/2012 0833   MCH 30.1 05/30/2013 0829   MCH 31.3 09/11/2009 1539   MCHC  33.7 05/30/2013 0829   MCHC 33.3 05/11/2012 0833   RDW 13.7 05/30/2013 0829   RDW 13.9 05/11/2012 0833   LYMPHSABS 2.2 05/30/2013 0829   LYMPHSABS 1.9 05/11/2012 0833   MONOABS 0.5 05/30/2013 0829   MONOABS 0.5 05/11/2012 0833   EOSABS 0.1 05/30/2013 0829   EOSABS 0.1 05/11/2012 0833   BASOSABS 0.0 05/30/2013 0829   BASOSABS 0.0 05/11/2012 0833     Chemistry      Component Value Date/Time   NA 144 05/30/2013 0829   NA 142 10/26/2012 1144   K 4.2 05/30/2013 0829   K 4.0 10/26/2012 1144   CL 109* 01/20/2013 0801   CL 107 10/26/2012 1144   CO2 24 05/30/2013 0829   CO2 24 10/26/2012 1144   BUN 16.5 05/30/2013 0829   BUN 16 10/26/2012 1144   CREATININE 1.0 05/30/2013 0829   CREATININE 1.10 10/26/2012 1144      Component Value Date/Time   CALCIUM 9.9 05/30/2013 0829   CALCIUM 9.3 10/26/2012 1144   ALKPHOS 59 05/30/2013 0829   ALKPHOS 57 10/26/2012 1144   AST 55* 05/30/2013 0829   AST 49* 10/26/2012 1144   ALT 88* 05/30/2013 0829   ALT 62* 10/26/2012 1144   BILITOT 0.75 05/30/2013 0829   BILITOT 0.4 10/26/2012 1144      Impression: #1. Chronic lymphocytic leukemia in an ongoing remission now out over 11 years from treatment.  #2. Hypogammaglobulinemia secondary to CLL and treatment  #3. Recurrent sinopulmonary infections secondary to #2. Significant decrease in frequency and severity of infections on monthly IVIG given for one year through April 2014. I will continue to monitor her status. If she starts to get recurrent infections again I will put her back on the IVIG.  #4. Recurrent squamous cell carcinomas of the skin Ongoing followup with dermatology Dr. Emily Filbert.  #5. History of herpes zoster infection  #6. History of herpes simplex infection  #7. Fluctuating mild transaminase elevations. Negative hepatitis A, B., C., MRI of the liver done in 2013 with mild fatty infiltration otherwise normal.       Levert Feinstein, MD 10/13/20148:04 PM

## 2013-06-06 NOTE — Telephone Encounter (Signed)
Gave pt oral contrast for CT on 10/15 °

## 2013-06-17 ENCOUNTER — Ambulatory Visit
Admission: RE | Admit: 2013-06-17 | Discharge: 2013-06-17 | Disposition: A | Discharge status: Home / Self Care | Payer: 59 | Source: Ambulatory Visit

## 2013-06-17 DIAGNOSIS — Z1231 Encounter for screening mammogram for malignant neoplasm of breast: Secondary | ICD-10-CM

## 2013-06-17 LAB — LIPID PANEL
Cholesterol: 267 mg/dL — AB (ref 0–200)
LDL Cholesterol: 173 mg/dL
Triglycerides: 243 mg/dL — AB (ref 40–160)

## 2013-06-17 LAB — HM PAP SMEAR

## 2013-06-17 LAB — TSH: TSH: 5.37 u[IU]/mL (ref 0.41–5.90)

## 2013-06-22 ENCOUNTER — Ambulatory Visit (INDEPENDENT_AMBULATORY_CARE_PROVIDER_SITE_OTHER): Payer: 59 | Admitting: Internal Medicine

## 2013-06-22 ENCOUNTER — Encounter: Payer: Self-pay | Admitting: Internal Medicine

## 2013-06-22 VITALS — BP 102/72 | HR 111 | Temp 97.9°F | Wt 215.0 lb

## 2013-06-22 DIAGNOSIS — E785 Hyperlipidemia, unspecified: Secondary | ICD-10-CM

## 2013-06-22 DIAGNOSIS — R7989 Other specified abnormal findings of blood chemistry: Secondary | ICD-10-CM

## 2013-06-22 DIAGNOSIS — D801 Nonfamilial hypogammaglobulinemia: Secondary | ICD-10-CM

## 2013-06-22 DIAGNOSIS — D839 Common variable immunodeficiency, unspecified: Secondary | ICD-10-CM

## 2013-06-22 DIAGNOSIS — J328 Other chronic sinusitis: Secondary | ICD-10-CM

## 2013-06-22 MED ORDER — MOXIFLOXACIN HCL 400 MG PO TABS: 400.0000 mg | ORAL_TABLET | Freq: Every day | ORAL | Status: DC

## 2013-06-22 NOTE — Assessment & Plan Note (Signed)
Recurrent infections - uses doxy or Avelox as needed per pulm Also manage allergy symptoms as ongoing Interval history reviewed - The current medical regimen is effective;  continue present plan and medications.

## 2013-06-22 NOTE — Patient Instructions (Signed)
We have reviewed your labs from gynecology and we'll continue to monitor cholesterol every 6-12 months at this time  Medications reviewed and updated, no changes recommended  Printed prescription for Avelox provided to you to use in case of infection  Continue to work on diet/exercise changes as discussed  follow up in 6 months for cholesterol recheck and review, please call sooner if problems

## 2013-06-22 NOTE — Assessment & Plan Note (Signed)
IVIG transfusion periodically ongoing  follow up heme onc as ongoing -interval history reviewed

## 2013-06-22 NOTE — Assessment & Plan Note (Signed)
Elevated, but stable trend without meds unable to loose weight despite reported efforts at diet and aerobic exercise changes Previously on lipitor 20mg  qhs 10/2011, stopped 12/2011 due to increase LFTs LFTs have improved but not normalized, presumably due to Ig disorder - follows with hematology for same Consider statin in the future if LDL greater than 190, but given role for primary prevention and history of adverse reaction, will focus on lifestyle modification at this time

## 2013-06-22 NOTE — Progress Notes (Signed)
Pre-visit discussion using our clinic review tool. No additional management support is needed unless otherwise documented below in the visit note.  

## 2013-06-22 NOTE — Assessment & Plan Note (Signed)
Chronic issue ongoing since summer 2013 Exacerbated by statin trial for 3 months March through May 2013 ( when statin discontinued) LFTs improved, but never normalized GI evaluation for same summer 2013 reviewed Christella Hartigan) -no specific GI etiology identified. Abdominal ultrasound May 2013 with mild fatty liver infiltration, no gallstones or bile duct dilation May be related to immune globulin disorder. Following with hematology for same Recent labs reviewed, consider restart statin trial in future if continued increase LDL trend despite lifestyle modification

## 2013-06-22 NOTE — Progress Notes (Signed)
Subjective:    Patient ID: Bianca Parker, female    DOB: 04-22-1946, 67 y.o.   MRN: [id number]  HPI Here for follow up -reviewed lipid panel recently drawn at gynecologist  Also chronic medical issues reviewed:  CLL - in remission - dx 2002 and tx 12/2001 with clinical trial at [facility] - follows with local onc every 4-6 months - poor "immune system response" due to same  Chronic allg rhinitis/sinusitus - freq flares and recurrent infections, ongoing now for last 2 weeks- reports compliance with ongoing medical treatment and no changes in medication dose or frequency. denies adverse side effects related to current therapy. follows with ENT and allergist for same  hx squam cell cancer, skin - 4 areas excised positive for cancer - sees derm 3-4x/year - no current skin changes   Dyslipidemia - dx 04/2011- off lipitor 12/2011 due to increase LFTs - heme monitors LFTs. takes omega 3, works on low fat diet and exercise as tolerated   Past Medical History  Diagnosis Date  . Anemia   . CARCINOMA, SKIN, SQUAMOUS CELL   . Allergic rhinitis, cause unspecified   . Chronic lymphoid leukemia in remission 2002 dx, tx 2003  . Dyslipidemia   . Hypogammaglobulinemia, acquired 11/18/2011    Due to CLL and prior chemo  . Hyperlipidemia   . Hepatitis A antibody positive 04/06/2012    Acute diarrheal illness & elevated liver function tests 02/18/12;    Review of Systems  Constitutional: Negative for fever, diaphoresis, fatigue and unexpected weight change.  HENT: Positive for postnasal drip and sinus pressure. Negative for rhinorrhea and sneezing.   Respiratory: Negative for cough and shortness of breath.   Cardiovascular: Negative for chest pain and palpitations.  Allergic/Immunologic: Positive for immunocompromised state.       Objective:   Physical Exam BP 102/72  Pulse 111  Temp(Src) 97.9 F (36.6 C) (Oral)  Wt 215 lb (97.523 kg)  BMI 36.35 kg/m2  SpO2 96% Wt Readings from Last 3  Encounters:  06/22/13 215 lb (97.523 kg)  06/06/13 218 lb 12.8 oz (99.247 kg)  05/19/13 217 lb 12.8 oz (98.793 kg)   Constitutional: She is overweight;  well-developed and well-nourished. No distress.  HENT: Head: Normocephalic and atraumatic. Nose: Nose mild discolored nasal thin discharge with swollen turbinates. Mouth/Throat: Oropharynx is clear and moist, moderate erythema. No oropharyngeal exudate. Ears: B TMs normal Eyes: Conjunctivae and EOM are normal. Pupils are equal, round, and reactive to light. No scleral icterus.  Neck: Normal range of motion. Neck supple. No JVD present. No thyromegaly present.  Cardiovascular: Normal rate, regular rhythm and normal heart sounds.  No murmur heard. Pulmonary/Chest: Effort normal and breath sounds with scattered rhonchi. No respiratory distress. She has no wheezes. Psychiatric: She has a normal mood and affect. Her behavior is normal. Judgment and thought content normal.   Lab Results  Component Value Date   WBC 6.0 05/30/2013   HGB 14.7 05/30/2013   HCT 43.7 05/30/2013   PLT 162 05/30/2013   CHOL 267* 06/17/2013   TRIG 243* 06/17/2013   HDL 45 06/17/2013   LDLDIRECT 176.8 11/12/2011   ALT 88* 05/30/2013   AST 55* 05/30/2013   NA 144 05/30/2013   K 4.2 05/30/2013   CL 109* 01/20/2013   CREATININE 1.0 05/30/2013   BUN 16.5 05/30/2013   CO2 24 05/30/2013   TSH 5.37 06/17/2013        Assessment & Plan:   See problem list. Medications and labs  reviewed today.

## 2013-06-30 ENCOUNTER — Other Ambulatory Visit: Payer: Self-pay

## 2013-07-05 ENCOUNTER — Encounter: Payer: Self-pay | Admitting: Internal Medicine

## 2013-07-05 ENCOUNTER — Ambulatory Visit: Payer: 59 | Admitting: Internal Medicine

## 2013-07-05 ENCOUNTER — Ambulatory Visit (INDEPENDENT_AMBULATORY_CARE_PROVIDER_SITE_OTHER): Payer: 59 | Admitting: Internal Medicine

## 2013-07-05 VITALS — BP 112/68 | HR 92 | Ht 64.5 in | Wt 213.0 lb

## 2013-07-05 DIAGNOSIS — J328 Other chronic sinusitis: Secondary | ICD-10-CM

## 2013-07-05 DIAGNOSIS — J302 Other seasonal allergic rhinitis: Secondary | ICD-10-CM

## 2013-07-05 DIAGNOSIS — D839 Common variable immunodeficiency, unspecified: Secondary | ICD-10-CM

## 2013-07-05 DIAGNOSIS — Z23 Encounter for immunization: Secondary | ICD-10-CM

## 2013-07-05 DIAGNOSIS — J309 Allergic rhinitis, unspecified: Secondary | ICD-10-CM

## 2013-07-05 DIAGNOSIS — D801 Nonfamilial hypogammaglobulinemia: Secondary | ICD-10-CM

## 2013-07-05 DIAGNOSIS — J209 Acute bronchitis, unspecified: Secondary | ICD-10-CM

## 2013-07-05 NOTE — Patient Instructions (Signed)
Pneumococcal conjugate 13 vaccine   Please call as needed

## 2013-07-05 NOTE — Progress Notes (Signed)
Subjective:    Patient ID: Bianca Parker, female    DOB: May 27, 1946, 67 y.o.   MRN: [id number]  HPI 5/2HPI  38 yoF followed for Allergic rhinitis, chronic rhinosinusitis. Hx chemoRx for CLL in remission.  Last here- March 9. She responded to antibiotic, but then got another cold clearing more quickly. Saw Dr Ezzard Standing- recognized "congestion" left nostril.  Denies infection now. Catches colds very quickly from her grandchild.  Coming today off antihistamines for allergy testing.  Demonstrated immunoglobulin deficiency as of 6/ 2011, residual from her chemotherapy of 8-9 years ago, reviewed.  She has encasings and HEPA filter.  Allergy profile- 11/01/10- IgE < 1.5; no specific elevations. Also low IgG for penicillin  Skin testing- weak positive intradermal reactions to common inhalants.   02/24/11- 65 yoF followed for Allergic rhinitis, chronic rhinosinusitis. Hx chemoRx for CLL in remission Since last here she has done fairly well with fexofenadine. She has had one "sinus infection" since last here. We talked again about non-bacterial sinusitis. She emphases that she gets fever, bed-ridden malaise. Very concerned about her host resistance because of her leukemia hx. We discussed how to culture, risks of allergy and resistance with frequent antibiotics.  Today she feels well- 2 months without an infection. Realizes risks with exposure to her gransdson's virus colds.   07/01/11-  65 yoF followed for Allergic rhinitis, chronic rhinosinusitis. Hx chemoRx for CLL in remission/ Dr Cyndie Chime Has had flu vaccine. A cold  has spread to her family. She started herself with saline nasal rinse using boiled well water. Got Zicam lozenges and thinks she fought off the cold. Takes probiotics routinely. Her CLL may not be in remission now. Neutrophils and platelets are down. 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  07/07/12- 66 yoF followed for Allergic rhinitis, chronic rhinosinusitis.  Hx chemoRx for CLL in remission/ Dr Cyndie Chime, acquired hypogammaglobulinemia Recently having ? bronchitis flare-was put on Pred pak from PCP. No sinus congestion or sinus trouble. Somewhat better but not completly gone. She chronic bronchitis 3 weeks ago from her husband- prednisone taper from Dr.Leschber. Did shake off earlier cold. Was treated with Avelox in September. Now scant white phlegm or pale green. Some rattle. No fever or chills. Mucinex DM helps. Dr Cyndie Chime heme-onc treating acquired hypogammaglobulinemia with IVIG. Her CLL remains in remission.  02/15/13- 67 yoF followed for Allergic rhinitis, chronic rhinosinusitis. Hx chemoRx for CLL in remission/ Dr Cyndie Chime, acquired hypogammaglobulinemia ACUTE VISIT: recently having flare ups; allergy flare ups as well. Stayed congested and stuffy -almost like a cold. Blames spring allergy season. Home has a new heating system and duct. Feeling better with summer weather. Benadryl and Allegra daily. Flonase raised glaucoma pressure. Does Neti pot with 1-2 drops of Afrin.  07/05/13- 67 yoF followed for Allergic rhinitis, chronic rhinosinusitis. Hx chemoRx for CLL in remission/ Dr Cyndie Chime, acquired hypogammaglobulinemia FOLLOWS FOR: 5 month follow up.  does report some head congestion with clear mucus, some PND, some prod cough x3 weeks.  denies wheezing, increased SOB, f/c/s. Off IVIG. Fought off a cold without antibiotics. Diagnosed borderline glaucoma. Uses saline nasal rinse if needed.  Review of Systems- see HPI Constitutional:   No-   weight loss, night sweats, fevers, chills, fatigue, lassitude. HEENT:   No-  headaches, difficulty swallowing, tooth/dental problems, sore throat,       No-  sneezing, itching, ear ache, +nasal congestion, post nasal drip,  CV:  No-   chest pain, orthopnea, PND, swelling in lower extremities,  anasarca, dizziness, palpitations Resp: No-   shortness of breath with exertion or at rest.              +    productive cough,  + non-productive cough,  No- coughing up of blood.              + change in color of mucus.  No- wheezing.   Skin: No-   rash or lesions. GI:  No-   heartburn, indigestion, abdominal pain, nausea, vomiting,  GU: . MS:  No-   joint pain or swelling. Neuro-     nothing unusual Psych:  No- change in mood or affect. No depression or anxiety.  No memory loss.  Objective:   Physical Exam General- Alert, Oriented, Affect-appropriate, Distress- none acute; well appearing, obese Skin- rash-none, lesions- none, excoriation- none Lymphadenopathy- none Head- atraumatic            Eyes- Gross vision intact, PERRLA, conjunctivae clear secretions            Ears- Hearing, canals-normal. Sclerosis of TMs            Nose- +turbinate edema, no-Septal dev, mucus, polyps, erosion, perforation             Throat- Mallampati II , mucosa clear , drainage- none, tonsils- atrophic Neck- flexible , trachea midline, no stridor , thyroid nl, carotid no bruit Chest - symmetrical excursion , unlabored           Heart/CV- RRR , no murmur , no gallop  , no rub, nl s1 s2                           - JVD- none , edema- none, stasis changes- none, varices- none           Lung- clear to P&A, wheeze- none, cough+rattle with laughter , dullness-none, rub- none           Chest wall-  Abd-  Br/ Gen/ Rectal- Not done, not indicated Extrem- cyanosis- none, clubbing, none, atrophy- none, strength- nl Neuro- grossly intact to observation

## 2013-07-06 ENCOUNTER — Ambulatory Visit: Payer: 59 | Admitting: Internal Medicine

## 2013-07-21 NOTE — Assessment & Plan Note (Signed)
Fair control with saline. Minimizes Sudafed because of blood pressure

## 2013-07-21 NOTE — Assessment & Plan Note (Signed)
controlled 

## 2013-07-21 NOTE — Assessment & Plan Note (Signed)
Was able to resolve an acute viral upper respiratory infection on her own

## 2013-08-11 ENCOUNTER — Telehealth: Payer: Self-pay | Admitting: Internal Medicine

## 2013-08-11 MED ORDER — OSELTAMIVIR PHOSPHATE 75 MG PO CAPS: 75.0000 mg | ORAL_CAPSULE | Freq: Two times a day (BID) | ORAL | Status: DC

## 2013-08-11 NOTE — Telephone Encounter (Signed)
RX has been sent for pt. Nothing further needed 

## 2013-08-11 NOTE — Telephone Encounter (Signed)
Per CDY: tamiflu 75 mg 1 po BID #10

## 2013-08-11 NOTE — Telephone Encounter (Signed)
I called and spoke with pt. She is wanting an RX for tamiflu to have on hand. She reports she has had a few friends to come down with the flu. She has finished chemo and just doesn;t want to get sick or have to go to the hospital at all if possible. She reports the holiday is coming up and will be exposed to several people. Please advise thanks  Allergies  Allergen Reactions  . Penicillins   . Pentamidine   . Sulfonamide Derivatives

## 2013-09-08 ENCOUNTER — Other Ambulatory Visit: Payer: Self-pay | Admitting: Nurse Practitioner

## 2013-09-10 ENCOUNTER — Telehealth: Payer: Self-pay | Admitting: Hematology and Oncology

## 2013-09-10 NOTE — Telephone Encounter (Signed)
Talked to pt and gave her appt for Dr. Alvy Bimler on April 2015, cancelled appt with ML, former pt of Dr.Granfortuna

## 2013-10-25 ENCOUNTER — Encounter: Payer: Self-pay | Admitting: Oncology

## 2013-11-24 ENCOUNTER — Other Ambulatory Visit: Payer: Self-pay | Admitting: Dermatology

## 2013-12-05 ENCOUNTER — Telehealth: Payer: Self-pay | Admitting: Hematology and Oncology

## 2013-12-05 ENCOUNTER — Ambulatory Visit: Payer: 59 | Admitting: Nurse Practitioner

## 2013-12-05 ENCOUNTER — Ambulatory Visit (HOSPITAL_BASED_OUTPATIENT_CLINIC_OR_DEPARTMENT_OTHER): Payer: 59 | Admitting: Hematology and Oncology

## 2013-12-05 ENCOUNTER — Encounter: Payer: Self-pay | Admitting: Hematology and Oncology

## 2013-12-05 ENCOUNTER — Ambulatory Visit (HOSPITAL_BASED_OUTPATIENT_CLINIC_OR_DEPARTMENT_OTHER): Payer: 59

## 2013-12-05 ENCOUNTER — Other Ambulatory Visit: Payer: 59

## 2013-12-05 VITALS — BP 145/70 | HR 83 | Temp 97.8°F | Resp 18 | Ht 64.0 in | Wt 172.1 lb

## 2013-12-05 DIAGNOSIS — C9110 Chronic lymphocytic leukemia of B-cell type not having achieved remission: Secondary | ICD-10-CM

## 2013-12-05 DIAGNOSIS — C9111 Chronic lymphocytic leukemia of B-cell type in remission: Secondary | ICD-10-CM

## 2013-12-05 DIAGNOSIS — Z85828 Personal history of other malignant neoplasm of skin: Secondary | ICD-10-CM

## 2013-12-05 DIAGNOSIS — D839 Common variable immunodeficiency, unspecified: Secondary | ICD-10-CM

## 2013-12-05 DIAGNOSIS — D801 Nonfamilial hypogammaglobulinemia: Secondary | ICD-10-CM

## 2013-12-05 LAB — COMPREHENSIVE METABOLIC PANEL (CC13)
ALT: 50 U/L (ref 0–55)
ANION GAP: 10 meq/L (ref 3–11)
AST: 42 U/L — ABNORMAL HIGH (ref 5–34)
Albumin: 4.3 g/dL (ref 3.5–5.0)
Alkaline Phosphatase: 66 U/L (ref 40–150)
BUN: 19.6 mg/dL (ref 7.0–26.0)
CALCIUM: 10.1 mg/dL (ref 8.4–10.4)
CHLORIDE: 108 meq/L (ref 98–109)
CO2: 25 meq/L (ref 22–29)
CREATININE: 1.3 mg/dL — AB (ref 0.6–1.1)
Glucose: 104 mg/dl (ref 70–140)
Potassium: 3.9 mEq/L (ref 3.5–5.1)
Sodium: 143 mEq/L (ref 136–145)
Total Bilirubin: 0.35 mg/dL (ref 0.20–1.20)
Total Protein: 6.4 g/dL (ref 6.4–8.3)

## 2013-12-05 LAB — CBC WITH DIFFERENTIAL/PLATELET
BASO%: 0.2 % (ref 0.0–2.0)
BASOS ABS: 0 10*3/uL (ref 0.0–0.1)
EOS ABS: 0.1 10*3/uL (ref 0.0–0.5)
EOS%: 1.9 % (ref 0.0–7.0)
HCT: 44.7 % (ref 34.8–46.6)
HEMOGLOBIN: 14.9 g/dL (ref 11.6–15.9)
LYMPH#: 2.2 10*3/uL (ref 0.9–3.3)
LYMPH%: 31.1 % (ref 14.0–49.7)
MCH: 30.3 pg (ref 25.1–34.0)
MCHC: 33.3 g/dL (ref 31.5–36.0)
MCV: 91.1 fL (ref 79.5–101.0)
MONO#: 0.5 10*3/uL (ref 0.1–0.9)
MONO%: 7.2 % (ref 0.0–14.0)
NEUT%: 59.6 % (ref 38.4–76.8)
NEUTROS ABS: 4.1 10*3/uL (ref 1.5–6.5)
Platelets: 187 10*3/uL (ref 145–400)
RBC: 4.91 10*6/uL (ref 3.70–5.45)
RDW: 13.6 % (ref 11.2–14.5)
WBC: 6.9 10*3/uL (ref 3.9–10.3)

## 2013-12-05 LAB — LACTATE DEHYDROGENASE (CC13): LDH: 228 U/L (ref 125–245)

## 2013-12-05 NOTE — Progress Notes (Signed)
[facility] progress notes  Patient Care Team: Rowe Clack, MD as PCP - General Deneise Lever, MD as Referring Physician (Pulmonary Disease) Rozetta Nunnery, MD as Referring Physician (Otolaryngology) Milus Banister, MD (Gastroenterology) Darlyn Chamber, MD (Obstetrics and Gynecology) Heath Lark, MD as Consulting Physician (Hematology and Oncology)  CHIEF COMPLAINTS/PURPOSE OF VISIT:  CLL on observation  HISTORY OF PRESENTING ILLNESS:  Bianca Parker 68 y.o. female was transferred to my care after her prior physician has left.  I review of her records and collaborated history with the patient She was in one of the first cohorts of patients treated with FCR on protocol at the [facility] at time of her initial diagnosis in July 2002. Technically she had RAI  stage III disease due to anemia but she had no organomegaly or lymphadenopathy on exam. FCR was started in 12/2001, and continued through 06/2002. She achieved a complete hematologic response and,which has been durable now for over 11 years!  She did develop hypogammaglobulinemia. As a result, she gets recurrent bronchitis and sinusitis. In April 2013 she was started her on monthly intravenous immunoglobulin 400 mg per kilogram and this has significantly impacted on the frequency and severity of her infections. This was continued through April of 2014. She has had no new infections since stopping the IVIG but as expected, immunoglobulin levels have fallen to pretreatment levels.   She has a history of her recurrent squamous cell carcinomas of her skin and has had 2 lesions excised from her left tibial area and another 1 excised from the vulvar area. Recently, she had a basal cell carcinoma removed. She has a history of both herpes zoster and herpes simplex infections. Recently, she had abnormal Pap smear and currently on close observation. Recent colposcopy was negative.  Main complaint  is upper respiratory sinus congestion with Colyte symptoms for the past week and a half. According to the patient this is the third bout of viral/allergy type symptoms. She denies any production of greenish phlegm. Denies any fevers or chills. No new lymphadenopathy. She had intentional weight loss. She denies any recent fever, chills, night sweats or abnormal weight loss   MEDICAL HISTORY:  Past Medical History  Diagnosis Date  . Anemia   . CARCINOMA, SKIN, SQUAMOUS CELL   . Allergic rhinitis, cause unspecified   . Chronic lymphoid leukemia in remission 2002 dx, tx 2003  . Dyslipidemia   . Hypogammaglobulinemia, acquired 11/18/2011    Due to CLL and prior chemo  . Hyperlipidemia   . Hepatitis A antibody positive 04/06/2012    Acute diarrheal illness & elevated liver function tests 02/18/12;     SURGICAL HISTORY: Past Surgical History  Procedure Laterality Date  . Abdominal hysterectomy  1966  . Tonsillectomy    . Bunionectomy    . Sigmoidoscopy    . Skin biopsy      SOCIAL HISTORY: History   Social History  . Marital Status: Married    Spouse Name: N/A    Number of Children: N/A  . Years of Education: N/A   Occupational History  . Not on file.   Social History Main Topics  . Smoking status: Never Smoker   . Smokeless tobacco: Never Used     Comment: Married, lives with husband Gershon Crane), grown children. 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 Alcohol Use: Yes     Comment: Rare  . Drug Use: No  . Sexual Activity: Yes  Other Topics Concern  . Not on file   Social History Narrative  . No narrative on file    FAMILY HISTORY: Family History  Problem Relation Age of Onset  . Colon cancer Mother   . Cancer Mother     colon  . Breast cancer Other   . Ovarian cancer Other   . Cancer Maternal Grandmother     ovarian  . Cancer Paternal Grandmother     breast    ALLERGIES:  is allergic to penicillins; pentamidine; and sulfonamide  derivatives.  MEDICATIONS:  Current Outpatient Prescriptions  Medication Sig Dispense Refill  . Calcium Carbonate-Vitamin D (CALTRATE 600+D) 600-400 MG-UNIT per tablet Take 1 tablet by mouth daily.      . fexofenadine (ALLEGRA) 180 MG tablet Take 180 mg by mouth daily.        . Levothyroxine Sodium (SYNTHROID PO) Take 25 mcg by mouth daily.      . Loperamide HCl (IMODIUM A-D PO) Take by mouth as needed.      . Multiple Vitamin (MULTIVITAMIN) tablet Take 1 tablet by mouth daily.       No current facility-administered medications for this visit.   Facility-Administered Medications Ordered in Other Visits  Medication Dose Route Frequency Provider Last Rate Last Dose  . diphenhydrAMINE (BENADRYL) capsule 50 mg  50 mg Oral Q6H PRN Annia Belt, MD   50 mg at 09/28/12 1256    REVIEW OF SYSTEMS:   Constitutional: Denies fevers, chills or abnormal night sweats Eyes: Denies blurriness of vision, double vision or watery eyes Cardiovascular: Denies palpitation, chest discomfort or lower extremity swelling Gastrointestinal:  Denies nausea, heartburn or change in bowel habits Skin: Denies abnormal skin rashes Lymphatics: Denies new lymphadenopathy or easy bruising Neurological:Denies numbness, tingling or new weaknesses Behavioral/Psych: Mood is stable, no new changes  All other systems were reviewed with the patient and are negative.  PHYSICAL EXAMINATION: ECOG PERFORMANCE STATUS: 1 - Symptomatic but completely ambulatory  Filed Vitals:   12/05/13 1445  BP: 145/70  Pulse: 83  Temp: 97.8 F (36.6 C)  Resp: 18   Filed Weights   12/05/13 1445  Weight: 172 lb 1.6 oz (78.064 kg)    GENERAL:alert, no distress and comfortable SKIN: skin color, texture, turgor are normal, no rashes or significant lesions EYES: normal, conjunctiva are pink and non-injected, sclera clear OROPHARYNX:no exudate, normal lips, buccal mucosa, and tongue  NECK: supple, thyroid normal size, non-tender,  without nodularity LYMPH:  no palpable lymphadenopathy in the cervical, axillary or inguinal LUNGS: clear to auscultation and percussion with normal breathing effort HEART: regular rate & rhythm and no murmurs without lower extremity edema ABDOMEN:abdomen soft, non-tender and normal bowel sounds Musculoskeletal:no cyanosis of digits and no clubbing  PSYCH: alert & oriented x 3 with fluent speech NEURO: no focal motor/sensory deficits  LABORATORY DATA:  I have reviewed the data as listed Lab Results  Component Value Date   WBC 6.9 12/05/2013   HGB 14.9 12/05/2013   HCT 44.7 12/05/2013   MCV 91.1 12/05/2013   PLT 187 12/05/2013    Recent Labs  01/20/13 0801 05/30/13 0829  NA 144 144  K 4.0 4.2  CL 109*  --   CO2 26 24  GLUCOSE 89 97  BUN 14.6 16.5  CREATININE 1.0 1.0  CALCIUM 9.4 9.9  PROT 6.8 6.7  ALBUMIN 3.7 4.1  AST 33 55*  ALT 49 88*  ALKPHOS 67 59  BILITOT 0.64 0.75    ASSESSMENT & PLAN:  #  1 CLL CBC show the patient had no recurrence. I will continue history, physical examination and blood work #2 recurrent sinus congestion/infection The patient would be at risk of lingering infection due to hypogammaglobulinemia. The patient wants to continue to try conservative management with over-the-counter medicine which I think is reasonable #3 hypogammaglobulinemia I suspect the patient may need recurrent treatment with IVIG but the patient wants to hold off due to history of allergic reaction #4 history of abnormal liver function tests Cause is unknown. I will recheck it but if it remains persistently elevated we might need to do CT imaging study to rule out cancer #5 abnormal Pap smear I recommend she continue with close followup with the gynecologist #6 history of skin cancer She needs to followup closely with her dermatologist due to high risk of skin cancer with CLL patients. Orders Placed This Encounter  Procedures  . Comprehensive metabolic panel    Standing  Status: Standing     Number of Occurrences: 9     Standing Expiration Date: 12/06/2014  . CBC with Differential    Standing Status: Standing     Number of Occurrences: 6     Standing Expiration Date: 12/06/2014  . Lactate dehydrogenase    Standing Status: Standing     Number of Occurrences: 3     Standing Expiration Date: 12/06/2014  . IgG, IgA, IgM    Standing Status: Standing     Number of Occurrences: 3     Standing Expiration Date: 12/06/2014    All questions were answered. The patient knows to call the clinic with any problems, questions or concerns. I spent 25 minutes counseling the patient face to face. The total time spent in the appointment was 30 minutes and more than 50% was on counseling.     Heath Lark, MD 12/05/2013 3:59 PM

## 2013-12-05 NOTE — Telephone Encounter (Signed)
Gave pt appt for lab and MD for october 2015,, sent pt to labs today

## 2013-12-06 LAB — IGG, IGA, IGM
IgG (Immunoglobin G), Serum: 141 mg/dL — ABNORMAL LOW (ref 690–1700)
IgM, Serum: 5 mg/dL — ABNORMAL LOW (ref 52–322)

## 2013-12-07 ENCOUNTER — Telehealth: Payer: Self-pay | Admitting: Internal Medicine

## 2013-12-07 ENCOUNTER — Telehealth: Payer: Self-pay | Admitting: *Deleted

## 2013-12-07 NOTE — Telephone Encounter (Signed)
Gave pt lab results.  She verbalized understanding and states still undecided about IVIG again.  She will call if she decides she wants this treatment again or she will just keep her next appt in 6 months as scheduled.

## 2013-12-07 NOTE — Telephone Encounter (Signed)
OK to release results Immunoglobulins are low, but per pt preference will hold off IVIG

## 2013-12-07 NOTE — Telephone Encounter (Signed)
Pt called for her lab results from earlier this week.  She says she is feeling a little better,  Her congestion is clearing up.

## 2013-12-07 NOTE — Telephone Encounter (Signed)
Pt wants a sooner appt with CDY. Pt was seen at the cancer center on Monday and has new provider. Per pt her immune system has been doing fine. So far she has had 3 colds. Pt oncologists wants pt to start IVIG treatments. Pt wants to come in and discuss this with Dr. Annamaria Boots. Please advise Dr. Annamaria Boots thanks

## 2013-12-08 NOTE — Telephone Encounter (Signed)
Per CY-lets work patient into schedule as able. I have spoken with patient and she will be here on Monday 12-12-13 at 1:30pm for ROV with CY to discuss treatment issues/concerns.

## 2013-12-09 ENCOUNTER — Telehealth: Payer: Self-pay | Admitting: *Deleted

## 2013-12-09 ENCOUNTER — Encounter: Payer: Self-pay | Admitting: Hematology and Oncology

## 2013-12-09 NOTE — Telephone Encounter (Signed)
Pt called to ask if her labs could be released to [facility].  Informed her unsure of how to release them once they have already been reviewed by MD, but they should "cross over" to Mychart w/i a few days.  She has appt to see Dr. Annamaria Boots on Monday and wants him to have the results.  Informed her Dr. Annamaria Boots can review her labs in computer, we share the same system. He can also print out copy for her.  She verbalized understanding.

## 2013-12-09 NOTE — Telephone Encounter (Addendum)
CALLED PT. TO INFORM HER THAT I SPOKE TO DR.GORSUCH CONCERNING YOUR Monday LAB RESULTS. SHE HAS SIGNED OFF THE REPORT AND THE RESULTS SHOULD BE RELEASED WITHIN THREE DAYS. IF PT. IS UNABLE TO VIEW THE RESULTS TO CALL THIS OFFICE ON Monday AND REQUEST DR.GORSUCH'S NURSE TO MAIL HER A COPY OF THE LAB RESULTS. PT. HAS ALREADY CALL DR.GORSUCH'S NURSE WHO IS WORKING ON THIS ISSUE.

## 2013-12-12 ENCOUNTER — Ambulatory Visit (INDEPENDENT_AMBULATORY_CARE_PROVIDER_SITE_OTHER): Payer: 59 | Admitting: Internal Medicine

## 2013-12-12 ENCOUNTER — Encounter: Payer: Self-pay | Admitting: Internal Medicine

## 2013-12-12 VITALS — BP 108/60 | HR 75 | Ht 64.5 in | Wt 171.4 lb

## 2013-12-12 DIAGNOSIS — J302 Other seasonal allergic rhinitis: Secondary | ICD-10-CM

## 2013-12-12 DIAGNOSIS — J309 Allergic rhinitis, unspecified: Secondary | ICD-10-CM

## 2013-12-12 DIAGNOSIS — J069 Acute upper respiratory infection, unspecified: Secondary | ICD-10-CM

## 2013-12-12 DIAGNOSIS — J3089 Other allergic rhinitis: Secondary | ICD-10-CM

## 2013-12-12 NOTE — Patient Instructions (Signed)
Call for an antibiotic trial if this bronchitis/ congestion hasn't significantly improved in 2 weeks.  Please call as needed

## 2013-12-12 NOTE — Progress Notes (Signed)
Subjective:    Patient ID: Bianca Parker, female    DOB: July 09, 1946, 68 y.o.   MRN: [id number]  HPI 5/2HPI  41 yoF followed for Allergic rhinitis, chronic rhinosinusitis. Hx chemoRx for CLL in remission.  Last here- March 9. She responded to antibiotic, but then got another cold clearing more quickly. Saw Dr Lucia Gaskins- recognized "congestion" left nostril.  Denies infection now. Catches colds very quickly from her grandchild.  Coming today off antihistamines for allergy testing.  Demonstrated immunoglobulin deficiency as of 6/ 2011, residual from her chemotherapy of 8-9 years ago, reviewed.  She has encasings and HEPA filter.  Allergy profile- 11/01/10- IgE < 1.5; no specific elevations. Also low IgG for penicillin  Skin testing- weak positive intradermal reactions to common inhalants.   02/24/11- 83 yoF followed for Allergic rhinitis, chronic rhinosinusitis. Hx chemoRx for CLL in remission Since last here she has done fairly well with fexofenadine. She has had one "sinus infection" since last here. We talked again about non-bacterial sinusitis. She emphases that she gets fever, bed-ridden malaise. Very concerned about her host resistance because of her leukemia hx. We discussed how to culture, risks of allergy and resistance with frequent antibiotics.  Today she feels well- 2 months without an infection. Realizes risks with exposure to her gransdson's virus colds.   07/01/11-  82 yoF followed for Allergic rhinitis, chronic rhinosinusitis. Hx chemoRx for CLL in remission/ Dr Beryle Beams Has had flu vaccine. A cold  has spread to her family. She started herself with saline nasal rinse using boiled well water. Got Zicam lozenges and thinks she fought off the cold. Takes probiotics routinely. Her CLL may not be in remission now. Neutrophils and platelets are down. 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  07/07/12- 35 yoF followed for Allergic rhinitis, chronic rhinosinusitis.  Hx chemoRx for CLL in remission/ Dr Beryle Beams, acquired hypogammaglobulinemia Recently having ? bronchitis flare-was put on Pred pak from PCP. No sinus congestion or sinus trouble. Somewhat better but not completly gone. She chronic bronchitis 3 weeks ago from her husband- prednisone taper from [place]. Did shake off earlier cold. Was treated with Avelox in September. Now scant white phlegm or pale green. Some rattle. No fever or chills. Mucinex DM helps. Dr Beryle Beams heme-onc treating acquired hypogammaglobulinemia with IVIG. Her CLL remains in remission.  02/15/13- 67 yoF followed for Allergic rhinitis, chronic rhinosinusitis. Hx chemoRx for CLL in remission/ Dr Beryle Beams, acquired hypogammaglobulinemia ACUTE VISIT: recently having flare ups; allergy flare ups as well. Stayed congested and stuffy -almost like a cold. Blames spring allergy season. Home has a new heating system and duct. Feeling better with summer weather. Benadryl and Allegra daily. Flonase raised glaucoma pressure. Does Neti pot with 1-2 drops of Afrin.  07/05/13- 67 yoF followed for Allergic rhinitis, chronic rhinosinusitis. Hx chemoRx for CLL in remission/ Dr Beryle Beams, acquired hypogammaglobulinemia FOLLOWS FOR: 5 month follow up.  does report some head congestion with clear mucus, some PND, some prod cough x3 weeks.  denies wheezing, increased SOB, f/c/s. Off IVIG. Fought off a cold without antibiotics. Diagnosed borderline glaucoma. Uses saline nasal rinse if needed.  12/12/13- 68 yoF followed for Allergic rhinitis, chronic rhinosinusitis. Complicated by Hx chemoRx for CLL in remission, acquired hypogammaglobulinemia FOLLOWS FOR: having congestion; Discuss IVIG treatments as well. She reports having had a good winter. She missed catching a couple of colds and a GI buf in family.  Now caught a cold 2-1/2 weeks ago with head and chest congestion, ears  pop. Sputum only clear white. Now improved but with persistent  hoarseness and mucus. She discussed resumption of IVIG with her oncologist, but would like to wait. Treating herself with Sudafed and Mucinex. Discussed viral versus allergy at this time of year.  Review of Systems- see HPI Constitutional:   No-   weight loss, night sweats, fevers, chills, fatigue, lassitude. HEENT:   No-  headaches, difficulty swallowing, tooth/dental problems, sore throat,       No-  sneezing, itching, ear ache, +nasal congestion, post nasal drip,  CV:  No-   chest pain, orthopnea, PND, swelling in lower extremities, anasarca, dizziness, palpitations Resp: No-   shortness of breath with exertion or at rest.              +   productive cough,  + non-productive cough,  No- coughing up of blood.              No- change in color of mucus.  No- wheezing.   Skin: No-   rash or lesions. GI:  No-   heartburn, indigestion, abdominal pain, nausea, vomiting,  GU: . MS:  No-   joint pain or swelling. Neuro-     nothing unusual Psych:  No- change in mood or affect. No depression or anxiety.  No memory loss.  Objective:   Physical Exam General- Alert, Oriented, Affect-appropriate, Distress- none acute; well appearing, obese Skin- rash-none, lesions- none, excoriation- none Lymphadenopathy- none Head- atraumatic            Eyes- Gross vision intact, PERRLA, conjunctivae clear secretions            Ears- Hearing, canals-normal. Sclerosis of TMs            Nose- +turbinate edema, no-Septal dev, mucus, polyps, erosion, perforation             Throat- Mallampati II , mucosa clear , drainage- none, tonsils- atrophic. + hoarse Neck- flexible , trachea midline, no stridor , thyroid nl, carotid no bruit Chest - symmetrical excursion , unlabored           Heart/CV- RRR , no murmur , no gallop  , no rub, nl s1 s2                           - JVD- none , edema- none, stasis changes- none, varices- none           Lung- clear to P&A, wheeze- none, cough-none , dullness-none, rub- none            Chest wall-  Abd-  Br/ Gen/ Rectal- Not done, not indicated Extrem- cyanosis- none, clubbing, none, atrophy- none, strength- nl Neuro- grossly intact to observation

## 2013-12-12 NOTE — Assessment & Plan Note (Signed)
Given her low scores on previous allergy testing, I favor this being a drawn out viral URI, possibly complicated by some seasonal pollen rhinitis We agreed to wait on antibiotics. If she is no better in 2 weeks and she will call to discuss. Meanwhile continue Sudafed, antihistamines if needed, Mucinex

## 2013-12-16 ENCOUNTER — Telehealth: Payer: Self-pay | Admitting: Internal Medicine

## 2013-12-16 MED ORDER — DOXYCYCLINE HYCLATE 100 MG PO TABS: ORAL_TABLET | ORAL | Status: DC

## 2013-12-16 NOTE — Telephone Encounter (Signed)
Offer doxycycline 100 mg, # 8, 2 today then one daily 

## 2013-12-16 NOTE — Telephone Encounter (Signed)
Pt aware of recs. rx has been sent. Nothing further needed

## 2013-12-16 NOTE — Telephone Encounter (Signed)
I called spoke with pt. She c/o green d/c from nose-this AM, PND, nasal congestion . She wants an ABX called in. Please advise Dr/ Annamaria Boots thanks --pt was seen 12/12/13 Allergies  Allergen Reactions  . Penicillins   . Pentamidine   . Sulfonamide Derivatives      Current Outpatient Prescriptions on File Prior to Visit  Medication Sig Dispense Refill  . Calcium Carbonate-Vitamin D (CALTRATE 600+D) 600-400 MG-UNIT per tablet Take 1 tablet by mouth daily.      . fexofenadine (ALLEGRA) 180 MG tablet Take 180 mg by mouth daily.        . Levothyroxine Sodium (SYNTHROID PO) Take 25 mcg by mouth daily.      . Loperamide HCl (IMODIUM A-D PO) Take by mouth as needed.      . Multiple Vitamin (MULTIVITAMIN) tablet Take 1 tablet by mouth daily.      . Omega-3 Fatty Acids (FISH OIL) 1000 MG CAPS Take by mouth.       Current Facility-Administered Medications on File Prior to Visit  Medication Dose Route Frequency Provider Last Rate Last Dose  . diphenhydrAMINE (BENADRYL) capsule 50 mg  50 mg Oral Q6H PRN Annia Belt, MD   50 mg at 09/28/12 1256

## 2013-12-26 ENCOUNTER — Telehealth: Payer: Self-pay | Admitting: Internal Medicine

## 2013-12-26 NOTE — Telephone Encounter (Signed)
I called spoke with pt. She reports she is 99.9% the bacterial sinus infection is gone. After the 3rd day of abx her symptoms were gone. She is back to feeling normal. She is not starting the IVIG right now. Nothing further needed

## 2013-12-30 ENCOUNTER — Telehealth: Payer: Self-pay | Admitting: *Deleted

## 2013-12-30 NOTE — Telephone Encounter (Signed)
Pt left VM states would like to see Dr. Alvy Bimler to discuss getting IVIG.  She states her congestion has cleared up but she did have abnormal PAP smear and she wants to talk about starting IVIG again.

## 2014-01-01 ENCOUNTER — Other Ambulatory Visit: Payer: Self-pay | Admitting: Hematology and Oncology

## 2014-01-01 NOTE — Telephone Encounter (Signed)
I will add her next week Placed POF

## 2014-01-02 ENCOUNTER — Ambulatory Visit: Payer: 59 | Admitting: Internal Medicine

## 2014-01-02 ENCOUNTER — Telehealth: Payer: Self-pay | Admitting: *Deleted

## 2014-01-02 NOTE — Telephone Encounter (Signed)
Informed pt Dr. Alvy Bimler sent order to Scheduler to get pt scheduled to see her next week on 5/18 w/ IVIG on 5/19.  Expect call from Scheduling to confirm.  She verbalized understanding.

## 2014-01-03 ENCOUNTER — Telehealth: Payer: Self-pay | Admitting: *Deleted

## 2014-01-03 ENCOUNTER — Telehealth: Payer: Self-pay | Admitting: Hematology and Oncology

## 2014-01-03 NOTE — Telephone Encounter (Signed)
Per staff phone call and POF I have schedueld appts.  JMW  

## 2014-01-03 NOTE — Telephone Encounter (Signed)
Talked to pt she is aware of appt on May 18th, left Bianca Parker a VM for IVIG

## 2014-01-03 NOTE — Telephone Encounter (Signed)
Patient called and moved her appt for 5/19 to later in the morning.

## 2014-01-05 ENCOUNTER — Encounter: Payer: Self-pay | Admitting: Physician Assistant

## 2014-01-05 ENCOUNTER — Ambulatory Visit (INDEPENDENT_AMBULATORY_CARE_PROVIDER_SITE_OTHER): Payer: 59 | Admitting: Physician Assistant

## 2014-01-05 ENCOUNTER — Telehealth: Payer: Self-pay | Admitting: *Deleted

## 2014-01-05 VITALS — BP 110/74 | HR 62 | Temp 97.9°F | Resp 18 | Wt 165.0 lb

## 2014-01-05 DIAGNOSIS — L989 Disorder of the skin and subcutaneous tissue, unspecified: Secondary | ICD-10-CM

## 2014-01-05 MED ORDER — CEPHALEXIN 500 MG PO CAPS: 500.0000 mg | ORAL_CAPSULE | Freq: Three times a day (TID) | ORAL | Status: DC

## 2014-01-05 NOTE — Patient Instructions (Signed)
Watch the area on your skin to see if it improves spontaneously. It does not look or sound like infection.  Call the office if the area gets worse or begins to look like infection. Information provided on cellulitis for purpose of return to clinic precaution information.  Follow up as needed, or if symptoms worsen or if fever develops.  Cellulitis Cellulitis is an infection of the skin and the tissue under the skin. The infected area is usually red and tender. This happens most often in the arms and lower legs. HOME CARE   Take your antibiotic medicine as told. Finish the medicine even if you start to feel better.  Keep the infected arm or leg raised (elevated).  Put a warm cloth on the area up to 4 times per day.  Only take medicines as told by your doctor.  Keep all doctor visits as told. GET HELP RIGHT AWAY IF:   You have a fever.  You feel very sleepy.  You throw up (vomit) or have watery poop (diarrhea).  You feel sick and have muscle aches and pains.  You see red streaks on the skin coming from the infected area.  Your red area gets bigger or turns a dark color.  Your bone or joint under the infected area is painful after the skin heals.  Your infection comes back in the same area or different area.  You have a puffy (swollen) bump in the infected area.  You have new symptoms. MAKE SURE YOU:   Understand these instructions.  Will watch your condition.  Will get help right away if you are not doing well or get worse. Document Released: 01/28/2008 Document Revised: 02/10/2012 Document Reviewed: 10/27/2011 [facility] Patient Information 2014 [place], [place].

## 2014-01-05 NOTE — Telephone Encounter (Signed)
Pt reports Infection on a knuckle left hand.  States this is a recurrent infection.  She has appt to see a P.A at her PCP office this afternoon at 2:30 pm but wonders if it would be better to come and see Dr. Alvy Bimler today or a P.A. W/ Dr. Alvy Bimler here today instead?

## 2014-01-05 NOTE — Progress Notes (Signed)
Subjective:    Patient ID: Bianca Parker, female    DOB: 08/13/1946, 68 y.o.   MRN: [id number]  HPI Patient is a 68 year old Caucasian female with a history of leukemia presents to the clinic today for possible skin infection of her finger. Patient states that about a week and a half ago she was gardening, wearing very thick protective gloves, however afterwards noticed some redness on her left second finger from the PIP to the DIP, which was tender directly over the PIP only. Patient states that she put some Mupirocin ointment on the area, and the next day the finger looked less inflamed. She continued this treatment for a few days, and by 1 week ago, the area had completely resolved. Last night, however, the inflammation returned in the same pattern, only a little less severe. She applied the mupirocin ointment again, and this morning the area was looking better, however she wanted to be sure. She called her oncologists, who told her she needed to be seen to rule out cellulitis or infected joint. She states that as the day has gone on, the inflammation is rapidly improving, and the pain is 0/10. She denies fevers, chills, nausea, vomiting, diarrhea, and shortness of breath.   Review of Systems As per the history of present illness and are otherwise negative the   Past Medical History  Diagnosis Date  . Anemia   . CARCINOMA, SKIN, SQUAMOUS CELL   . Allergic rhinitis, cause unspecified   . Chronic lymphoid leukemia in remission 2002 dx, tx 2003  . Dyslipidemia   . Hypogammaglobulinemia, acquired 11/18/2011    Due to CLL and prior chemo  . Hyperlipidemia   . Hepatitis A antibody positive 04/06/2012    Acute diarrheal illness & elevated liver function tests 02/18/12;    Past Surgical History  Procedure Laterality Date  . Abdominal hysterectomy  1966  . Tonsillectomy    . Bunionectomy    . Sigmoidoscopy    . Skin biopsy      reports that she has never smoked. She has never used  smokeless tobacco. She reports that she drinks alcohol. She reports that she does not use illicit drugs. family history includes Breast cancer in her other; Cancer in her maternal grandmother, mother, and paternal grandmother; Colon cancer in her mother; Ovarian cancer in her other. Allergies  Allergen Reactions  . Penicillins   . Pentamidine   . Sulfonamide Derivatives        Objective:   Physical Exam  Nursing note and vitals reviewed. Constitutional: She is oriented to person, place, and time. She appears well-developed and well-nourished. No distress.  HENT:  Head: Normocephalic and atraumatic.  Eyes: Conjunctivae and EOM are normal. Pupils are equal, round, and reactive to light.  Neck: Normal range of motion. Neck supple.  Cardiovascular: Normal rate, regular rhythm, normal heart sounds and intact distal pulses.  Exam reveals no gallop and no friction rub.   No murmur heard. Pulmonary/Chest: Effort normal and breath sounds normal. No respiratory distress. She has no wheezes. She has no rales. She exhibits no tenderness.  Musculoskeletal: Normal range of motion.  Neurological: She is alert and oriented to person, place, and time.  Skin: Skin is warm and dry. No rash noted. She is not diaphoretic. There is erythema. No pallor.  Left 2nd PIP: there is a very small amount of erythema to the medial aspect of the skin over the PIP joint. The joint is not inflamed. There is no pain to  palpation. There is no fluctuance. There is no sign of puncture or visible foreign body.  Psychiatric: She has a normal mood and affect. Her behavior is normal. Judgment and thought content normal.    Filed Vitals:   01/05/14 1432  BP: 110/74  Pulse: 62  Temp: 97.9 F (36.6 C)  Resp: 18    Lab Results  Component Value Date   WBC 6.9 12/05/2013   HGB 14.9 12/05/2013   HCT 44.7 12/05/2013   PLT 187 12/05/2013   GLUCOSE 104 12/05/2013   CHOL 267* 06/17/2013   TRIG 243* 06/17/2013   HDL 45 06/17/2013    LDLDIRECT 176.8 11/12/2011   LDLCALC 173 06/17/2013   ALT 50 12/05/2013   AST 42* 12/05/2013   NA 143 12/05/2013   K 3.9 12/05/2013   CL 109* 01/20/2013   CREATININE 1.3* 12/05/2013   BUN 19.6 12/05/2013   CO2 25 12/05/2013   TSH 5.37 06/17/2013      Assessment & Plan:  Tahjae was seen today for finger redness.  Diagnoses and associated orders for this visit:  Skin lesion - No sign of infection currently, provided pt with a back up paper copy of Cephalexin just incase the area begins to look infected over the weekend and pt can't be seen.  - Provided pt with cellulitis information including return precautions.  Other Orders - cephALEXin (KEFLEX) 500 MG capsule; Take 1 capsule (500 mg total) by mouth 3 (three) times daily.  Follow up as needed. Pt has appointment with oncologist on Monday.   Patient Instructions  Watch the area on your skin to see if it improves spontaneously. It does not look or sound like infection.  Call the office if the area gets worse or begins to look like infection. Information provided on cellulitis for purpose of return to clinic precaution information.  Follow up as needed, or if symptoms worsen or if fever develops.

## 2014-01-05 NOTE — Telephone Encounter (Signed)
I have no opening today to evaluate her I suggest she goes to PCP office first for Rx, and if she does not get better by Monday we will work her in next week

## 2014-01-05 NOTE — Progress Notes (Signed)
Pre visit review using our clinic review tool, if applicable. No additional management support is needed unless otherwise documented below in the visit note. 

## 2014-01-05 NOTE — Telephone Encounter (Signed)
Instructed pt to keep appt w/ PCP PA today to assess her knuckle and start on antibiotic if needed.  Dr. Alvy Bimler will see her on Monday as scheduled.  Pt verbalized understanding.

## 2014-01-08 ENCOUNTER — Telehealth: Payer: Self-pay | Admitting: Hematology and Oncology

## 2014-01-08 NOTE — Telephone Encounter (Signed)
Talked to pt and she is aware of appt for lab,md and IVIG

## 2014-01-09 ENCOUNTER — Other Ambulatory Visit (HOSPITAL_BASED_OUTPATIENT_CLINIC_OR_DEPARTMENT_OTHER): Payer: 59

## 2014-01-09 ENCOUNTER — Telehealth: Payer: Self-pay | Admitting: Hematology and Oncology

## 2014-01-09 ENCOUNTER — Ambulatory Visit (HOSPITAL_BASED_OUTPATIENT_CLINIC_OR_DEPARTMENT_OTHER): Payer: 59 | Admitting: Hematology and Oncology

## 2014-01-09 VITALS — BP 136/70 | HR 69 | Temp 98.0°F | Resp 20 | Ht 64.5 in | Wt 162.0 lb

## 2014-01-09 DIAGNOSIS — R7989 Other specified abnormal findings of blood chemistry: Secondary | ICD-10-CM

## 2014-01-09 DIAGNOSIS — D839 Common variable immunodeficiency, unspecified: Secondary | ICD-10-CM

## 2014-01-09 DIAGNOSIS — C9111 Chronic lymphocytic leukemia of B-cell type in remission: Secondary | ICD-10-CM

## 2014-01-09 DIAGNOSIS — Z85828 Personal history of other malignant neoplasm of skin: Secondary | ICD-10-CM

## 2014-01-09 DIAGNOSIS — C9110 Chronic lymphocytic leukemia of B-cell type not having achieved remission: Secondary | ICD-10-CM

## 2014-01-09 DIAGNOSIS — D801 Nonfamilial hypogammaglobulinemia: Secondary | ICD-10-CM

## 2014-01-09 LAB — COMPREHENSIVE METABOLIC PANEL (CC13)
ALBUMIN: 4.3 g/dL (ref 3.5–5.0)
ALK PHOS: 48 U/L (ref 40–150)
ALT: 41 U/L (ref 0–55)
AST: 38 U/L — AB (ref 5–34)
Anion Gap: 12 mEq/L — ABNORMAL HIGH (ref 3–11)
BUN: 17.1 mg/dL (ref 7.0–26.0)
CO2: 24 mEq/L (ref 22–29)
Calcium: 9.9 mg/dL (ref 8.4–10.4)
Chloride: 108 mEq/L (ref 98–109)
Creatinine: 1 mg/dL (ref 0.6–1.1)
Glucose: 103 mg/dl (ref 70–140)
POTASSIUM: 3.8 meq/L (ref 3.5–5.1)
Sodium: 144 mEq/L (ref 136–145)
Total Bilirubin: 0.8 mg/dL (ref 0.20–1.20)
Total Protein: 6 g/dL — ABNORMAL LOW (ref 6.4–8.3)

## 2014-01-09 LAB — CBC WITH DIFFERENTIAL/PLATELET
BASO%: 0.2 % (ref 0.0–2.0)
BASOS ABS: 0 10*3/uL (ref 0.0–0.1)
EOS ABS: 0.1 10*3/uL (ref 0.0–0.5)
EOS%: 1.1 % (ref 0.0–7.0)
HCT: 42.8 % (ref 34.8–46.6)
HGB: 13.9 g/dL (ref 11.6–15.9)
LYMPH%: 31.4 % (ref 14.0–49.7)
MCH: 30 pg (ref 25.1–34.0)
MCHC: 32.6 g/dL (ref 31.5–36.0)
MCV: 92.2 fL (ref 79.5–101.0)
MONO#: 0.4 10*3/uL (ref 0.1–0.9)
MONO%: 7.5 % (ref 0.0–14.0)
NEUT%: 59.8 % (ref 38.4–76.8)
NEUTROS ABS: 3 10*3/uL (ref 1.5–6.5)
PLATELETS: 133 10*3/uL — AB (ref 145–400)
RBC: 4.64 10*6/uL (ref 3.70–5.45)
RDW: 14.7 % — ABNORMAL HIGH (ref 11.2–14.5)
WBC: 5.1 10*3/uL (ref 3.9–10.3)
lymph#: 1.6 10*3/uL (ref 0.9–3.3)

## 2014-01-09 LAB — LACTATE DEHYDROGENASE (CC13): LDH: 203 U/L (ref 125–245)

## 2014-01-09 NOTE — Telephone Encounter (Signed)
gv and printed appt sched and avs fo rpt for July °

## 2014-01-09 NOTE — Progress Notes (Signed)
[facility] OFFICE PROGRESS NOTE  Patient Care Team: Rowe Clack, MD as PCP - General Deneise Lever, MD as Referring Physician (Pulmonary Disease) Rozetta Nunnery, MD as Referring Physician (Otolaryngology) Milus Banister, MD (Gastroenterology) Darlyn Chamber, MD (Obstetrics and Gynecology) Heath Lark, MD as Consulting Physician (Hematology and Oncology)  DIAGNOSIS: Progressive hypogammaglobulinemia on background history of CLL  SUMMARY OF ONCOLOGIC HISTORY: She was in one of the first cohorts of patients treated with FCR on protocol at the [facility] at time of her initial diagnosis in July 2002. Technically she had RAI  stage III disease due to anemia but she had no organomegaly or lymphadenopathy on exam. FCR was started in 12/2001, and continued through 06/2002. She achieved a complete hematologic response and,which has been durable now for over 11 years!  She did develop hypogammaglobulinemia. As a result, she gets recurrent bronchitis and sinusitis. In April 2013 she was started her on monthly intravenous immunoglobulin 400 mg per kilogram and this has significantly impacted on the frequency and severity of her infections. This was continued through April of 2014. She has had no new infections since stopping the IVIG but as expected, immunoglobulin levels have fallen to pretreatment levels.   She has a history of her recurrent squamous cell carcinomas of her skin and has had 2 lesions excised from her left tibial area and another 1 excised from the vulvar area. Recently, she had a basal cell carcinoma removed. She has a history of both herpes zoster and herpes simplex infections. Recently, she had abnormal Pap smear and currently on close observation. Recent colposcopy was negative.   INTERVAL HISTORY: NEMESIS Bianca Parker 68 y.o. female returns for discussion related to an immunoglobulin infusion. Since the last time I saw her, she completed a  course of doxycycline for upper respiratory tract infection. A week ago, she developed a small blister in the left finger which improve on observation. She was prescribed antibiotics by her primary care provider but has not taken it. She denies new lymphadenopathy. She denies any recent fever, chills, night sweats or abnormal weight loss No recurrence of her cough.  I have reviewed the past medical history, past surgical history, social history and family history with the patient and they are unchanged from previous note.  ALLERGIES:  is allergic to penicillins; pentamidine; and sulfonamide derivatives.  MEDICATIONS:  Current Outpatient Prescriptions  Medication Sig Dispense Refill  . Calcium Carbonate-Vitamin D (CALTRATE 600+D) 600-400 MG-UNIT per tablet Take 1 tablet by mouth daily.      . fexofenadine (ALLEGRA) 180 MG tablet Take 180 mg by mouth daily.        . Levothyroxine Sodium (SYNTHROID PO) Take 25 mcg by mouth daily.      . Loperamide HCl (IMODIUM A-D PO) Take by mouth as needed.      . Multiple Vitamin (MULTIVITAMIN) tablet Take 1 tablet by mouth daily.      . Multiple Vitamins-Minerals (OCUVITE PO) Take by mouth daily.       No current facility-administered medications for this visit.   Facility-Administered Medications Ordered in Other Visits  Medication Dose Route Frequency Provider Last Rate Last Dose  . diphenhydrAMINE (BENADRYL) capsule 50 mg  50 mg Oral Q6H PRN Annia Belt, MD   50 mg at 09/28/12 1256    REVIEW OF SYSTEMS:   Constitutional: Denies fevers, chills or abnormal weight loss Eyes: Denies blurriness of vision Ears, nose, mouth, throat, and face: Denies  mucositis or sore throat Respiratory: Denies cough, dyspnea or wheezes Cardiovascular: Denies palpitation, chest discomfort or lower extremity swelling Gastrointestinal:  Denies nausea, heartburn or change in bowel habits Skin: Denies abnormal skin rashes Lymphatics: Denies new lymphadenopathy or  easy bruising Neurological:Denies numbness, tingling or new weaknesses Behavioral/Psych: Mood is stable, no new changes  All other systems were reviewed with the patient and are negative.  PHYSICAL EXAMINATION: ECOG PERFORMANCE STATUS: 0 - Asymptomatic  Filed Vitals:   01/09/14 1119  BP: 136/70  Pulse: 69  Temp: 98 F (36.7 C)  Resp: 20   Filed Weights   01/09/14 1119  Weight: 162 lb (73.483 kg)    GENERAL:alert, no distress and comfortable SKIN: skin color, texture, turgor are normal, no rashes or significant lesions. There is a small rash on the left second finger not suspicious for cellulitis EYES: normal, Conjunctiva are pink and non-injected, sclera clear OROPHARYNX:no exudate, no erythema and lips, buccal mucosa, and tongue normal  NECK: supple, thyroid normal size, non-tender, without nodularity LYMPH:  no palpable lymphadenopathy in the cervical, axillary or inguinal LUNGS: clear to auscultation and percussion with normal breathing effort HEART: regular rate & rhythm and no murmurs and no lower extremity edema ABDOMEN:abdomen soft, non-tender and normal bowel sounds Musculoskeletal:no cyanosis of digits and no clubbing  NEURO: alert & oriented x 3 with fluent speech, no focal motor/sensory deficits  LABORATORY DATA:  I have reviewed the data as listed    Component Value Date/Time   NA 144 01/09/2014 1109   NA 142 10/26/2012 1144   K 3.8 01/09/2014 1109   K 4.0 10/26/2012 1144   CL 109* 01/20/2013 0801   CL 107 10/26/2012 1144   CO2 24 01/09/2014 1109   CO2 24 10/26/2012 1144   GLUCOSE 103 01/09/2014 1109   GLUCOSE 89 01/20/2013 0801   GLUCOSE 90 10/26/2012 1144   BUN 17.1 01/09/2014 1109   BUN 16 10/26/2012 1144   CREATININE 1.0 01/09/2014 1109   CREATININE 1.10 10/26/2012 1144   CALCIUM 9.9 01/09/2014 1109   CALCIUM 9.3 10/26/2012 1144   PROT 6.0* 01/09/2014 1109   PROT 7.1 10/26/2012 1144   ALBUMIN 4.3 01/09/2014 1109   ALBUMIN 3.8 10/26/2012 1144   AST 38* 01/09/2014 1109   AST  49* 10/26/2012 1144   ALT 41 01/09/2014 1109   ALT 62* 10/26/2012 1144   ALKPHOS 48 01/09/2014 1109   ALKPHOS 57 10/26/2012 1144   BILITOT 0.80 01/09/2014 1109   BILITOT 0.4 10/26/2012 1144   GFRNONAA 57* 04/18/2009 0943   GFRAA  Value: >60        The eGFR has been calculated using the MDRD equation. This calculation has not been validated in all clinical situations. eGFR's persistently <60 mL/min signify possible Chronic Kidney Disease. 04/18/2009 0943    No results found for this basename: SPEP,  UPEP,   kappa and lambda light chains    Lab Results  Component Value Date   WBC 5.1 01/09/2014   NEUTROABS 3.0 01/09/2014   HGB 13.9 01/09/2014   HCT 42.8 01/09/2014   MCV 92.2 01/09/2014   PLT 133* 01/09/2014      Chemistry      Component Value Date/Time   NA 144 01/09/2014 1109   NA 142 10/26/2012 1144   K 3.8 01/09/2014 1109   K 4.0 10/26/2012 1144   CL 109* 01/20/2013 0801   CL 107 10/26/2012 1144   CO2 24 01/09/2014 1109   CO2 24 10/26/2012 1144  BUN 17.1 01/09/2014 1109   BUN 16 10/26/2012 1144   CREATININE 1.0 01/09/2014 1109   CREATININE 1.10 10/26/2012 1144   GLU 92 06/17/2013      Component Value Date/Time   CALCIUM 9.9 01/09/2014 1109   CALCIUM 9.3 10/26/2012 1144   ALKPHOS 48 01/09/2014 1109   ALKPHOS 57 10/26/2012 1144   AST 38* 01/09/2014 1109   AST 49* 10/26/2012 1144   ALT 41 01/09/2014 1109   ALT 62* 10/26/2012 1144   BILITOT 0.80 01/09/2014 1109   BILITOT 0.4 10/26/2012 1144     ASSESSMENT & PLAN:  #1 CLL CBC is normal although she does have mild thrombocytopenia. With the progression of hypogammaglobulinemia, I suspect she may have early signs of recurrence of CLL. I plan to order flow cytometry with the next visit. #2 recurrent sinus congestion/infection, resolved The patient would be at risk of lingering infection due to hypogammaglobulinemia.  #3 hypogammaglobulinemia We had a very long discussion about the risks and benefits of IVIG. The patient has not has any recent life-threatening  infection. I recommend very close observation and I plan to see her back in 2 months with history and physical examination #4  abnormal liver function tests Cause is unknown. I will recheck it but if it remains persistently elevated we might need to do CT imaging study to rule out cancer #5 abnormal Pap smear I recommend she continue with close followup with the gynecologist #6 history of skin cancer She needs to followup closely with her dermatologist due to high risk of skin cancer with CLL patients.  Orders Placed This Encounter  Procedures  . CBC with Differential    Standing Status: Future     Number of Occurrences:      Standing Expiration Date: 01/09/2015  . Comprehensive metabolic panel    Standing Status: Future     Number of Occurrences:      Standing Expiration Date: 01/09/2015  . Lactate dehydrogenase    Standing Status: Future     Number of Occurrences:      Standing Expiration Date: 01/09/2015  . IgG, IgA, IgM    Standing Status: Future     Number of Occurrences:      Standing Expiration Date: 01/09/2015  . Beta 2 microglobulin, serum    Standing Status: Future     Number of Occurrences:      Standing Expiration Date: 01/10/2015  . Flow Cytometry    Standing Status: Future     Number of Occurrences:      Standing Expiration Date: 01/09/2015   All questions were answered. The patient knows to call the clinic with any problems, questions or concerns. No barriers to learning was detected. I spent 25 minutes counseling the patient face to face. The total time spent in the appointment was 30 minutes and more than 50% was on counseling and review of test results     Heath Lark, MD 01/09/2014 12:29 PM

## 2014-01-10 ENCOUNTER — Ambulatory Visit: Payer: 59

## 2014-01-10 LAB — IGG, IGA, IGM: IGG (IMMUNOGLOBIN G), SERUM: 123 mg/dL — AB (ref 690–1700)

## 2014-01-11 ENCOUNTER — Telehealth: Payer: Self-pay | Admitting: Internal Medicine

## 2014-01-11 NOTE — Telephone Encounter (Signed)
Pt called and said as an FYI she did fill the rx Keflex

## 2014-03-06 ENCOUNTER — Other Ambulatory Visit (HOSPITAL_COMMUNITY)
Admission: RE | Admit: 2014-03-06 | Discharge: 2014-03-06 | Disposition: A | Discharge status: Home / Self Care | Payer: 59 | Source: Ambulatory Visit | Attending: Hematology and Oncology | Admitting: Hematology and Oncology

## 2014-03-06 ENCOUNTER — Other Ambulatory Visit (HOSPITAL_BASED_OUTPATIENT_CLINIC_OR_DEPARTMENT_OTHER): Payer: 59

## 2014-03-06 DIAGNOSIS — C9111 Chronic lymphocytic leukemia of B-cell type in remission: Secondary | ICD-10-CM | POA: Insufficient documentation

## 2014-03-06 DIAGNOSIS — C9110 Chronic lymphocytic leukemia of B-cell type not having achieved remission: Secondary | ICD-10-CM

## 2014-03-06 LAB — COMPREHENSIVE METABOLIC PANEL (CC13)
ALK PHOS: 66 U/L (ref 40–150)
ALT: 38 U/L (ref 0–55)
AST: 28 U/L (ref 5–34)
Albumin: 3.9 g/dL (ref 3.5–5.0)
Anion Gap: 10 mEq/L (ref 3–11)
BUN: 17.8 mg/dL (ref 7.0–26.0)
CO2: 26 mEq/L (ref 22–29)
Calcium: 9.6 mg/dL (ref 8.4–10.4)
Chloride: 110 mEq/L — ABNORMAL HIGH (ref 98–109)
Creatinine: 0.8 mg/dL (ref 0.6–1.1)
Glucose: 83 mg/dl (ref 70–140)
POTASSIUM: 3.6 meq/L (ref 3.5–5.1)
SODIUM: 145 meq/L (ref 136–145)
Total Bilirubin: 0.56 mg/dL (ref 0.20–1.20)
Total Protein: 5.8 g/dL — ABNORMAL LOW (ref 6.4–8.3)

## 2014-03-06 LAB — CBC WITH DIFFERENTIAL/PLATELET
BASO%: 0.1 % (ref 0.0–2.0)
Basophils Absolute: 0 10*3/uL (ref 0.0–0.1)
EOS%: 1.5 % (ref 0.0–7.0)
Eosinophils Absolute: 0.1 10*3/uL (ref 0.0–0.5)
HCT: 43 % (ref 34.8–46.6)
HGB: 13.9 g/dL (ref 11.6–15.9)
LYMPH%: 28.1 % (ref 14.0–49.7)
MCH: 30.4 pg (ref 25.1–34.0)
MCHC: 32.3 g/dL (ref 31.5–36.0)
MCV: 94.2 fL (ref 79.5–101.0)
MONO#: 0.4 10*3/uL (ref 0.1–0.9)
MONO%: 7.6 % (ref 0.0–14.0)
NEUT#: 3.5 10*3/uL (ref 1.5–6.5)
NEUT%: 62.7 % (ref 38.4–76.8)
Platelets: 140 10*3/uL — ABNORMAL LOW (ref 145–400)
RBC: 4.56 10*6/uL (ref 3.70–5.45)
RDW: 15.1 % — AB (ref 11.2–14.5)
WBC: 5.5 10*3/uL (ref 3.9–10.3)
lymph#: 1.6 10*3/uL (ref 0.9–3.3)

## 2014-03-06 LAB — LACTATE DEHYDROGENASE (CC13): LDH: 190 U/L (ref 125–245)

## 2014-03-08 LAB — BETA 2 MICROGLOBULIN, SERUM: Beta-2 Microglobulin: 2.86 mg/L — ABNORMAL HIGH (ref <=2.51)

## 2014-03-08 LAB — FLOW CYTOMETRY

## 2014-03-08 LAB — IGG, IGA, IGM
IgA: <7 mg/dL — ABNORMAL LOW (ref 69–380)
IgG (Immunoglobin G), Serum: 99 mg/dL — ABNORMAL LOW (ref 690–1700)
IgM, Serum: <5 mg/dL — ABNORMAL LOW (ref 52–322)

## 2014-03-13 ENCOUNTER — Encounter: Payer: Self-pay | Admitting: Hematology and Oncology

## 2014-03-13 ENCOUNTER — Ambulatory Visit (HOSPITAL_BASED_OUTPATIENT_CLINIC_OR_DEPARTMENT_OTHER): Payer: 59 | Admitting: Hematology and Oncology

## 2014-03-13 VITALS — BP 120/66 | HR 81 | Temp 97.9°F | Resp 18 | Ht 64.5 in | Wt 151.6 lb

## 2014-03-13 DIAGNOSIS — C9111 Chronic lymphocytic leukemia of B-cell type in remission: Secondary | ICD-10-CM

## 2014-03-13 DIAGNOSIS — D839 Common variable immunodeficiency, unspecified: Secondary | ICD-10-CM

## 2014-03-13 DIAGNOSIS — R945 Abnormal results of liver function studies: Secondary | ICD-10-CM

## 2014-03-13 DIAGNOSIS — D801 Nonfamilial hypogammaglobulinemia: Secondary | ICD-10-CM

## 2014-03-13 DIAGNOSIS — R7989 Other specified abnormal findings of blood chemistry: Secondary | ICD-10-CM

## 2014-03-13 MED ORDER — VALACYCLOVIR HCL 500 MG PO TABS: 500.0000 mg | ORAL_TABLET | Freq: Every day | ORAL | Status: DC

## 2014-03-13 NOTE — Progress Notes (Signed)
[facility] PROGRESS [facility]  Patient Care Team: Valerie A Leschber, MD as PCP - General Clinton D Young, MD as Referring Physician (Pulmonary Disease) Christopher E Newman, MD as Referring Physician (Otolaryngology) Daniel P Jacobs, MD (Gastroenterology) John S McComb, MD (Obstetrics and Gynecology) Ni Gorsuch, MD as Consulting Physician (Hematology and Oncology) Karen Gould, MD as Consulting Physician (Dermatology)  SUMMARY OF ONCOLOGIC HISTORY: She was in one of the first cohorts of patients treated with FCR on protocol at the [facility] at time of her initial diagnosis in July 2002. Technically she had RAI  stage III disease due to anemia but she had no organomegaly or lymphadenopathy on exam. FCR was started in 12/2001, and continued through 06/2002. She achieved a complete hematologic response and,which has been durable now for over 11 years!  She did develop hypogammaglobulinemia. As a result, she gets recurrent bronchitis and sinusitis. In April 2013 she was started her on monthly intravenous immunoglobulin 400 mg per kilogram and this has significantly impacted on the frequency and severity of her infections. This was continued through April of 2014. She has had no new infections since stopping the IVIG but as expected, immunoglobulin levels have fallen to pretreatment levels.   She has a history of her recurrent squamous cell carcinomas of her skin and has had 2 lesions excised from her left tibial area and another 1 excised from the vulvar area. Recently, she had a basal cell carcinoma removed. She has a history of both herpes zoster and herpes simplex infections. Recently, she had abnormal Pap smear and currently on close observation. Recent colposcopy was negative.   INTERVAL HISTORY: Please see below for problem oriented charting. She returns as part of CLL followup. She had another episode of herpes infection around the anus region, resolved with  Valtrex She denies new lymphadenopathy. Denies recent bacteria infection. She saw her surgeon recently for new subcutaneous lesion on the left flank area without pain. She is attempting healthy diet and have intentional weight loss of 10 pounds over the last 2 months. She denies new skin cancer.  REVIEW OF SYSTEMS:   Constitutional: Denies fevers, chills or abnormal weight loss Eyes: Denies blurriness of vision Ears, nose, mouth, throat, and face: Denies mucositis or sore throat Respiratory: Denies cough, dyspnea or wheezes Cardiovascular: Denies palpitation, chest discomfort or lower extremity swelling Gastrointestinal:  Denies nausea, heartburn or change in bowel habits Skin: Denies abnormal skin rashes Lymphatics: Denies new lymphadenopathy or easy bruising Neurological:Denies numbness, tingling or new weaknesses Behavioral/Psych: Mood is stable, no new changes  All other systems were reviewed with the patient and are negative.  I have reviewed the past medical history, past surgical history, social history and family history with the patient and they are unchanged from previous note.  ALLERGIES:  is allergic to penicillins; pentamidine; and sulfonamide derivatives.  MEDICATIONS:  Current Outpatient Prescriptions  Medication Sig Dispense Refill  . Calcium Carbonate-Vitamin D (CALTRATE 600+D) 600-400 MG-UNIT per tablet Take 1 tablet by mouth daily.      . fexofenadine (ALLEGRA) 180 MG tablet Take 180 mg by mouth daily.        . Levothyroxine Sodium (SYNTHROID PO) Take 25 mcg by mouth daily.      . Loperamide HCl (IMODIUM A-D PO) Take by mouth as needed.      . Multiple Vitamin (MULTIVITAMIN) tablet Take 1 tablet by mouth daily.      . Multiple Vitamins-Minerals (OCUVITE PO) Take by mouth daily.      .   valACYclovir (VALTREX) 500 MG tablet Take 1 tablet (500 mg total) by mouth daily.  90 tablet  3   No current facility-administered medications for this visit.    Facility-Administered Medications Ordered in Other Visits  Medication Dose Route Frequency Provider Last Rate Last Dose  . diphenhydrAMINE (BENADRYL) capsule 50 mg  50 mg Oral Q6H PRN James M Granfortuna, MD   50 mg at 09/28/12 1256    PHYSICAL EXAMINATION: ECOG PERFORMANCE STATUS: 0 - Asymptomatic  Filed Vitals:   03/13/14 0850  BP: 120/66  Pulse: 81  Temp: 97.9 F (36.6 C)  Resp: 18   Filed Weights   03/13/14 0850  Weight: 151 lb 9.6 oz (68.765 kg)    GENERAL:alert, no distress and comfortable SKIN: skin color, texture, turgor are normal, no rashes or significant lesions. There is a palpable cyst on the left flank which does not resemble cancer. EYES: normal, Conjunctiva are pink and non-injected, sclera clear OROPHARYNX:no exudate, no erythema and lips, buccal mucosa, and tongue normal  NECK: supple, thyroid normal size, non-tender, without nodularity LYMPH:  no palpable lymphadenopathy in the cervical, axillary or inguinal LUNGS: clear to auscultation and percussion with normal breathing effort HEART: regular rate & rhythm and no murmurs and no lower extremity edema ABDOMEN:abdomen soft, non-tender and normal bowel sounds Musculoskeletal:no cyanosis of digits and no clubbing  NEURO: alert & oriented x 3 with fluent speech, no focal motor/sensory deficits  LABORATORY DATA:  I have reviewed the data as listed    Component Value Date/Time   NA 145 03/06/2014 1133   NA 142 10/26/2012 1144   K 3.6 03/06/2014 1133   K 4.0 10/26/2012 1144   CL 109* 01/20/2013 0801   CL 107 10/26/2012 1144   CO2 26 03/06/2014 1133   CO2 24 10/26/2012 1144   GLUCOSE 83 03/06/2014 1133   GLUCOSE 89 01/20/2013 0801   GLUCOSE 90 10/26/2012 1144   BUN 17.8 03/06/2014 1133   BUN 16 10/26/2012 1144   CREATININE 0.8 03/06/2014 1133   CREATININE 1.10 10/26/2012 1144   CALCIUM 9.6 03/06/2014 1133   CALCIUM 9.3 10/26/2012 1144   PROT 5.8* 03/06/2014 1133   PROT 7.1 10/26/2012 1144   ALBUMIN 3.9 03/06/2014 1133    ALBUMIN 3.8 10/26/2012 1144   AST 28 03/06/2014 1133   AST 49* 10/26/2012 1144   ALT 38 03/06/2014 1133   ALT 62* 10/26/2012 1144   ALKPHOS 66 03/06/2014 1133   ALKPHOS 57 10/26/2012 1144   BILITOT 0.56 03/06/2014 1133   BILITOT 0.4 10/26/2012 1144   GFRNONAA 57* 04/18/2009 0943   GFRAA  Value: >60        The eGFR has been calculated using the MDRD equation. This calculation has not been validated in all clinical situations. eGFR's persistently <60 mL/min signify possible Chronic Kidney Disease. 04/18/2009 0943    No results found for this basename: SPEP,  UPEP,   kappa and lambda light chains    Lab Results  Component Value Date   WBC 5.5 03/06/2014   NEUTROABS 3.5 03/06/2014   HGB 13.9 03/06/2014   HCT 43.0 03/06/2014   MCV 94.2 03/06/2014   PLT 140* 03/06/2014      Chemistry      Component Value Date/Time   NA 145 03/06/2014 1133   NA 142 10/26/2012 1144   K 3.6 03/06/2014 1133   K 4.0 10/26/2012 1144   CL 109* 01/20/2013 0801   CL 107 10/26/2012 1144   CO2 26 03/06/2014 1133     CO2 24 10/26/2012 1144   BUN 17.8 03/06/2014 1133   BUN 16 10/26/2012 1144   CREATININE 0.8 03/06/2014 1133   CREATININE 1.10 10/26/2012 1144   GLU 92 06/17/2013      Component Value Date/Time   CALCIUM 9.6 03/06/2014 1133   CALCIUM 9.3 10/26/2012 1144   ALKPHOS 66 03/06/2014 1133   ALKPHOS 57 10/26/2012 1144   AST 28 03/06/2014 1133   AST 49* 10/26/2012 1144   ALT 38 03/06/2014 1133   ALT 62* 10/26/2012 1144   BILITOT 0.56 03/06/2014 1133   BILITOT 0.4 10/26/2012 1144      ASSESSMENT & PLAN:  Chronic lymphoid leukemia in remission Flow cytometry did not reveal any abnormal clones. I recommend yearly visit with history, physical examination and blood work.  Hypogammaglobulinemia, acquired This is related to long-term side effects from prior chemotherapy. She has recurrent infection with herpesvirus. I recommend chronic treatment with Valtrex daily. The patient had infusion reaction with sick serum sickness with IVIG infusion. I  will reserve the use of IVIG to life-threatening situation only.  LFT elevation This has resolved with weight loss. I congratulated her efforts.    Orders Placed This Encounter  Procedures  . CBC with Differential    Standing Status: Future     Number of Occurrences:      Standing Expiration Date: 03/13/2015  . Comprehensive metabolic panel    Standing Status: Future     Number of Occurrences:      Standing Expiration Date: 03/13/2015  . Lactate dehydrogenase    Standing Status: Future     Number of Occurrences:      Standing Expiration Date: 03/13/2015  . IgG, IgA, IgM    Standing Status: Future     Number of Occurrences:      Standing Expiration Date: 03/13/2015   All questions were answered. The patient knows to call the clinic with any problems, questions or concerns. No barriers to learning was detected. I spent 25 minutes counseling the patient face to face. The total time spent in the appointment was 30 minutes and more than 50% was on counseling and review of test results     [facility], [place], MD 03/13/2014 10:17 AM

## 2014-03-13 NOTE — Assessment & Plan Note (Signed)
Flow cytometry did not reveal any abnormal clones. I recommend yearly visit with history, physical examination and blood work.

## 2014-03-13 NOTE — Assessment & Plan Note (Signed)
This is related to long-term side effects from prior chemotherapy. She has recurrent infection with herpesvirus. I recommend chronic treatment with Valtrex daily. The patient had infusion reaction with sick serum sickness with IVIG infusion. I will reserve the use of IVIG to life-threatening situation only.

## 2014-03-13 NOTE — Assessment & Plan Note (Signed)
This has resolved with weight loss. I congratulated her efforts.

## 2014-03-14 ENCOUNTER — Telehealth: Payer: Self-pay | Admitting: Hematology and Oncology

## 2014-03-14 NOTE — Telephone Encounter (Signed)
S/w pt gave appt for July 2016. Pt verbalized some concern with "once coming very ofter, sometimes 3 times a week for labs to returning in 1 yr" but says she will call us if she feels she needs to come in sooner. also mailed appt calendar.

## 2014-04-26 ENCOUNTER — Encounter: Payer: Self-pay | Admitting: Internal Medicine

## 2014-04-26 DIAGNOSIS — C9110 Chronic lymphocytic leukemia of B-cell type not having achieved remission: Secondary | ICD-10-CM

## 2014-04-26 DIAGNOSIS — E785 Hyperlipidemia, unspecified: Secondary | ICD-10-CM

## 2014-05-15 ENCOUNTER — Other Ambulatory Visit: Payer: Self-pay

## 2014-05-15 DIAGNOSIS — Z1231 Encounter for screening mammogram for malignant neoplasm of breast: Secondary | ICD-10-CM

## 2014-05-22 ENCOUNTER — Other Ambulatory Visit (INDEPENDENT_AMBULATORY_CARE_PROVIDER_SITE_OTHER): Payer: 59

## 2014-05-22 DIAGNOSIS — E785 Hyperlipidemia, unspecified: Secondary | ICD-10-CM

## 2014-05-22 DIAGNOSIS — C9110 Chronic lymphocytic leukemia of B-cell type not having achieved remission: Secondary | ICD-10-CM

## 2014-05-22 LAB — HEPATIC FUNCTION PANEL
ALT: 40 U/L — ABNORMAL HIGH (ref 0–35)
AST: 33 U/L (ref 0–37)
Albumin: 4.4 g/dL (ref 3.5–5.2)
Alkaline Phosphatase: 40 U/L (ref 39–117)
BILIRUBIN DIRECT: 0.1 mg/dL (ref 0.0–0.3)
BILIRUBIN TOTAL: 0.9 mg/dL (ref 0.2–1.2)
Total Protein: 6.1 g/dL (ref 6.0–8.3)

## 2014-05-22 LAB — BASIC METABOLIC PANEL
BUN: 19 mg/dL (ref 6–23)
CO2: 30 mEq/L (ref 19–32)
CREATININE: 1 mg/dL (ref 0.4–1.2)
Calcium: 9.5 mg/dL (ref 8.4–10.5)
Chloride: 104 mEq/L (ref 96–112)
GFR: 60.75 mL/min (ref >60.00)
Glucose, Bld: 80 mg/dL (ref 70–99)
Potassium: 4.3 mEq/L (ref 3.5–5.1)
Sodium: 141 mEq/L (ref 135–145)

## 2014-05-22 LAB — CBC WITH DIFFERENTIAL/PLATELET
BASOS ABS: 0 10*3/uL (ref 0.0–0.1)
Basophils Relative: 0.3 % (ref 0.0–3.0)
Eosinophils Absolute: 0.1 10*3/uL (ref 0.0–0.7)
Eosinophils Relative: 1.1 % (ref 0.0–5.0)
HCT: 44.5 % (ref 36.0–46.0)
Hemoglobin: 14.8 g/dL (ref 12.0–15.0)
Lymphocytes Relative: 31.2 % (ref 12.0–46.0)
Lymphs Abs: 1.5 10*3/uL (ref 0.7–4.0)
MCHC: 33.2 g/dL (ref 30.0–36.0)
MCV: 93.6 fl (ref 78.0–100.0)
MONO ABS: 0.4 10*3/uL (ref 0.1–1.0)
Monocytes Relative: 8 % (ref 3.0–12.0)
NEUTROS PCT: 59.4 % (ref 43.0–77.0)
Neutro Abs: 2.9 10*3/uL (ref 1.4–7.7)
Platelets: 135 10*3/uL — ABNORMAL LOW (ref 150.0–400.0)
RBC: 4.76 Mil/uL (ref 3.87–5.11)
RDW: 14 % (ref 11.5–15.5)
WBC: 4.9 10*3/uL (ref 4.0–10.5)

## 2014-05-22 LAB — LIPID PANEL
CHOL/HDL RATIO: 4
CHOLESTEROL: 223 mg/dL — AB (ref 0–200)
HDL: 58 mg/dL (ref >39.00)
LDL CALC: 144 mg/dL — AB (ref 0–99)
NonHDL: 165
TRIGLYCERIDES: 105 mg/dL (ref 0.0–149.0)
VLDL: 21 mg/dL (ref 0.0–40.0)

## 2014-05-25 ENCOUNTER — Telehealth: Payer: Self-pay | Admitting: *Deleted

## 2014-05-25 NOTE — Telephone Encounter (Signed)
Pt requests her Flow Cytometry and Immunoglobulins be faxed to [facility] at fax [phone], attention to Shonna Chock, NP..   Labs faxed as requested.

## 2014-05-29 ENCOUNTER — Encounter: Payer: 59 | Admitting: Internal Medicine

## 2014-05-29 ENCOUNTER — Encounter: Payer: Self-pay | Admitting: Internal Medicine

## 2014-05-30 ENCOUNTER — Telehealth: Payer: Self-pay | Admitting: *Deleted

## 2014-05-30 NOTE — Telephone Encounter (Signed)
Patient was called to reschedule appointment.  She will reschedule her appointment for sometime later.  She asked if Dr. Asa Lente would call and talk with her about her care relating to her leukemia.

## 2014-05-30 NOTE — Telephone Encounter (Signed)
Pt left VM states she has spoken w/ Dr. Holley Dexter at [facility] about her low immunoglobulins.  She was told she does not fit any criteria for any specific protocol for IVIG.  She would not recommend pt get IVIG unless she was getting sick,  Which she is not.  Pt says she thinks this is what Dr. Alvy Bimler also recommended.  She just wanted Dr. Alvy Bimler to know.  Pt says she does not need to return to [facility] anytime soon for follow up.

## 2014-05-31 ENCOUNTER — Ambulatory Visit: Payer: 59 | Admitting: Internal Medicine

## 2014-05-31 DIAGNOSIS — R4689 Other symptoms and signs involving appearance and behavior: Secondary | ICD-10-CM | POA: Insufficient documentation

## 2014-06-06 ENCOUNTER — Ambulatory Visit: Payer: 59 | Admitting: Hematology and Oncology

## 2014-06-06 ENCOUNTER — Other Ambulatory Visit: Payer: 59

## 2014-06-07 ENCOUNTER — Ambulatory Visit (INDEPENDENT_AMBULATORY_CARE_PROVIDER_SITE_OTHER): Payer: 59 | Admitting: Internal Medicine

## 2014-06-07 ENCOUNTER — Encounter: Payer: Self-pay | Admitting: Internal Medicine

## 2014-06-07 VITALS — BP 108/72 | HR 74 | Temp 98.1°F | Resp 12 | Wt 141.4 lb

## 2014-06-07 DIAGNOSIS — J309 Allergic rhinitis, unspecified: Secondary | ICD-10-CM

## 2014-06-07 DIAGNOSIS — J302 Other seasonal allergic rhinitis: Secondary | ICD-10-CM

## 2014-06-07 DIAGNOSIS — J3089 Other allergic rhinitis: Secondary | ICD-10-CM

## 2014-06-07 DIAGNOSIS — C9111 Chronic lymphocytic leukemia of B-cell type in remission: Secondary | ICD-10-CM

## 2014-06-07 DIAGNOSIS — E785 Hyperlipidemia, unspecified: Secondary | ICD-10-CM

## 2014-06-07 NOTE — Patient Instructions (Signed)

## 2014-06-07 NOTE — Assessment & Plan Note (Signed)
NMR Lipoprofile to optimally assess risk

## 2014-06-07 NOTE — Progress Notes (Signed)
   Subjective:    Patient ID: Bianca Parker, female    DOB: 1946-01-01, 68 y.o.   MRN: [id number]  HPI   She is here with multiple concerns. She has CLL in remission ,but concerned about her immune system. She does have recurrent upper respiratory infections but this is in the setting of seasonal and perennial rhinitis. She does see Dr. Annamaria Boots, Allergist. She has had iatrogenic perforation (? Otic tube) of the tympanic membrane to relieve pressure because of recurrent middle ear infections.  She has aggressively addressed the risk factors with a heart healthy diet and high level cardiovascular exercise. Over the last year she's had a purposeful 75-80 pound weight loss  She's never smoked.  Family history is negative for heart attack or stroke.  Her LDL had previously been over 175; at this time her LDL is 144, HDL 58, and triglycerides 105 as of 05/22/14. She has a history of statin intolerance.Specifically she had elevated liver function test  On 9/28 her ALT was 40 and AST 33.  She rarely drinks alcohol ,less than twice a year. She does not ingest excess vitamin A. She does not take excessive Tylenol   Review of Systems  She does have some lightheadedness and is concerned about her blood pressure being "too low". She has some lightheadedness with flexion at the waist or head rotation. There is no postural component  She is a glaucoma suspect as well as for possible pre-macular degeneration  Previously she did have a history of elevated blood pressure without hypertension       Objective:   Physical Exam  Positive or pertinent findings include: The left tympanic membrane is dull. There is scarring of the right tympanic membrane inferiorly. Accentuation of the thoracic curvature superiorly. The aorta is palpable ;there is no aneurysm.  Appears healthy and well-nourished & in no acute distress No carotid bruits are present.No neck vein distention present at 10 - 15 degrees.  Thyroid normal to palpation Heart rhythm and rate are normal with no gallop or murmur Chest is clear with no increased work of breathing There is no evidence of  renal artery bruits Abdomen soft with no organomegaly or masses. No HJR No clubbing, cyanosis or edema present. Pedal pulses are intact  No ischemic skin changes are present . Fingernails healthy  Alert and oriented. Strength, tone normal          Assessment & Plan:  See Current Assessment & Plan in Problem List under specific Diagnosis

## 2014-06-07 NOTE — Assessment & Plan Note (Signed)
Nasal hygiene regimen

## 2014-06-07 NOTE — Progress Notes (Signed)
Pre visit review using our clinic review tool, if applicable. No additional management support is needed unless otherwise documented below in the visit note. 

## 2014-06-14 ENCOUNTER — Ambulatory Visit (INDEPENDENT_AMBULATORY_CARE_PROVIDER_SITE_OTHER): Payer: 59 | Admitting: Internal Medicine

## 2014-06-14 ENCOUNTER — Other Ambulatory Visit: Payer: 59

## 2014-06-14 ENCOUNTER — Encounter: Payer: Self-pay | Admitting: Internal Medicine

## 2014-06-14 ENCOUNTER — Other Ambulatory Visit: Payer: Self-pay | Admitting: Internal Medicine

## 2014-06-14 VITALS — BP 112/60 | HR 72 | Ht 64.5 in | Wt 142.0 lb

## 2014-06-14 DIAGNOSIS — E785 Hyperlipidemia, unspecified: Secondary | ICD-10-CM

## 2014-06-14 DIAGNOSIS — D801 Nonfamilial hypogammaglobulinemia: Secondary | ICD-10-CM

## 2014-06-14 DIAGNOSIS — D839 Common variable immunodeficiency, unspecified: Secondary | ICD-10-CM

## 2014-06-14 DIAGNOSIS — J3089 Other allergic rhinitis: Secondary | ICD-10-CM

## 2014-06-14 DIAGNOSIS — J302 Other seasonal allergic rhinitis: Secondary | ICD-10-CM

## 2014-06-14 DIAGNOSIS — C9111 Chronic lymphocytic leukemia of B-cell type in remission: Secondary | ICD-10-CM

## 2014-06-14 DIAGNOSIS — J309 Allergic rhinitis, unspecified: Secondary | ICD-10-CM

## 2014-06-14 NOTE — Progress Notes (Signed)
Subjective:    Patient ID: Bianca Parker, female    DOB: 07-27-1946, 68 y.o.   MRN: [id number]  HPI 5/2HPI  68 yoF followed for Allergic rhinitis, chronic rhinosinusitis. Hx chemoRx for CLL in remission.  Last here- March 9. She responded to antibiotic, but then got another cold clearing more quickly. Saw Dr Lucia Gaskins- recognized "congestion" left nostril.  Denies infection now. Catches colds very quickly from her grandchild.  Coming today off antihistamines for allergy testing.  Demonstrated immunoglobulin deficiency as of 6/ 2011, residual from her chemotherapy of 8-9 years ago, reviewed.  She has encasings and HEPA filter.  Allergy profile- 11/01/10- IgE < 1.5; no specific elevations. Also low IgG for penicillin  Skin testing- weak positive intradermal reactions to common inhalants.   02/24/11- 39 yoF followed for Allergic rhinitis, chronic rhinosinusitis. Hx chemoRx for CLL in remission Since last here she has done fairly well with fexofenadine. She has had one "sinus infection" since last here. We talked again about non-bacterial sinusitis. She emphases that she gets fever, bed-ridden malaise. Very concerned about her host resistance because of her leukemia hx. We discussed how to culture, risks of allergy and resistance with frequent antibiotics.  Today she feels well- 2 months without an infection. Realizes risks with exposure to her gransdson's virus colds.   07/01/11-  68 yoF followed for Allergic rhinitis, chronic rhinosinusitis. Hx chemoRx for CLL in remission/ Dr Beryle Beams Has had flu vaccine. A cold  has spread to her family. She started herself with saline nasal rinse using boiled well water. Got Zicam lozenges and thinks she fought off the cold. Takes probiotics routinely. Her CLL may not be in remission now. Neutrophils and platelets are down. 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  07/07/12- 68 yoF followed for Allergic rhinitis, chronic rhinosinusitis.  Hx chemoRx for CLL in remission/ Dr Beryle Beams, acquired hypogammaglobulinemia Recently having ? bronchitis flare-was put on Pred pak from PCP. No sinus congestion or sinus trouble. Somewhat better but not completly gone. She chronic bronchitis 3 weeks ago from her husband- prednisone taper from [place]. Did shake off earlier cold. Was treated with Avelox in September. Now scant white phlegm or pale green. Some rattle. No fever or chills. Mucinex DM helps. Dr Beryle Beams heme-onc treating acquired hypogammaglobulinemia with IVIG. Her CLL remains in remission.  02/15/13- 68 yoF followed for Allergic rhinitis, chronic rhinosinusitis. Hx chemoRx for CLL in remission/ Dr Beryle Beams, acquired hypogammaglobulinemia ACUTE VISIT: recently having flare ups; allergy flare ups as well. Stayed congested and stuffy -almost like a cold. Blames spring allergy season. Home has a new heating system and duct. Feeling better with summer weather. Benadryl and Allegra daily. Flonase raised glaucoma pressure. Does Neti pot with 1-2 drops of Afrin.  07/05/13- 68 yoF followed for Allergic rhinitis, chronic rhinosinusitis. Hx chemoRx for CLL in remission/ Dr Beryle Beams, acquired hypogammaglobulinemia FOLLOWS FOR: 5 month follow up.  does report some head congestion with clear mucus, some PND, some prod cough x3 weeks.  denies wheezing, increased SOB, f/c/s. Off IVIG. Fought off a cold without antibiotics. Diagnosed borderline glaucoma. Uses saline nasal rinse if needed.  06/14/14-  68 yoF followed for Allergic rhinitis, chronic rhinosinusitis. Hx chemoRx for CLL in remission, acquired hypogammaglobulinemia FOLLOWS FOR: Allergies are kicking in more this week. Should she change her meds. Immunoglobulin deficiency. Oncologist considered IVIG but patient has felt very well and wanted to wait. She emphasizes healthy life style and only needed antibiotic once since last visit., Hiking. Takes  Zicam at onset of colds. IVIG  only is sick in hospital. She sought opinion from [facility] . Noting only minor head congestion, nothing discolored.   Review of Systems- see HPI Constitutional:   No-   weight loss, night sweats, fevers, chills, fatigue, lassitude. HEENT:   No-  headaches, difficulty swallowing, tooth/dental problems, sore throat,       No-  sneezing, itching, ear ache, nasal congestion, post nasal drip,  CV:  No-   chest pain, orthopnea, PND, swelling in lower extremities, anasarca, dizziness, palpitations Resp: No-   shortness of breath with exertion or at rest.              No-  productive cough,   non-productive cough,  No- coughing up of blood.              No-change in color of mucus.  No- wheezing.   Skin: No-   rash or lesions. GI:  No-   heartburn, indigestion, abdominal pain, nausea, vomiting,  GU: . MS:  No-   joint pain or swelling. Neuro-     nothing unusual Psych:  No- change in mood or affect. No depression or anxiety.  No memory loss.  Objective:   Physical Exam General- Alert, Oriented, Affect-appropriate, Distress- none acute; well appearing, medium                       Build. Looks well.  Skin- rash-none, lesions- none, excoriation- none Lymphadenopathy- none Head- atraumatic            Eyes- Gross vision intact, PERRLA, conjunctivae clear secretions            Ears- Hearing, canals-normal. Sclerosis of TMs            Nose- +turbinate edema, no-Septal dev, mucus, polyps, erosion, perforation             Throat- Mallampati II , mucosa clear , drainage- none, tonsils- atrophic Neck- flexible , trachea midline, no stridor , thyroid nl, carotid no bruit Chest - symmetrical excursion , unlabored           Heart/CV- RRR , no murmur , no gallop  , no rub, nl s1 s2                           - JVD- none , edema- none, stasis changes- none, varices- none           Lung- clear to P&A, wheeze- none, cough-none , dullness-none, rub- none           Chest wall-  Abd-  Br/ Gen/ Rectal- Not  done, not indicated Extrem- cyanosis- none, clubbing, none, atrophy- none, strength- nl Neuro- grossly intact to observation

## 2014-06-14 NOTE — Assessment & Plan Note (Signed)
Managed with otc antihistamine if needed

## 2014-06-14 NOTE — Assessment & Plan Note (Signed)
Managed by oncology

## 2014-06-14 NOTE — Patient Instructions (Signed)
Ok to continue as you are doing and to call as needed

## 2014-06-14 NOTE — Assessment & Plan Note (Signed)
Managed by oncology Plan- Consider pneumococcal antibody level determination to decide if helpful to repeat pneumococcal vaccine

## 2014-06-16 LAB — NMR LIPOPROFILE WITH LIPIDS
Cholesterol, Total: 229 mg/dL — ABNORMAL HIGH (ref 100–199)
HDL Particle Number: 35.2 umol/L (ref >=30.5)
HDL SIZE: 9.5 nm (ref >=9.2)
HDL-C: 73 mg/dL (ref >39)
LARGE HDL: 11.9 umol/L (ref >=4.8)
LDL (calc): 137 mg/dL — ABNORMAL HIGH (ref 0–99)
LDL Particle Number: 1698 nmol/L — ABNORMAL HIGH (ref <1000)
LDL Size: 21.5 nm (ref >=20.8)
LP-IR Score: <25 (ref <=45)
Large VLDL-P: 1.5 nmol/L (ref <=2.7)
Small LDL Particle Number: 470 nmol/L (ref <=527)
Triglycerides: 95 mg/dL (ref 0–149)
VLDL Size: 37.5 nm (ref <=46.6)

## 2014-06-18 ENCOUNTER — Encounter: Payer: Self-pay | Admitting: Internal Medicine

## 2014-06-19 ENCOUNTER — Ambulatory Visit
Admission: RE | Admit: 2014-06-19 | Discharge: 2014-06-19 | Disposition: A | Discharge status: Home / Self Care | Payer: 59 | Source: Ambulatory Visit

## 2014-06-19 DIAGNOSIS — Z1231 Encounter for screening mammogram for malignant neoplasm of breast: Secondary | ICD-10-CM

## 2014-06-21 ENCOUNTER — Other Ambulatory Visit: Payer: Self-pay | Admitting: Obstetrics and Gynecology

## 2014-06-22 ENCOUNTER — Other Ambulatory Visit (HOSPITAL_COMMUNITY): Payer: Self-pay | Admitting: Obstetrics and Gynecology

## 2014-06-22 DIAGNOSIS — E041 Nontoxic single thyroid nodule: Secondary | ICD-10-CM

## 2014-06-22 LAB — CYTOLOGY - PAP

## 2014-06-26 ENCOUNTER — Ambulatory Visit (HOSPITAL_COMMUNITY)
Admission: RE | Admit: 2014-06-26 | Discharge: 2014-06-26 | Disposition: A | Discharge status: Home / Self Care | Payer: 59 | Source: Ambulatory Visit | Attending: Obstetrics and Gynecology | Admitting: Obstetrics and Gynecology

## 2014-06-26 DIAGNOSIS — E041 Nontoxic single thyroid nodule: Secondary | ICD-10-CM | POA: Insufficient documentation

## 2014-06-27 ENCOUNTER — Other Ambulatory Visit: Payer: Self-pay | Admitting: Obstetrics and Gynecology

## 2014-08-09 ENCOUNTER — Telehealth: Payer: Self-pay | Admitting: Internal Medicine

## 2014-08-09 MED ORDER — AZITHROMYCIN 250 MG PO TABS: 250.0000 mg | ORAL_TABLET | ORAL | Status: DC

## 2014-08-09 NOTE — Telephone Encounter (Signed)
Last OV 06-14-14. Pt is c/o having sinus congestion, pain and pressure of sinuses, green mucus from nose, chest congestion x 3 days. She denies any cough, fever at this time. She states her grandson has been sick and she feels that is where she got this from. Pt is requesting an abx. Please advise. [place], CMA Allergies  Allergen Reactions  . Penicillins   . Pentamidine   . Sulfonamide Derivatives    Current Outpatient Prescriptions on File Prior to Visit  Medication Sig Dispense Refill  . Calcium Carbonate-Vitamin D (CALTRATE 600+D) 600-400 MG-UNIT per tablet Take 1 tablet by mouth daily.    . fexofenadine (ALLEGRA) 180 MG tablet Take 180 mg by mouth daily.      . Levothyroxine Sodium (SYNTHROID PO) Take 25 mcg by mouth daily.    . Multiple Vitamin (MULTIVITAMIN) tablet Take 1 tablet by mouth daily.    . Multiple Vitamins-Minerals (OCUVITE PO) Take by mouth. Every week(rare use)    . valACYclovir (VALTREX) 500 MG tablet Take 500 mg by mouth as needed.     Current Facility-Administered Medications on File Prior to Visit  Medication Dose Route Frequency Provider Last Rate Last Dose  . diphenhydrAMINE (BENADRYL) capsule 50 mg  50 mg Oral Q6H PRN Annia Belt, MD   50 mg at 09/28/12 1256

## 2014-08-09 NOTE — Telephone Encounter (Signed)
Offer Zpak 

## 2014-08-09 NOTE — Telephone Encounter (Signed)
Pt aware that ZPAK has been sent to pharmacy requested - [place] Nothing further needed.

## 2014-08-23 ENCOUNTER — Telehealth: Payer: Self-pay | Admitting: Internal Medicine

## 2014-08-23 MED ORDER — DOXYCYCLINE HYCLATE 100 MG PO TABS: ORAL_TABLET | ORAL | Status: DC

## 2014-08-23 NOTE — Telephone Encounter (Signed)
Called and spoke with pt and she is aware of CY recs and that this medication has been sent to the pharmacy.  Nothing further is needed.

## 2014-08-23 NOTE — Telephone Encounter (Signed)
Pt c/o cough with Mylin Gignac mucus, head pressure, right ear pain, right throat pain with PND and right eye infection(treated with Vigamox eye drop). Pt denies fever and SOB. Pt requesting abx be called into [facility], requests anything but ZPAK.  Allergies  Allergen Reactions  . Penicillins   . Pentamidine   . Sulfonamide Derivatives    Please advise Dr Annamaria Boots. Thanks.    Medication List       This list is accurate as of: 08/23/14 11:30 AM.  Always use your most recent med list.               azithromycin 250 MG tablet  Commonly known as:  ZITHROMAX  Take 1 tablet (250 mg total) by mouth as directed.     CALTRATE 600+D 600-400 MG-UNIT per tablet  Generic drug:  Calcium Carbonate-Vitamin D  Take 1 tablet by mouth daily.     fexofenadine 180 MG tablet  Commonly known as:  ALLEGRA  Take 180 mg by mouth daily.     multivitamin tablet  Take 1 tablet by mouth daily.     OCUVITE PO  Take by mouth. Every week(rare use)     SYNTHROID PO  Take 25 mcg by mouth daily.     valACYclovir 500 MG tablet  Commonly known as:  VALTREX  Take 500 mg by mouth as needed.

## 2014-08-23 NOTE — Telephone Encounter (Signed)
Offer doxycycline 100 mg, # 8, 2 today then one daily 

## 2014-09-08 ENCOUNTER — Telehealth: Payer: Self-pay | Admitting: Internal Medicine

## 2014-09-08 MED ORDER — CLARITHROMYCIN 500 MG PO TABS: 500.0000 mg | ORAL_TABLET | Freq: Two times a day (BID) | ORAL | Status: DC

## 2014-09-08 NOTE — Telephone Encounter (Signed)
Spoke with the pt  She states that she has "another sinus infection" Started with laryngitis 5 days ago- c/o sinus pressure and prod cough with green sputum  She denies any fever, wheezing, SOB or other co's She wants an abx called in "zpacks and penicillin do not help" Please advise, thanks! Last ov 06/14/14  Next ov 12/12/14  Allergies  Allergen Reactions  . Penicillins   . Pentamidine   . Sulfonamide Derivatives

## 2014-09-08 NOTE — Telephone Encounter (Signed)
Per [facility]- call in biaxin 500 mg # 20 1 bid take with food  Spoke with the pt and notified of this  Rx was sent to pharm

## 2014-09-18 ENCOUNTER — Ambulatory Visit (INDEPENDENT_AMBULATORY_CARE_PROVIDER_SITE_OTHER): Payer: 59 | Admitting: Internal Medicine

## 2014-09-18 ENCOUNTER — Encounter: Payer: Self-pay | Admitting: Internal Medicine

## 2014-09-18 VITALS — BP 122/74 | HR 97 | Ht 64.5 in | Wt 156.8 lb

## 2014-09-18 DIAGNOSIS — J328 Other chronic sinusitis: Secondary | ICD-10-CM

## 2014-09-18 MED ORDER — DOXYCYCLINE HYCLATE 100 MG PO TABS: ORAL_TABLET | ORAL | Status: DC

## 2014-09-18 NOTE — Progress Notes (Signed)
Subjective:    Patient ID: Bianca Parker, female    DOB: [birth year], 69 y.o.   MRN: [id number]  HPI 5/2HPI  81 yoF followed for Allergic rhinitis, chronic rhinosinusitis. Hx chemoRx for CLL in remission.  Last here- March 9. She responded to antibiotic, but then got another cold clearing more quickly. Saw Dr Lucia Gaskins- recognized "congestion" left nostril.  Denies infection now. Catches colds very quickly from her grandchild.  Coming today off antihistamines for allergy testing.  Demonstrated immunoglobulin deficiency as of 6/ 2011, residual from her chemotherapy of 8-9 years ago, reviewed.  She has encasings and HEPA filter.  Allergy profile- 11/01/10- IgE < 1.5; no specific elevations. Also low IgG for penicillin  Skin testing- weak positive intradermal reactions to common inhalants.   02/24/11- 63 yoF followed for Allergic rhinitis, chronic rhinosinusitis. Hx chemoRx for CLL in remission Since last here she has done fairly well with fexofenadine. She has had one "sinus infection" since last here. We talked again about non-bacterial sinusitis. She emphases that she gets fever, bed-ridden malaise. Very concerned about her host resistance because of her leukemia hx. We discussed how to culture, risks of allergy and resistance with frequent antibiotics.  Today she feels well- 2 months without an infection. Realizes risks with exposure to her gransdson's virus colds.   07/01/11-  67 yoF followed for Allergic rhinitis, chronic rhinosinusitis. Hx chemoRx for CLL in remission/ Dr Beryle Beams Has had flu vaccine. A cold  has spread to her family. She started herself with saline nasal rinse using boiled well water. Got Zicam lozenges and thinks she fought off the cold. Takes probiotics routinely. Her CLL may not be in remission now. Neutrophils and platelets are down. 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  07/07/12- 52 yoF followed for Allergic rhinitis, chronic rhinosinusitis.  Hx chemoRx for CLL in remission/ Dr Beryle Beams, acquired hypogammaglobulinemia Recently having ? bronchitis flare-was put on Pred pak from PCP. No sinus congestion or sinus trouble. Somewhat better but not completly gone. She chronic bronchitis 3 weeks ago from her husband- prednisone taper from [place]. Did shake off earlier cold. Was treated with Avelox in September. Now scant white phlegm or pale green. Some rattle. No fever or chills. Mucinex DM helps. Dr Beryle Beams heme-onc treating acquired hypogammaglobulinemia with IVIG. Her CLL remains in remission.  02/15/13- 67 yoF followed for Allergic rhinitis, chronic rhinosinusitis. Hx chemoRx for CLL in remission/ Dr Beryle Beams, acquired hypogammaglobulinemia ACUTE VISIT: recently having flare ups; allergy flare ups as well. Stayed congested and stuffy -almost like a cold. Blames spring allergy season. Home has a new heating system and duct. Feeling better with summer weather. Benadryl and Allegra daily. Flonase raised glaucoma pressure. Does Neti pot with 1-2 drops of Afrin.  07/05/13- 67 yoF followed for Allergic rhinitis, chronic rhinosinusitis. Hx chemoRx for CLL in remission/ Dr Beryle Beams, acquired hypogammaglobulinemia FOLLOWS FOR: 5 month follow up.  does report some head congestion with clear mucus, some PND, some prod cough x3 weeks.  denies wheezing, increased SOB, f/c/s. Off IVIG. Fought off a cold without antibiotics. Diagnosed borderline glaucoma. Uses saline nasal rinse if needed.  06/14/14-  67 yoF followed for Allergic rhinitis, chronic rhinosinusitis. Hx chemoRx for CLL in remission, acquired hypogammaglobulinemia FOLLOWS FOR: Allergies are kicking in more this week. Should she change her meds. Immunoglobulin deficiency. Oncologist considered IVIG but patient has felt very well and wanted to wait. She emphasizes healthy life style and only needed antibiotic once since last visit., Hiking. Takes  Zicam at onset of colds. IVIG  only is sick in hospital. She sought opinion from [place] . Noting only minor head congestion, nothing discolored.   09/18/14- 67 yoF followed for Allergic rhinitis, chronic rhinosinusitis. Hx chemoRx for CLL in remission, acquired hypogammaglobulinemia ACUTE VISIT: ear pain with sore throat as well-started Saturday morning. Currently on Biaxin abx-2 doses left. She reports 3 distinct colds since mid December, each becoming purulent and each responding first a Z-Pak then doxycycline and most recently Biaxin. With each episode she had conjunctivitis treated by her eye doctor. Now 2 days of left earache and sore throat. She has used her Ciprodex.  Review of Systems- see HPI Constitutional:   No-   weight loss, night sweats, fevers, chills, fatigue, lassitude. HEENT:   No-  headaches, difficulty swallowing, tooth/dental problems, sore throat,       No-  sneezing, itching, +ear ache, +nasal congestion, post nasal drip,  CV:  No-   chest pain, orthopnea, PND, swelling in lower extremities, anasarca, dizziness, palpitations Resp: No-   shortness of breath with exertion or at rest.              No-  productive cough,   non-productive cough,  No- coughing up of blood.              No-change in color of mucus.  No- wheezing.   Skin: No-   rash or lesions. GI:  No-   heartburn, indigestion, abdominal pain, nausea, vomiting,  GU: . MS:  No-   joint pain or swelling. Neuro-     nothing unusual Psych:  No- change in mood or affect. No depression or anxiety.  No memory loss.  Objective:   Physical Exam General- Alert, Oriented, Affect-appropriate, Distress- none acute; well appearing, medium                       Build. Looks well.  Skin- rash-none, lesions- none, excoriation- none Lymphadenopathy- none Head- atraumatic            Eyes- Gross vision intact, PERRLA, conjunctivae clear secretions            Ears- Hearing, canals-normal. Sclerosis of TMs-no inflammation or fluid            Nose-  +turbinate edema, no-Septal dev, mucus, polyps, erosion, perforation             Throat- Mallampati II , mucosa + red without exudate , drainage- none, tonsils- atrophic Neck- flexible , trachea midline, no stridor , thyroid nl, carotid no bruit Chest - symmetrical excursion , unlabored           Heart/CV- RRR , no murmur , no gallop  , no rub, nl s1 s2                           - JVD- none , edema- none, stasis changes- none, varices- none           Lung- clear to P&A, wheeze- none, cough-none , dullness-none, rub- none           Chest wall-  Abd-  Br/ Gen/ Rectal- Not done, not indicated Extrem- cyanosis- none, clubbing, none, atrophy- none, strength- nl Neuro- grossly intact to observation

## 2014-09-18 NOTE — Assessment & Plan Note (Signed)
Immune or depressed with recurrent upper airway infections, now including early sinusitis and eustachian dysfunction. Plan-finished Biaxin as planned tomorrow. Refillable prescription for doxycycline with instruction

## 2014-09-18 NOTE — Patient Instructions (Addendum)
Script sent for doxycycline with refills. You can start this tomorrow if you think you need to. Keep a bottle on hand at home. Please call as needed  Keep April appointment as planned

## 2014-10-05 ENCOUNTER — Other Ambulatory Visit: Payer: Self-pay | Admitting: Pharmacist

## 2014-12-12 ENCOUNTER — Encounter: Payer: Self-pay | Admitting: Internal Medicine

## 2014-12-12 ENCOUNTER — Other Ambulatory Visit: Payer: 59

## 2014-12-12 ENCOUNTER — Ambulatory Visit (INDEPENDENT_AMBULATORY_CARE_PROVIDER_SITE_OTHER): Payer: 59 | Admitting: Internal Medicine

## 2014-12-12 VITALS — BP 130/70 | HR 76 | Ht 65.0 in | Wt 187.8 lb

## 2014-12-12 DIAGNOSIS — Z889 Allergy status to unspecified drugs, medicaments and biological substances status: Secondary | ICD-10-CM | POA: Diagnosis not present

## 2014-12-12 DIAGNOSIS — J309 Allergic rhinitis, unspecified: Secondary | ICD-10-CM

## 2014-12-12 DIAGNOSIS — J3089 Other allergic rhinitis: Secondary | ICD-10-CM

## 2014-12-12 DIAGNOSIS — J302 Other seasonal allergic rhinitis: Secondary | ICD-10-CM

## 2014-12-12 NOTE — Progress Notes (Signed)
Subjective:    Patient ID: Bianca Parker, female    DOB: 07-27-1946, 69 y.o.   MRN: [id number]  HPI 5/2HPI  88 yoF followed for Allergic rhinitis, chronic rhinosinusitis. Hx chemoRx for CLL in remission.  Last here- March 9. She responded to antibiotic, but then got another cold clearing more quickly. Saw Dr Lucia Gaskins- recognized "congestion" left nostril.  Denies infection now. Catches colds very quickly from her grandchild.  Coming today off antihistamines for allergy testing.  Demonstrated immunoglobulin deficiency as of 6/ 2011, residual from her chemotherapy of 8-9 years ago, reviewed.  She has encasings and HEPA filter.  Allergy profile- 11/01/10- IgE < 1.5; no specific elevations. Also low IgG for penicillin  Skin testing- weak positive intradermal reactions to common inhalants.   02/24/11- 39 yoF followed for Allergic rhinitis, chronic rhinosinusitis. Hx chemoRx for CLL in remission Since last here she has done fairly well with fexofenadine. She has had one "sinus infection" since last here. We talked again about non-bacterial sinusitis. She emphases that she gets fever, bed-ridden malaise. Very concerned about her host resistance because of her leukemia hx. We discussed how to culture, risks of allergy and resistance with frequent antibiotics.  Today she feels well- 2 months without an infection. Realizes risks with exposure to her gransdson's virus colds.   07/01/11-  47 yoF followed for Allergic rhinitis, chronic rhinosinusitis. Hx chemoRx for CLL in remission/ Dr Beryle Beams Has had flu vaccine. A cold  has spread to her family. She started herself with saline nasal rinse using boiled well water. Got Zicam lozenges and thinks she fought off the cold. Takes probiotics routinely. Her CLL may not be in remission now. Neutrophils and platelets are down. 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  Hx chemoRx for CLL in remission/ Dr Beryle Beams, acquired hypogammaglobulinemia Recently having ? bronchitis flare-was put on Pred pak from PCP. No sinus congestion or sinus trouble. Somewhat better but not completly gone. She chronic bronchitis 3 weeks ago from her husband- prednisone taper from [place]. Did shake off earlier cold. Was treated with Avelox in September. Now scant white phlegm or pale green. Some rattle. No fever or chills. Mucinex DM helps. Dr Beryle Beams heme-onc treating acquired hypogammaglobulinemia with IVIG. Her CLL remains in remission.  02/15/13- 67 yoF followed for Allergic rhinitis, chronic rhinosinusitis. Hx chemoRx for CLL in remission/ Dr Beryle Beams, acquired hypogammaglobulinemia ACUTE VISIT: recently having flare ups; allergy flare ups as well. Stayed congested and stuffy -almost like a cold. Blames spring allergy season. Home has a new heating system and duct. Feeling better with summer weather. Benadryl and Allegra daily. Flonase raised glaucoma pressure. Does Neti pot with 1-2 drops of Afrin.  07/05/13- 67 yoF followed for Allergic rhinitis, chronic rhinosinusitis. Hx chemoRx for CLL in remission/ Dr Beryle Beams, acquired hypogammaglobulinemia FOLLOWS FOR: 5 month follow up.  does report some head congestion with clear mucus, some PND, some prod cough x3 weeks.  denies wheezing, increased SOB, f/c/s. Off IVIG. Fought off a cold without antibiotics. Diagnosed borderline glaucoma. Uses saline nasal rinse if needed.  06/14/14-  67 yoF followed for Allergic rhinitis, chronic rhinosinusitis. Hx chemoRx for CLL in remission, acquired hypogammaglobulinemia FOLLOWS FOR: Allergies are kicking in more this week. Should she change her meds. Immunoglobulin deficiency. Oncologist considered IVIG but patient has felt very well and wanted to wait. She emphasizes healthy life style and only needed antibiotic once since last visit., Hiking. Takes  Zicam at onset of colds. IVIG  only is sick in hospital. She sought opinion from [facility] . Noting only minor head congestion, nothing discolored.   09/18/14- 67 yoF followed for Allergic rhinitis, chronic rhinosinusitis. Hx chemoRx for CLL in remission, acquired hypogammaglobulinemia ACUTE VISIT: ear pain with sore throat as well-started Saturday morning. Currently on Biaxin abx-2 doses left. She reports 3 distinct colds since mid December, each becoming purulent and each responding first a Z-Pak then doxycycline and most recently Biaxin. With each episode she had conjunctivitis treated by her eye doctor. Now 2 days of left earache and sore throat. She has used her Ciprodex.  12/12/14-69 yoF followed for Allergic rhinitis, chronic rhinosinusitis. Hx chemoRx for CLL in remission, acquired hypogammaglobulinemia FOLLOW FOR: patient feeling much better.  discuss allergies.  Had repeated eye infections and respiratory infections this winter but all resolved now. Complains of "allergy" waking in the morning with cough, postnasal drip, ear pain relieved by popping ears, white sputum. Clears after a few hours. Taking fexofenadine each morning *An Additional problem-asks clarification drug allergy. History of rash with penicillin and sulfa, anaphylaxis with pentamidine.  Review of Systems- see HPI Constitutional:   No-   weight loss, night sweats, fevers, chills, fatigue, lassitude. HEENT:   No-  headaches, difficulty swallowing, tooth/dental problems, sore throat,       No-  sneezing, itching, +ear ache, +nasal congestion, +ost nasal drip,  CV:  No-   chest pain, orthopnea, PND, swelling in lower extremities, anasarca, dizziness, palpitations Resp: No-   shortness of breath with exertion or at rest.              +  productive cough,   non-productive cough,  No- coughing up of blood.              No-change in color of mucus.  No- wheezing.   Skin: No-   rash or lesions. GI:  No-   heartburn, indigestion, abdominal pain, nausea,  vomiting,  GU: . MS:  No-   joint pain or swelling. Neuro-     nothing unusual Psych:  No- change in mood or affect. No depression or anxiety.  No memory loss.  Objective:   Physical Exam General- Alert, Oriented, Affect-appropriate, Distress- none acute; well appearing, medium                       Build. Looks well.  Skin- rash-none, lesions- none, excoriation- none Lymphadenopathy- none Head- atraumatic            Eyes- Gross vision intact, PERRLA, conjunctivae clear secretions            Ears- Hearing, canals-normal. Sclerosis of TMs-no inflammation or fluid            Nose- +turbinate edema, no-Septal dev, mucus, polyps, erosion, perforation             Throat- Mallampati II , mucosa + red without exudate , drainage- none, tonsils- atrophic Neck- flexible , trachea midline, no stridor , thyroid nl, carotid no bruit Chest - symmetrical excursion , unlabored           Heart/CV- RRR , no murmur , no gallop  , no rub, nl s1 s2                           - JVD- none , edema- none, stasis changes- none, varices- none  Lung- clear to P&A, wheeze- none, cough-none , dullness-none, rub- none           Chest wall-  Abd-  Br/ Gen/ Rectal- Not done, not indicated Extrem- cyanosis- none, clubbing, none, atrophy- none, strength- nl Neuro- grossly intact to observation

## 2014-12-12 NOTE — Patient Instructions (Signed)
Order- allergy IgE for Penicillin V, Penicillin G, Sulfa       Dx drug allergy  Ok to take an extra antihistamine occasionally

## 2014-12-12 NOTE — Assessment & Plan Note (Signed)
She attributes morning nasal congestion and postnasal drainage to allergy. Plan-try taking fexofenadine at bedtime so it can work during the night.

## 2014-12-12 NOTE — Assessment & Plan Note (Signed)
We have capability of testing for IgE to penicillins and sulfa as discussed with her.

## 2014-12-13 LAB — ALLERGEN PENICILLIN G IGE: PENICILLIN G IGE ALLERGEN: <0.1 kU/L

## 2014-12-13 LAB — ALLERGEN PENICILLIN V (MINOR): Allergen Penicillin V (Minor): <0.1 kU/L

## 2014-12-19 LAB — SULFA IGE
Class: NEGATIVE
Sulfamethoxazole IgE: 0 IU/mL (ref <0.05)

## 2014-12-20 ENCOUNTER — Telehealth: Payer: Self-pay | Admitting: Internal Medicine

## 2014-12-20 NOTE — Telephone Encounter (Signed)
Patient notified of lab results. Nothing further needed.  

## 2015-02-01 ENCOUNTER — Telehealth: Payer: Self-pay | Admitting: Internal Medicine

## 2015-02-01 MED ORDER — AZITHROMYCIN 250 MG PO TABS: ORAL_TABLET | ORAL | Status: DC

## 2015-02-01 NOTE — Telephone Encounter (Signed)
Pt aware of recs. Nothing further needed and rx sent in

## 2015-02-01 NOTE — Telephone Encounter (Signed)
Spoke with pt. States that she thinks she has a cold. Reports lots of sinus congestion and fatigue. Mucus is clear. Her biggest complaint is fatigue. Would like to know CY's recommendations.  Allergies  Allergen Reactions  . Penicillins   . Pentamidine   . Sulfonamide Derivatives     Current Outpatient Prescriptions on File Prior to Visit  Medication Sig Dispense Refill  . Calcium Carbonate-Vitamin D (CALTRATE 600+D) 600-400 MG-UNIT per tablet Take 1 tablet by mouth daily.    . fexofenadine (ALLEGRA) 180 MG tablet Take 180 mg by mouth daily.      . Levothyroxine Sodium (SYNTHROID PO) Take 25 mcg by mouth daily.    . Multiple Vitamin (MULTIVITAMIN) tablet Take 1 tablet by mouth daily.    . valACYclovir (VALTREX) 500 MG tablet Take 500 mg by mouth as needed.     No current facility-administered medications on file prior to visit.    CY - please advise. Thanks.

## 2015-02-01 NOTE — Telephone Encounter (Signed)
Offer Zpak 

## 2015-03-07 ENCOUNTER — Other Ambulatory Visit (HOSPITAL_BASED_OUTPATIENT_CLINIC_OR_DEPARTMENT_OTHER): Payer: 59

## 2015-03-07 ENCOUNTER — Telehealth: Payer: Self-pay | Admitting: Hematology and Oncology

## 2015-03-07 ENCOUNTER — Encounter: Payer: Self-pay | Admitting: Hematology and Oncology

## 2015-03-07 ENCOUNTER — Ambulatory Visit (HOSPITAL_BASED_OUTPATIENT_CLINIC_OR_DEPARTMENT_OTHER): Payer: 59 | Admitting: Hematology and Oncology

## 2015-03-07 VITALS — BP 134/75 | HR 78 | Temp 98.7°F | Resp 17 | Ht 65.0 in | Wt 205.8 lb

## 2015-03-07 DIAGNOSIS — C9111 Chronic lymphocytic leukemia of B-cell type in remission: Secondary | ICD-10-CM

## 2015-03-07 DIAGNOSIS — D801 Nonfamilial hypogammaglobulinemia: Secondary | ICD-10-CM

## 2015-03-07 DIAGNOSIS — R635 Abnormal weight gain: Secondary | ICD-10-CM

## 2015-03-07 LAB — CBC WITH DIFFERENTIAL/PLATELET
BASO%: 0.1 % (ref 0.0–2.0)
Basophils Absolute: 0 10*3/uL (ref 0.0–0.1)
EOS ABS: 0.1 10*3/uL (ref 0.0–0.5)
EOS%: 1.8 % (ref 0.0–7.0)
HEMATOCRIT: 44.3 % (ref 34.8–46.6)
HEMOGLOBIN: 14.6 g/dL (ref 11.6–15.9)
LYMPH%: 26.1 % (ref 14.0–49.7)
MCH: 29.9 pg (ref 25.1–34.0)
MCHC: 33 g/dL (ref 31.5–36.0)
MCV: 90.8 fL (ref 79.5–101.0)
MONO#: 0.5 10*3/uL (ref 0.1–0.9)
MONO%: 8.6 % (ref 0.0–14.0)
NEUT#: 3.9 10*3/uL (ref 1.5–6.5)
NEUT%: 63.4 % (ref 38.4–76.8)
Platelets: 163 10*3/uL (ref 145–400)
RBC: 4.88 10*6/uL (ref 3.70–5.45)
RDW: 13.7 % (ref 11.2–14.5)
WBC: 6.1 10*3/uL (ref 3.9–10.3)
lymph#: 1.6 10*3/uL (ref 0.9–3.3)

## 2015-03-07 LAB — COMPREHENSIVE METABOLIC PANEL (CC13)
ALT: 35 U/L (ref 0–55)
AST: 27 U/L (ref 5–34)
Albumin: 4 g/dL (ref 3.5–5.0)
Alkaline Phosphatase: 66 U/L (ref 40–150)
Anion Gap: 8 mEq/L (ref 3–11)
BILIRUBIN TOTAL: 0.58 mg/dL (ref 0.20–1.20)
BUN: 15.5 mg/dL (ref 7.0–26.0)
CALCIUM: 9.7 mg/dL (ref 8.4–10.4)
CHLORIDE: 110 meq/L — AB (ref 98–109)
CO2: 25 mEq/L (ref 22–29)
Creatinine: 0.9 mg/dL (ref 0.6–1.1)
EGFR: 64 mL/min/{1.73_m2} — ABNORMAL LOW (ref >90)
Glucose: 92 mg/dl (ref 70–140)
POTASSIUM: 4.3 meq/L (ref 3.5–5.1)
SODIUM: 144 meq/L (ref 136–145)
Total Protein: 6.2 g/dL — ABNORMAL LOW (ref 6.4–8.3)

## 2015-03-07 LAB — LACTATE DEHYDROGENASE (CC13): LDH: 245 U/L (ref 125–245)

## 2015-03-07 NOTE — Assessment & Plan Note (Signed)
She has excessive weight gain of over 60 pounds over the last 9 months. She claimed this is related to her recurrent illness. I will order thyroid function tests in the next visit.

## 2015-03-07 NOTE — Assessment & Plan Note (Signed)
Clinically, she remained in remission. Continues annual follow-up with history, physical examination and blood work.  

## 2015-03-07 NOTE — Telephone Encounter (Signed)
Gave and printed appt sched for July thru OCT

## 2015-03-07 NOTE — Assessment & Plan Note (Addendum)
This is related to long-term side effects from prior chemotherapy. She has recurrent infection with herpesvirus,, bronchitis and I infection She is already on chronic treatment with Valtrex daily. The patient had infusion reaction with sick serum sickness with IVIG infusion. However, with recurrent infection, I think we need to try IVIG again. I will start her at 1 g/kg every [redacted] weeks along with premedication with Benadryl, Tylenol and Solu-Medrol. The risk, benefit, side effects of IVIG is fully discussed with the patient and she agreed to proceed. I will see her back in 3 months for further assessment

## 2015-03-07 NOTE — Progress Notes (Signed)
[facility] OFFICE PROGRESS NOTE  Patient Care Team: Rowe Clack, MD as PCP - General Deneise Lever, MD as Referring Physician (Pulmonary Disease) Rozetta Nunnery, MD as Referring Physician (Otolaryngology) Milus Banister, MD (Gastroenterology) Arvella Nigh, MD (Obstetrics and Gynecology) Heath Lark, MD as Consulting Physician (Hematology and Oncology) Jari Pigg, MD as Consulting Physician (Dermatology)  SUMMARY OF ONCOLOGIC HISTORY: She was in one of the first cohorts of patients treated with FCR on protocol at the [facility] at time of her initial diagnosis in July 2002. Technically she had RAI  stage III disease due to anemia but she had no organomegaly or lymphadenopathy on exam. FCR was started in 12/2001, and continued through 06/2002. She achieved a complete hematologic response and,which has been durable now for over 11 years!  She did develop hypogammaglobulinemia. As a result, she gets recurrent bronchitis and sinusitis. In April 2013 she was started her on monthly intravenous immunoglobulin 400 mg per kilogram and this has significantly impacted on the frequency and severity of her infections. This was continued through April of 2014. She has had no new infections since stopping the IVIG but as expected, immunoglobulin levels have fallen to pretreatment levels.   She has a history of her recurrent squamous cell carcinomas of her skin and has had 2 lesions excised from her left tibial area and another 1 excised from the vulvar area. Recently, she had a basal cell carcinoma removed. She has a history of both herpes zoster and herpes simplex infections. Recently, she had abnormal Pap smear and currently on close observation. Recent colposcopy was negative.  She received IVIG treatment for acquired hypogammaglobulinemia but stopped in April 2014 due to sick serum syndrome  INTERVAL HISTORY: Please see below for problem oriented charting. She  returns as part of CLL followup. She denies skin cancer. Since her visit last year, she have at least 5-6 bouts of severe bronchitis/sinusitis and eye infections She feels well. Denies new lymphadenopathy REVIEW OF SYSTEMS:   Constitutional: Denies fevers, chills or abnormal weight loss Eyes: Denies blurriness of vision Ears, nose, mouth, throat, and face: Denies mucositis or sore throat Respiratory: Denies cough, dyspnea or wheezes Cardiovascular: Denies palpitation, chest discomfort or lower extremity swelling Gastrointestinal:  Denies nausea, heartburn or change in bowel habits Skin: Denies abnormal skin rashes Lymphatics: Denies new lymphadenopathy or easy bruising Neurological:Denies numbness, tingling or new weaknesses Behavioral/Psych: Mood is stable, no new changes  All other systems were reviewed with the patient and are negative.  I have reviewed the past medical history, past surgical history, social history and family history with the patient and they are unchanged from previous note.  ALLERGIES:  is allergic to penicillins; pentamidine; and sulfonamide derivatives.  MEDICATIONS:  Current Outpatient Prescriptions  Medication Sig Dispense Refill  . azithromycin (ZITHROMAX) 250 MG tablet Take as directed 6 tablet 0  . Calcium Carbonate-Vitamin D (CALTRATE 600+D) 600-400 MG-UNIT per tablet Take 1 tablet by mouth daily.    . fexofenadine (ALLEGRA) 180 MG tablet Take 180 mg by mouth daily.      . Levothyroxine Sodium (SYNTHROID PO) Take 25 mcg by mouth daily.    . Multiple Vitamin (MULTIVITAMIN) tablet Take 1 tablet by mouth daily.    . valACYclovir (VALTREX) 500 MG tablet Take 500 mg by mouth as needed.     No current facility-administered medications for this visit.    PHYSICAL EXAMINATION: ECOG PERFORMANCE STATUS: 0 - Asymptomatic  Filed Vitals:  03/07/15 0823  BP: 134/75  Pulse: 78  Temp: 98.7 F (37.1 C)  Resp: 17   Filed Weights   03/07/15 0823  Weight:  205 lb 12.8 oz (93.35 kg)    GENERAL:alert, no distress and comfortable. She is obese SKIN: skin color, texture, turgor are normal, no rashes or significant lesions EYES: normal, Conjunctiva are pink and non-injected, sclera clear OROPHARYNX:no exudate, no erythema and lips, buccal mucosa, and tongue normal  NECK: supple, thyroid normal size, non-tender, without nodularity LYMPH:  no palpable lymphadenopathy in the cervical, axillary or inguinal LUNGS: clear to auscultation and percussion with normal breathing effort HEART: regular rate & rhythm and no murmurs and no lower extremity edema ABDOMEN:abdomen soft, non-tender and normal bowel sounds Musculoskeletal:no cyanosis of digits and no clubbing  NEURO: alert & oriented x 3 with fluent speech, no focal motor/sensory deficits  LABORATORY DATA:  I have reviewed the data as listed    Component Value Date/Time   NA 144 03/07/2015 0756   NA 141 05/22/2014 0737   K 4.3 03/07/2015 0756   K 4.3 05/22/2014 0737   CL 104 05/22/2014 0737   CL 109* 01/20/2013 0801   CO2 25 03/07/2015 0756   CO2 30 05/22/2014 0737   GLUCOSE 92 03/07/2015 0756   GLUCOSE 80 05/22/2014 0737   GLUCOSE 89 01/20/2013 0801   BUN 15.5 03/07/2015 0756   BUN 19 05/22/2014 0737   CREATININE 0.9 03/07/2015 0756   CREATININE 1.0 05/22/2014 0737   CALCIUM 9.7 03/07/2015 0756   CALCIUM 9.5 05/22/2014 0737   PROT 6.2* 03/07/2015 0756   PROT 6.1 05/22/2014 0737   ALBUMIN 4.0 03/07/2015 0756   ALBUMIN 4.4 05/22/2014 0737   AST 27 03/07/2015 0756   AST 33 05/22/2014 0737   ALT 35 03/07/2015 0756   ALT 40* 05/22/2014 0737   ALKPHOS 66 03/07/2015 0756   ALKPHOS 40 05/22/2014 0737   BILITOT 0.58 03/07/2015 0756   BILITOT 0.9 05/22/2014 0737   GFRNONAA 57* 04/18/2009 0943   GFRAA  04/18/2009 0943    >60        The eGFR has been calculated using the MDRD equation. This calculation has not been validated in all clinical situations. eGFR's persistently <60 mL/min  signify possible Chronic Kidney Disease.    No results found for: SPEP, UPEP  Lab Results  Component Value Date   WBC 6.1 03/07/2015   NEUTROABS 3.9 03/07/2015   HGB 14.6 03/07/2015   HCT 44.3 03/07/2015   MCV 90.8 03/07/2015   PLT 163 03/07/2015      Chemistry      Component Value Date/Time   NA 144 03/07/2015 0756   NA 141 05/22/2014 0737   K 4.3 03/07/2015 0756   K 4.3 05/22/2014 0737   CL 104 05/22/2014 0737   CL 109* 01/20/2013 0801   CO2 25 03/07/2015 0756   CO2 30 05/22/2014 0737   BUN 15.5 03/07/2015 0756   BUN 19 05/22/2014 0737   CREATININE 0.9 03/07/2015 0756   CREATININE 1.0 05/22/2014 0737   GLU 92 06/17/2013      Component Value Date/Time   CALCIUM 9.7 03/07/2015 0756   CALCIUM 9.5 05/22/2014 0737   ALKPHOS 66 03/07/2015 0756   ALKPHOS 40 05/22/2014 0737   AST 27 03/07/2015 0756   AST 33 05/22/2014 0737   ALT 35 03/07/2015 0756   ALT 40* 05/22/2014 0737   BILITOT 0.58 03/07/2015 0756   BILITOT 0.9 05/22/2014 0737  ASSESSMENT & PLAN:  Chronic lymphoid leukemia in remission Clinically, she remained in remission. Continues annual follow-up with history, physical examination and blood work.  Hypogammaglobulinemia, acquired This is related to long-term side effects from prior chemotherapy. She has recurrent infection with herpesvirus,, bronchitis and I infection She is already on chronic treatment with Valtrex daily. The patient had infusion reaction with sick serum sickness with IVIG infusion. However, with recurrent infection, I think we need to try IVIG again. I will start her at 1 g/kg every [redacted] weeks along with premedication with Benadryl, Tylenol and Solu-Medrol. The risk, benefit, side effects of IVIG is fully discussed with the patient and she agreed to proceed. I will see her back in 3 months for further assessment  Excessive weight gain She has excessive weight gain of over 60 pounds over the last 9 months. She claimed this is  related to her recurrent illness. I will order thyroid function tests in the next visit.     Orders Placed This Encounter  Procedures  . CBC with Differential/Platelet    Standing Status: Future     Number of Occurrences:      Standing Expiration Date: 04/10/2016   All questions were answered. The patient knows to call the clinic with any problems, questions or concerns. No barriers to learning was detected. I spent 30 minutes counseling the patient face to face. The total time spent in the appointment was 40 minutes and more than 50% was on counseling and review of test results     [facility], Mayola Mcbain, MD 03/07/2015 11:25 AM

## 2015-03-08 LAB — IGG, IGA, IGM
IgA: <7 mg/dL — ABNORMAL LOW (ref 69–380)
IgG (Immunoglobin G), Serum: 183 mg/dL — ABNORMAL LOW (ref 690–1700)
IgM, Serum: 7 mg/dL — ABNORMAL LOW (ref 52–322)

## 2015-03-12 ENCOUNTER — Ambulatory Visit (HOSPITAL_BASED_OUTPATIENT_CLINIC_OR_DEPARTMENT_OTHER): Payer: 59

## 2015-03-12 VITALS — BP 131/69 | HR 92 | Temp 97.1°F | Resp 18

## 2015-03-12 DIAGNOSIS — D801 Nonfamilial hypogammaglobulinemia: Secondary | ICD-10-CM | POA: Diagnosis not present

## 2015-03-12 MED ORDER — SODIUM CHLORIDE 0.9 % IV SOLN
Freq: Once | INTRAVENOUS | Status: AC
  Administered 2015-03-12: 10:00:00 via INTRAVENOUS

## 2015-03-12 MED ORDER — DIPHENHYDRAMINE HCL 25 MG PO CAPS
ORAL_CAPSULE | ORAL | Status: AC
  Filled 2015-03-12: qty 2

## 2015-03-12 MED ORDER — METHYLPREDNISOLONE SODIUM SUCC 125 MG IJ SOLR
40.0000 mg | Freq: Once | INTRAMUSCULAR | Status: AC
  Administered 2015-03-12: 40 mg via INTRAVENOUS

## 2015-03-12 MED ORDER — DIPHENHYDRAMINE HCL 25 MG PO TABS
50.0000 mg | ORAL_TABLET | Freq: Once | ORAL | Status: AC
  Administered 2015-03-12: 50 mg via ORAL
  Filled 2015-03-12: qty 2

## 2015-03-12 MED ORDER — ACETAMINOPHEN 325 MG PO TABS
ORAL_TABLET | ORAL | Status: AC
  Filled 2015-03-12: qty 2

## 2015-03-12 MED ORDER — IMMUNE GLOBULIN (HUMAN) 10 GM/100ML IV SOLN
1.0000 g/kg | Freq: Once | INTRAVENOUS | Status: AC
  Administered 2015-03-12: 95 g via INTRAVENOUS
  Filled 2015-03-12: qty 950

## 2015-03-12 MED ORDER — ACETAMINOPHEN 325 MG PO TABS
650.0000 mg | ORAL_TABLET | Freq: Four times a day (QID) | ORAL | Status: DC | PRN
  Administered 2015-03-12: 650 mg via ORAL

## 2015-03-12 MED ORDER — METHYLPREDNISOLONE SODIUM SUCC 125 MG IJ SOLR
INTRAMUSCULAR | Status: AC
  Filled 2015-03-12: qty 2

## 2015-03-12 NOTE — Progress Notes (Signed)
Patient completed her first Octogam treatment without complications. Patient given IVIG handout. Patient discharged ambulatory.

## 2015-03-12 NOTE — Patient Instructions (Signed)

## 2015-03-13 ENCOUNTER — Telehealth: Payer: Self-pay | Admitting: *Deleted

## 2015-03-13 NOTE — Telephone Encounter (Signed)
Pt left voice mail stating she had IVIG yesterday. Today has "slight fever and slights chills with mild HA". States normal temp for her is 97.1, today is 99.4 with ibuprofen. Was instructed to let us know of any fever.

## 2015-03-13 NOTE — Telephone Encounter (Signed)
This is a common reaction Take tylenol three times a day, 650 mg X 2 more days and it should stop  Pt notified of Dr Alvy Bimler response above

## 2015-03-13 NOTE — Telephone Encounter (Signed)
This is a common reaction Take tylenol three times a day, 650 mg X 2 more days and it should stop

## 2015-04-09 ENCOUNTER — Ambulatory Visit (HOSPITAL_BASED_OUTPATIENT_CLINIC_OR_DEPARTMENT_OTHER): Payer: 59

## 2015-04-09 VITALS — BP 141/75 | HR 91 | Temp 97.2°F | Resp 18

## 2015-04-09 DIAGNOSIS — D801 Nonfamilial hypogammaglobulinemia: Secondary | ICD-10-CM

## 2015-04-09 MED ORDER — METHYLPREDNISOLONE SODIUM SUCC 125 MG IJ SOLR
INTRAMUSCULAR | Status: AC
  Filled 2015-04-09: qty 2

## 2015-04-09 MED ORDER — ACETAMINOPHEN 325 MG PO TABS
650.0000 mg | ORAL_TABLET | Freq: Once | ORAL | Status: AC
  Administered 2015-04-09: 650 mg via ORAL

## 2015-04-09 MED ORDER — METHYLPREDNISOLONE SODIUM SUCC 125 MG IJ SOLR
40.0000 mg | Freq: Once | INTRAMUSCULAR | Status: AC
  Administered 2015-04-09: 40 mg via INTRAVENOUS

## 2015-04-09 MED ORDER — ACETAMINOPHEN 325 MG PO TABS
ORAL_TABLET | ORAL | Status: AC
  Filled 2015-04-09: qty 2

## 2015-04-09 MED ORDER — METHYLPREDNISOLONE SODIUM SUCC 40 MG IJ SOLR
INTRAMUSCULAR | Status: AC
  Filled 2015-04-09: qty 1

## 2015-04-09 MED ORDER — DIPHENHYDRAMINE HCL 25 MG PO TABS
50.0000 mg | ORAL_TABLET | Freq: Once | ORAL | Status: AC
  Administered 2015-04-09: 50 mg via ORAL
  Filled 2015-04-09: qty 2

## 2015-04-09 MED ORDER — SODIUM CHLORIDE 0.9 % IV SOLN
INTRAVENOUS | Status: DC
  Administered 2015-04-09: 10:00:00 via INTRAVENOUS

## 2015-04-09 MED ORDER — IMMUNE GLOBULIN (HUMAN) 20 GM/200ML IV SOLN
95.0000 g | Freq: Once | INTRAVENOUS | Status: AC
  Administered 2015-04-09: 95 g via INTRAVENOUS
  Filled 2015-04-09: qty 950

## 2015-04-09 MED ORDER — DIPHENHYDRAMINE HCL 25 MG PO CAPS
ORAL_CAPSULE | ORAL | Status: AC
  Filled 2015-04-09: qty 2

## 2015-04-09 NOTE — Patient Instructions (Signed)

## 2015-04-09 NOTE — Progress Notes (Signed)
Upon assessment, pt states she had delayed reaction after last Octagam infusion that included flu like symptoms.  Reaction handled with medications from home after pt called in and RN spoke with Alvy Bimler.  Pt requesting to go at a slower rate to hopefully prevent subsequent delayed reactions.  Spoke with Gerald Stabs in pharmacy and decision made to do 2 rate increases to a max of 223 mL/hr.  Pt made aware of plan and in agreement to proceed with this plan.  Pt verbalized understanding to let me know if she feels anything different during infusion or while at home after d/c.

## 2015-04-16 ENCOUNTER — Telehealth: Payer: Self-pay | Admitting: *Deleted

## 2015-04-16 ENCOUNTER — Ambulatory Visit (HOSPITAL_BASED_OUTPATIENT_CLINIC_OR_DEPARTMENT_OTHER): Payer: 59 | Admitting: Nurse Practitioner

## 2015-04-16 ENCOUNTER — Encounter: Payer: Self-pay | Admitting: Nurse Practitioner

## 2015-04-16 ENCOUNTER — Other Ambulatory Visit: Payer: Self-pay | Admitting: *Deleted

## 2015-04-16 VITALS — BP 158/85 | HR 89 | Temp 98.4°F | Resp 18 | Ht 65.0 in | Wt 208.4 lb

## 2015-04-16 DIAGNOSIS — D801 Nonfamilial hypogammaglobulinemia: Secondary | ICD-10-CM

## 2015-04-16 DIAGNOSIS — C9111 Chronic lymphocytic leukemia of B-cell type in remission: Secondary | ICD-10-CM | POA: Diagnosis not present

## 2015-04-16 DIAGNOSIS — L03114 Cellulitis of left upper limb: Secondary | ICD-10-CM

## 2015-04-16 DIAGNOSIS — L0390 Cellulitis, unspecified: Secondary | ICD-10-CM | POA: Insufficient documentation

## 2015-04-16 MED ORDER — CEPHALEXIN 500 MG PO CAPS: 500.0000 mg | ORAL_CAPSULE | Freq: Four times a day (QID) | ORAL | Status: DC

## 2015-04-16 NOTE — Assessment & Plan Note (Signed)
Patient received her last IVIG infusion this past Monday, 04/09/2015.  She will continue to receive her IVIG infusions on a monthly basis.  Patient is scheduled to return for IVIG infusion only on 05/07/2015.  She will return on 06/04/2015 for labs, visit, and IVIG infusion.

## 2015-04-16 NOTE — Progress Notes (Signed)
[facility]   HPI: Bianca Parker 69 y.o. female diagnosed with chronic lymphocytic leukemia and hypogammaglobulinemia.  Patient is undergoing observation only for her CLL.  Patient is undergoing IVIG treatments on a monthly basis for her acquired hypogammaglobulinemia.  Patient received her last IVIG infusion on Monday, 04/09/2015.  She reports developing some erythema, edema, mild warmth, and tenderness to the left forearm site following the infusion.  She states that the entire area is slowly improving.  She denies any recent fevers or chills.  HPI  ROS  Past Medical History  Diagnosis Date  . Anemia   . CARCINOMA, SKIN, SQUAMOUS CELL   . Allergic rhinitis, cause unspecified   . Chronic lymphoid leukemia in remission 2002 dx, tx 2003  . Dyslipidemia   . Hypogammaglobulinemia, acquired 11/18/2011    Due to CLL and prior chemo  . Hyperlipidemia   . Hepatitis A antibody positive 04/06/2012    Acute diarrheal illness & elevated liver function tests 02/18/12;     Past Surgical History  Procedure Laterality Date  . Abdominal hysterectomy  1966  . Tonsillectomy    . Bunionectomy    . Sigmoidoscopy    . Skin biopsy      has CARCINOMA, SKIN, SQUAMOUS CELL; Chronic lymphoid leukemia in remission; ANEMIA-NOS; Seasonal and perennial allergic rhinitis; RHINOSINUSITIS, CHRONIC; Drug allergy; Dyslipidemia; Hypogammaglobulinemia, acquired; LFT elevation; Cyst of right kidney; Diarrhea; Hepatitis A antibody positive; Hepatitis B antibody positive; Acute bronchitis; Hyperlipidemia; Excessive weight gain; and Cellulitis on her problem list.    is allergic to penicillins; pentamidine; and sulfonamide derivatives.    Medication List       This list is accurate as of: 04/16/15 10:31 AM.  Always use your most recent med list.               CALTRATE 600+D 600-400 MG-UNIT per tablet  Generic drug:  Calcium Carbonate-Vitamin D  Take 1 tablet by mouth daily.     cephALEXin 500 MG capsule  Commonly known as:  KEFLEX  Take 1 capsule (500 mg total) by mouth 4 (four) times daily.     fexofenadine 180 MG tablet  Commonly known as:  ALLEGRA  Take 180 mg by mouth daily.     multivitamin tablet  Take 1 tablet by mouth daily.     SYNTHROID PO  Take 25 mcg by mouth daily.     valACYclovir 500 MG tablet  Commonly known as:  VALTREX  Take 500 mg by mouth as needed.         PHYSICAL EXAMINATION  Oncology Vitals 04/16/2015 04/09/2015 04/09/2015 03/12/2015 03/12/2015 03/12/2015 03/12/2015  Height 165 cm - - - - - -  Weight 94.53 kg - - - - - -  Weight (lbs) 208 lbs 6 oz - - - - - -  BMI (kg/m2) 34.68 kg/m2 - - - - - -  Temp 98.4 97.2 97.4 97.1 97.6 98 97.9  Pulse 89 91 87 92 91 80 87  Resp _0 Resp (Historical as of 03/25/12) - - - - - - -  SpO2 95 - 94 95 - 95 96  BSA (m2) 2.08 m2 - - - - - -   BP Readings from Last 3 Encounters:  04/16/15 158/85  04/09/15 141/75  03/12/15 131/69    Physical Exam  Constitutional: She is oriented to person, place, and time and well-developed, well-nourished, and in no distress.  HENT:  Head: Normocephalic and  atraumatic.  Eyes: Conjunctivae and EOM are normal. Pupils are equal, round, and reactive to light. Right eye exhibits no discharge. Left eye exhibits no discharge. No scleral icterus.  Neck: Normal range of motion.  Pulmonary/Chest: Effort normal. No respiratory distress.  Musculoskeletal: Normal range of motion. She exhibits edema. She exhibits no tenderness.  Neurological: She is alert and oriented to person, place, and time. Gait normal.  Skin: Skin is warm and dry. No rash noted. There is erythema. No pallor.  Exam performed today reveal questionable mild phlebitis and left forearm corded veins.  The anterior left forearm also has an approximately 2 cm in diameter area of erythema and mild edema as well.     Psychiatric: Affect normal.  Nursing note and vitals  reviewed.   LABORATORY DATA:. No visits with results within 3 Day(s) from this visit. Latest known visit with results is:  Appointment on 03/07/2015  Component Date Value Ref Range Status  . WBC 03/07/2015 6.1  3.9 - 10.3 10e3/uL Final  . NEUT# 03/07/2015 3.9  1.5 - 6.5 10e3/uL Final  . HGB 03/07/2015 14.6  11.6 - 15.9 g/dL Final  . HCT 03/07/2015 44.3  34.8 - 46.6 % Final  . Platelets 03/07/2015 163  145 - 400 10e3/uL Final  . MCV 03/07/2015 90.8  79.5 - 101.0 fL Final  . MCH 03/07/2015 29.9  25.1 - 34.0 pg Final  . MCHC 03/07/2015 33.0  31.5 - 36.0 g/dL Final  . RBC 03/07/2015 4.88  3.70 - 5.45 10e6/uL Final  . RDW 03/07/2015 13.7  11.2 - 14.5 % Final  . lymph# 03/07/2015 1.6  0.9 - 3.3 10e3/uL Final  . MONO# 03/07/2015 0.5  0.1 - 0.9 10e3/uL Final  . Eosinophils Absolute 03/07/2015 0.1  0.0 - 0.5 10e3/uL Final  . Basophils Absolute 03/07/2015 0.0  0.0 - 0.1 10e3/uL Final  . NEUT% 03/07/2015 63.4  38.4 - 76.8 % Final  . LYMPH% 03/07/2015 26.1  14.0 - 49.7 % Final  . MONO% 03/07/2015 8.6  0.0 - 14.0 % Final  . EOS% 03/07/2015 1.8  0.0 - 7.0 % Final  . BASO% 03/07/2015 0.1  0.0 - 2.0 % Final  . Sodium 03/07/2015 144  136 - 145 mEq/L Final  . Potassium 03/07/2015 4.3  3.5 - 5.1 mEq/L Final  . Chloride 03/07/2015 110* 98 - 109 mEq/L Final  . CO2 03/07/2015 25  22 - 29 mEq/L Final  . Glucose 03/07/2015 92  70 - 140 mg/dl Final  . BUN 03/07/2015 15.5  7.0 - 26.0 mg/dL Final  . Creatinine 03/07/2015 0.9  0.6 - 1.1 mg/dL Final  . Total Bilirubin 03/07/2015 0.58  0.20 - 1.20 mg/dL Final  . Alkaline Phosphatase 03/07/2015 66  40 - 150 U/L Final  . AST 03/07/2015 27  5 - 34 U/L Final  . ALT 03/07/2015 35  0 - 55 U/L Final  . Total Protein 03/07/2015 6.2* 6.4 - 8.3 g/dL Final  . Albumin 03/07/2015 4.0  3.5 - 5.0 g/dL Final  . Calcium 03/07/2015 9.7  8.4 - 10.4 mg/dL Final  . Anion Gap 03/07/2015 8  3 - 11 mEq/L Final  . EGFR 03/07/2015 64* >90 ml/min/1.73 m2 Final   eGFR is  calculated using the CKD-EPI Creatinine Equation (2009)  . LDH 03/07/2015 245  125 - 245 U/L Final  . IgG (Immunoglobin G), Serum 03/07/2015 183* 690 - 1700 mg/dL Final  . IgA 03/07/2015 <7* 69 - 380 mg/dL Final  . IgM, Serum  03/07/2015 7* 52 - 322 mg/dL Final     RADIOGRAPHIC STUDIES: No results found.  ASSESSMENT/PLAN:    Cellulitis Patient received her last IVIG infusion on Monday, 04/09/2015.  She reports developing some erythema, edema, mild warmth, and tenderness to the left forearm site following the infusion.  She states that the entire area is slowly improving.  She denies any recent fevers or chills.  Exam performed today reveal questionable mild phlebitis and left forearm corded veins.  The anterior left forearm also has an approximately 2 cm in diameter area of erythema and mild edema as well.  Most likely, left forearm, status post IVIG infusion is mild to moderate phlebitis; with a component of cellulitis as well.  Patient is allergic to both penicillin and sulfa drugs-so reviewed antibiotic choice for treatment of possible cellulitis with pharmacist.  Will prescribe Keflex antibiotics for treatment of cellulitis today.  Carefully reviewed with patient need to discontinue any antibiotics for any worrisome reaction symptoms whatsoever.  Also, advised patient to go directly to the emergency department if she develops any severe reaction symptoms whatsoever.    Chronic lymphoid leukemia in remission Chronic lymphocytic leukemia remains in remission-her last progress note.  Patient will continue with annual follow-up of her disease.  Hypogammaglobulinemia, acquired Patient received her last IVIG infusion this past Monday, 04/09/2015.  She will continue to receive her IVIG infusions on a monthly basis.  Patient is scheduled to return for IVIG infusion only on 05/07/2015.  She will return on 06/04/2015 for labs, visit, and IVIG infusion.  Patient stated understanding of all  instructions; and was in agreement with this plan of care. The patient knows to call the clinic with any problems, questions or concerns.   Review/collaboration with Dr. Alvy Bimler regarding all aspects of patient's visit today.   Total time spent with patient was 25 minutes;  with greater than 75 percent of that time spent in face to face counseling regarding patient's symptoms,  and coordination of care and follow up.  Disclaimer: This note was dictated with voice recognition software. Similar sounding words can inadvertently be transcribed and may not be corrected upon review.   Drue Second, NP 04/16/2015

## 2015-04-16 NOTE — Progress Notes (Signed)
Addendum:   Left arm photo

## 2015-04-16 NOTE — Telephone Encounter (Signed)
Pt left message stating she had IVIG last week. Has had redness and a knot around site. Has tried ASA and advil.  States vein parallel to site is red.   Called pt to come in to see Selena Lesser, NP to check site

## 2015-04-16 NOTE — Assessment & Plan Note (Signed)
Chronic lymphocytic leukemia remains in remission-her last progress note.  Patient will continue with annual follow-up of her disease.

## 2015-04-16 NOTE — Assessment & Plan Note (Signed)
Patient received her last IVIG infusion on Monday, 04/09/2015.  She reports developing some erythema, edema, mild warmth, and tenderness to the left forearm site following the infusion.  She states that the entire area is slowly improving.  She denies any recent fevers or chills.  Exam performed today reveal questionable mild phlebitis and left forearm corded veins.  The anterior left forearm also has an approximately 2 cm in diameter area of erythema and mild edema as well.  Most likely, left forearm, status post IVIG infusion is mild to moderate phlebitis; with a component of cellulitis as well.  Patient is allergic to both penicillin and sulfa drugs-so reviewed antibiotic choice for treatment of possible cellulitis with pharmacist.  Will prescribe Keflex antibiotics for treatment of cellulitis today.  Carefully reviewed with patient need to discontinue any antibiotics for any worrisome reaction symptoms whatsoever.  Also, advised patient to go directly to the emergency department if she develops any severe reaction symptoms whatsoever.

## 2015-04-26 ENCOUNTER — Telehealth: Payer: Self-pay | Admitting: *Deleted

## 2015-04-26 NOTE — Telephone Encounter (Signed)
S/w pt that the area of phlebitis and cellulitis was better but her vein was tender to touch and movement was tender more distally with a lump. Also she is getting a lump in her armpit she cannot see real well. She is describing it as a boil. She finished her keflex this AM. S/w cyndee bacon and to be prudent we will see the pt at [facility] tomorrow. Called pt back and set up an appt for 930 am.

## 2015-04-26 NOTE — Telephone Encounter (Signed)
Voicemail from patient reporting she "was seen by Selena Lesser a week ago (04-16-2015) about a vein issue in my left arm.  Now there is a different spot that is raised and sore to my left arm.  I have a few days more Keflex antibiotic to take.  Should I be concerned about this?"

## 2015-04-27 ENCOUNTER — Ambulatory Visit (HOSPITAL_BASED_OUTPATIENT_CLINIC_OR_DEPARTMENT_OTHER): Payer: 59 | Admitting: Nurse Practitioner

## 2015-04-27 ENCOUNTER — Other Ambulatory Visit: Payer: Self-pay | Admitting: Hematology and Oncology

## 2015-04-27 ENCOUNTER — Telehealth: Payer: Self-pay | Admitting: *Deleted

## 2015-04-27 ENCOUNTER — Telehealth: Payer: Self-pay

## 2015-04-27 VITALS — BP 150/84 | HR 101 | Temp 98.4°F | Resp 18 | Ht 65.0 in | Wt 209.7 lb

## 2015-04-27 DIAGNOSIS — R599 Enlarged lymph nodes, unspecified: Secondary | ICD-10-CM

## 2015-04-27 DIAGNOSIS — C9111 Chronic lymphocytic leukemia of B-cell type in remission: Secondary | ICD-10-CM | POA: Diagnosis not present

## 2015-04-27 DIAGNOSIS — L03114 Cellulitis of left upper limb: Secondary | ICD-10-CM | POA: Diagnosis not present

## 2015-04-27 DIAGNOSIS — D801 Nonfamilial hypogammaglobulinemia: Secondary | ICD-10-CM | POA: Diagnosis not present

## 2015-04-27 DIAGNOSIS — R591 Generalized enlarged lymph nodes: Secondary | ICD-10-CM

## 2015-04-27 MED ORDER — MOXIFLOXACIN HCL 400 MG PO TABS: 400.0000 mg | ORAL_TABLET | Freq: Every day | ORAL | Status: DC

## 2015-04-27 NOTE — Telephone Encounter (Signed)
S/w pt that Cyndee Berniece Salines is out of office this AM and we will need to r/s. Pt can come in at 230 pm.

## 2015-04-27 NOTE — Telephone Encounter (Signed)
Call in doxycyline 100 mg daily x 14 days Instruct her to take 30 mins before she eats, sitting up right after she takes in Warn her risk of reflux and stomach upset

## 2015-04-27 NOTE — Telephone Encounter (Signed)
Called pt and she said she decided to get the Avelox filled after all.  She decided Dr. Alvy Bimler knew best and she paid for the Avelox, although it was over $200.   Informed pt that Dr. Alvy Bimler did offer a different antibiotic but Pt says she feels she should stick to the Avelox Dr. Alvy Bimler prescribed first because she trusts her medical opinion.

## 2015-04-27 NOTE — Telephone Encounter (Signed)
PT. IS REQUESTING A DIFFERENT ANTIBIOTIC.

## 2015-04-29 ENCOUNTER — Encounter: Payer: Self-pay | Admitting: Nurse Practitioner

## 2015-04-29 DIAGNOSIS — R599 Enlarged lymph nodes, unspecified: Secondary | ICD-10-CM | POA: Insufficient documentation

## 2015-04-29 NOTE — Progress Notes (Signed)
[facility]   HPI: Bianca Parker 69 y.o. female diagnosed with both chronic lymphoid leukemia which is in remission; and hypogammaglobulinemia.  Currently undergoing IVIG infusions.  Patient previously been seen for complaint of mild phlebitis versus cellulitis to her left forearm.  Patient reports that the erythema at that time did completely resolve; and that the corded  veins to the left forearm have greatly improved.  However, recurrence of the erythema to the left forearm has been noted within the past few days.  Patient states she finished the Keflex antibiotics as previously prescribed.  Patient denies any recent fevers or chills.  HPI  ROS  Past Medical History  Diagnosis Date  . Anemia   . CARCINOMA, SKIN, SQUAMOUS CELL   . Allergic rhinitis, cause unspecified   . Chronic lymphoid leukemia in remission 2002 dx, tx 2003  . Dyslipidemia   . Hypogammaglobulinemia, acquired 11/18/2011    Due to CLL and prior chemo  . Hyperlipidemia   . Hepatitis A antibody positive 04/06/2012    Acute diarrheal illness & elevated liver function tests 02/18/12;     Past Surgical History  Procedure Laterality Date  . Abdominal hysterectomy  1966  . Tonsillectomy    . Bunionectomy    . Sigmoidoscopy    . Skin biopsy      has CARCINOMA, SKIN, SQUAMOUS CELL; Chronic lymphoid leukemia in remission; ANEMIA-NOS; Seasonal and perennial allergic rhinitis; RHINOSINUSITIS, CHRONIC; Drug allergy; Dyslipidemia; Hypogammaglobulinemia, acquired; LFT elevation; Cyst of right kidney; Diarrhea; Hepatitis A antibody positive; Hepatitis B antibody positive; Acute bronchitis; Hyperlipidemia; Excessive weight gain; Cellulitis; and Enlarged lymph nodes on her problem list.    is allergic to penicillins; pentamidine; and sulfonamide derivatives.    Medication List       This list is accurate as of: 04/27/15 11:59 PM.  Always use your most recent med list.               CALTRATE 600+D  600-400 MG-UNIT per tablet  Generic drug:  Calcium Carbonate-Vitamin D  Take 1 tablet by mouth daily.     fexofenadine 180 MG tablet  Commonly known as:  ALLEGRA  Take 180 mg by mouth daily.     moxifloxacin 400 MG tablet  Commonly known as:  AVELOX  Take 1 tablet (400 mg total) by mouth daily at 8 pm.     multivitamin tablet  Take 1 tablet by mouth daily.     SYNTHROID PO  Take 25 mcg by mouth daily.     valACYclovir 500 MG tablet  Commonly known as:  VALTREX  Take 500 mg by mouth as needed.         PHYSICAL EXAMINATION  Oncology Vitals 04/27/2015 04/16/2015 04/09/2015 04/09/2015 03/12/2015 03/12/2015 03/12/2015  Height 165 cm 165 cm - - - - -  Weight 95.119 kg 94.53 kg - - - - -  Weight (lbs) 209 lbs 11 oz 208 lbs 6 oz - - - - -  BMI (kg/m2) 34.9 kg/m2 34.68 kg/m2 - - - - -  Temp 98.4 98.4 97.2 97.4 97.1 97.6 98  Pulse 101 89 91 87 92 91 80  Resp _0 Resp (Historical as of 03/25/12) - - - - - - -  SpO2 97 95 - 94 95 - 95  BSA (m2) 2.09 m2 2.08 m2 - - - - -   BP Readings from Last 3 Encounters:  04/27/15 150/84  04/16/15 158/85  04/09/15  141/75    Physical Exam  Constitutional: She is oriented to person, place, and time and well-developed, well-nourished, and in no distress.  HENT:  Head: Normocephalic and atraumatic.  Eyes: Conjunctivae and EOM are normal. Pupils are equal, round, and reactive to light. Right eye exhibits no discharge. Left eye exhibits no discharge. No scleral icterus.  Neck: Normal range of motion.  Pulmonary/Chest: Effort normal. No respiratory distress.  Musculoskeletal: Normal range of motion. She exhibits tenderness. She exhibits no edema.  Left axilla region with palpable, firm mass which is slightly tender with palpation.  There is no surrounding erythema, edema, warmth, or red streaks.  Neurological: She is alert and oriented to person, place, and time. Gait normal.  Skin: Skin is warm and dry. No rash noted. There is  erythema. No pallor.  Left forearm with trace erythema; but no edema and, no warmth, no tenderness, no red streaks.  Corded veins to left forearm have greatly improved.  Psychiatric: Affect normal.  Nursing note and vitals reviewed.   LABORATORY DATA:. No visits with results within 3 Day(s) from this visit. Latest known visit with results is:  Appointment on 03/07/2015  Component Date Value Ref Range Status  . WBC 03/07/2015 6.1  3.9 - 10.3 10e3/uL Final  . NEUT# 03/07/2015 3.9  1.5 - 6.5 10e3/uL Final  . HGB 03/07/2015 14.6  11.6 - 15.9 g/dL Final  . HCT 03/07/2015 44.3  34.8 - 46.6 % Final  . Platelets 03/07/2015 163  145 - 400 10e3/uL Final  . MCV 03/07/2015 90.8  79.5 - 101.0 fL Final  . MCH 03/07/2015 29.9  25.1 - 34.0 pg Final  . MCHC 03/07/2015 33.0  31.5 - 36.0 g/dL Final  . RBC 03/07/2015 4.88  3.70 - 5.45 10e6/uL Final  . RDW 03/07/2015 13.7  11.2 - 14.5 % Final  . lymph# 03/07/2015 1.6  0.9 - 3.3 10e3/uL Final  . MONO# 03/07/2015 0.5  0.1 - 0.9 10e3/uL Final  . Eosinophils Absolute 03/07/2015 0.1  0.0 - 0.5 10e3/uL Final  . Basophils Absolute 03/07/2015 0.0  0.0 - 0.1 10e3/uL Final  . NEUT% 03/07/2015 63.4  38.4 - 76.8 % Final  . LYMPH% 03/07/2015 26.1  14.0 - 49.7 % Final  . MONO% 03/07/2015 8.6  0.0 - 14.0 % Final  . EOS% 03/07/2015 1.8  0.0 - 7.0 % Final  . BASO% 03/07/2015 0.1  0.0 - 2.0 % Final  . Sodium 03/07/2015 144  136 - 145 mEq/L Final  . Potassium 03/07/2015 4.3  3.5 - 5.1 mEq/L Final  . Chloride 03/07/2015 110* 98 - 109 mEq/L Final  . CO2 03/07/2015 25  22 - 29 mEq/L Final  . Glucose 03/07/2015 92  70 - 140 mg/dl Final  . BUN 03/07/2015 15.5  7.0 - 26.0 mg/dL Final  . Creatinine 03/07/2015 0.9  0.6 - 1.1 mg/dL Final  . Total Bilirubin 03/07/2015 0.58  0.20 - 1.20 mg/dL Final  . Alkaline Phosphatase 03/07/2015 66  40 - 150 U/L Final  . AST 03/07/2015 27  5 - 34 U/L Final  . ALT 03/07/2015 35  0 - 55 U/L Final  . Total Protein 03/07/2015 6.2* 6.4 - 8.3  g/dL Final  . Albumin 03/07/2015 4.0  3.5 - 5.0 g/dL Final  . Calcium 03/07/2015 9.7  8.4 - 10.4 mg/dL Final  . Anion Gap 03/07/2015 8  3 - 11 mEq/L Final  . EGFR 03/07/2015 64* >90 ml/min/1.73 m2 Final   eGFR is calculated using  the CKD-EPI Creatinine Equation (2009)  . LDH 03/07/2015 245  125 - 245 U/L Final  . IgG (Immunoglobin G), Serum 03/07/2015 183* 690 - 1700 mg/dL Final  . IgA 03/07/2015 <7* 69 - 380 mg/dL Final  . IgM, Serum 03/07/2015 7* 52 - 322 mg/dL Final  Left forearm:       RADIOGRAPHIC STUDIES: No results found.  ASSESSMENT/PLAN:    Cellulitis Patient previously been seen for complaint of mild phlebitis versus cellulitis to her left forearm.  Patient reports that the erythema at that time did completely resolve; and that the corded  veins to the left forearm have greatly improved.  However, recurrence of the erythema to the left forearm has been noted within the past few days.  Patient states she finished the Keflex antibiotics as previously prescribed.  Left forearm with trace erythema; but no edema, no warmth, no tenderness, and no red streaks.  Also, corded veins previously noted to left forearm have essentially resolved as well.  Patient will be prescribed Avelox for treatment of questionable trace cellulitis versus phlebitis to the left forearm  Chronic lymphoid leukemia in remission Chronic lymphocytic leukemia remains in remission-per last progress note.  Patient will continue with annual follow-up of her disease.    Enlarged lymph nodes Patient reports recent onset left axilla region enlarged lymph node with mild tenderness.  On exam.-It does appear the patient has a palpable, firm mass to her left axilla region.  This mass is slightly tender with palpation as well.  There is no surrounding erythema, edema, warmth, or red streaks.  Reviewed findings with Dr. Alvy Bimler today; will prescribe Avelox antibiotics for treatment of any possible infection.  Also,  will follow up with a left axilla mass during next visit to see if it has improved.  Hypogammaglobulinemia, acquired Patient received her last IVIG infusion this past Monday, 04/09/2015.  She will continue to receive her IVIG infusions on a monthly basis.  Patient will return for labs, follow visit and her next IVIG infusion on 05/07/2015.   Patient stated understanding of all instructions; and was in agreement with this plan of care. The patient knows to call the clinic with any problems, questions or concerns.   Review/collaboration with Dr. Alvy Bimler regarding all aspects of patient's visit today.   Total time spent with patient was 25 minutes;  with greater than 75 percent of that time spent in face to face counseling regarding patient's symptoms,  and coordination of care and follow up.  Disclaimer:This dictation was prepared with Dragon/digital dictation along with [employer]. Any transcriptional errors that result from this process are unintentional.  Drue Second, NP 04/29/2015

## 2015-04-29 NOTE — Assessment & Plan Note (Signed)
Patient received her last IVIG infusion this past Monday, 04/09/2015.  She will continue to receive her IVIG infusions on a monthly basis.  Patient will return for labs, follow visit and her next IVIG infusion on 05/07/2015.

## 2015-04-29 NOTE — Assessment & Plan Note (Signed)
Patient previously been seen for complaint of mild phlebitis versus cellulitis to her left forearm.  Patient reports that the erythema at that time did completely resolve; and that the corded  veins to the left forearm have greatly improved.  However, recurrence of the erythema to the left forearm has been noted within the past few days.  Patient states she finished the Keflex antibiotics as previously prescribed.  Left forearm with trace erythema; but no edema, no warmth, no tenderness, and no red streaks.  Also, corded veins previously noted to left forearm have essentially resolved as well.  Patient will be prescribed Avelox for treatment of questionable trace cellulitis versus phlebitis to the left forearm

## 2015-04-29 NOTE — Assessment & Plan Note (Signed)
Chronic lymphocytic leukemia remains in remission-per last progress note.  Patient will continue with annual follow-up of her disease.

## 2015-04-29 NOTE — Assessment & Plan Note (Signed)
Patient reports recent onset left axilla region enlarged lymph node with mild tenderness.  On exam.-It does appear the patient has a palpable, firm mass to her left axilla region.  This mass is slightly tender with palpation as well.  There is no surrounding erythema, edema, warmth, or red streaks.  Reviewed findings with Dr. Alvy Bimler today; will prescribe Avelox antibiotics for treatment of any possible infection.  Also, will follow up with a left axilla mass during next visit to see if it has improved.

## 2015-05-07 ENCOUNTER — Ambulatory Visit (HOSPITAL_BASED_OUTPATIENT_CLINIC_OR_DEPARTMENT_OTHER): Payer: 59

## 2015-05-07 ENCOUNTER — Ambulatory Visit (HOSPITAL_BASED_OUTPATIENT_CLINIC_OR_DEPARTMENT_OTHER): Payer: 59 | Admitting: Hematology and Oncology

## 2015-05-07 ENCOUNTER — Encounter: Payer: Self-pay | Admitting: Hematology and Oncology

## 2015-05-07 ENCOUNTER — Other Ambulatory Visit (HOSPITAL_BASED_OUTPATIENT_CLINIC_OR_DEPARTMENT_OTHER): Payer: 59

## 2015-05-07 VITALS — BP 118/81 | HR 84 | Temp 98.1°F | Resp 18

## 2015-05-07 VITALS — BP 127/62 | HR 74 | Temp 98.0°F | Resp 18 | Ht 65.0 in | Wt 209.1 lb

## 2015-05-07 DIAGNOSIS — C9111 Chronic lymphocytic leukemia of B-cell type in remission: Secondary | ICD-10-CM

## 2015-05-07 DIAGNOSIS — L03818 Cellulitis of other sites: Secondary | ICD-10-CM

## 2015-05-07 DIAGNOSIS — D801 Nonfamilial hypogammaglobulinemia: Secondary | ICD-10-CM

## 2015-05-07 LAB — CBC WITH DIFFERENTIAL/PLATELET
BASO%: 0.2 % (ref 0.0–2.0)
BASOS ABS: 0 10*3/uL (ref 0.0–0.1)
EOS ABS: 0.1 10*3/uL (ref 0.0–0.5)
EOS%: 2.3 % (ref 0.0–7.0)
HEMATOCRIT: 42.3 % (ref 34.8–46.6)
HEMOGLOBIN: 14.1 g/dL (ref 11.6–15.9)
LYMPH#: 1.8 10*3/uL (ref 0.9–3.3)
LYMPH%: 34.8 % (ref 14.0–49.7)
MCH: 30.2 pg (ref 25.1–34.0)
MCHC: 33.4 g/dL (ref 31.5–36.0)
MCV: 90.4 fL (ref 79.5–101.0)
MONO#: 0.5 10*3/uL (ref 0.1–0.9)
MONO%: 9.9 % (ref 0.0–14.0)
NEUT#: 2.7 10*3/uL (ref 1.5–6.5)
NEUT%: 52.8 % (ref 38.4–76.8)
PLATELETS: 144 10*3/uL — AB (ref 145–400)
RBC: 4.67 10*6/uL (ref 3.70–5.45)
RDW: 13.5 % (ref 11.2–14.5)
WBC: 5.1 10*3/uL (ref 3.9–10.3)

## 2015-05-07 MED ORDER — METHYLPREDNISOLONE SODIUM SUCC 125 MG IJ SOLR
40.0000 mg | Freq: Once | INTRAMUSCULAR | Status: AC
  Administered 2015-05-07: 40 mg via INTRAVENOUS

## 2015-05-07 MED ORDER — DIPHENHYDRAMINE HCL 25 MG PO CAPS
ORAL_CAPSULE | ORAL | Status: AC
  Filled 2015-05-07: qty 2

## 2015-05-07 MED ORDER — ACETAMINOPHEN 325 MG PO TABS
650.0000 mg | ORAL_TABLET | Freq: Once | ORAL | Status: AC
  Administered 2015-05-07: 650 mg via ORAL

## 2015-05-07 MED ORDER — IMMUNE GLOBULIN (HUMAN) 10 GM/100ML IV SOLN
1.0000 g/kg | Freq: Once | INTRAVENOUS | Status: AC
  Administered 2015-05-07: 95 g via INTRAVENOUS
  Filled 2015-05-07: qty 950

## 2015-05-07 MED ORDER — SODIUM CHLORIDE 0.9 % IV SOLN
Freq: Once | INTRAVENOUS | Status: AC
  Administered 2015-05-07: 10:00:00 via INTRAVENOUS

## 2015-05-07 MED ORDER — DIPHENHYDRAMINE HCL 25 MG PO TABS
50.0000 mg | ORAL_TABLET | Freq: Once | ORAL | Status: AC
  Administered 2015-05-07: 50 mg via ORAL
  Filled 2015-05-07: qty 2

## 2015-05-07 MED ORDER — ACETAMINOPHEN 325 MG PO TABS
ORAL_TABLET | ORAL | Status: AC
  Filled 2015-05-07: qty 2

## 2015-05-07 MED ORDER — METHYLPREDNISOLONE SODIUM SUCC 125 MG IJ SOLR
INTRAMUSCULAR | Status: AC
  Filled 2015-05-07: qty 2

## 2015-05-07 NOTE — Assessment & Plan Note (Signed)
She had recent recurrent cellulitis, resolved with antibiotics therapy. I recommend consideration for placement of port.

## 2015-05-07 NOTE — Assessment & Plan Note (Signed)
This is related to long-term side effects from prior chemotherapy. She has recurrent infection with herpesvirus,, bronchitis and infection She is already on chronic treatment with Valtrex daily. She is doing well and will continue IVIG treatment at 1 g/kg every [redacted] weeks along with premedication with Benadryl, Tylenol and Solu-Medrol. The risk, benefit, side effects of IVIG is fully discussed with the patient and she agreed to proceed.

## 2015-05-07 NOTE — Progress Notes (Signed)
Pt refused to be observed post 30 mins after octagam

## 2015-05-07 NOTE — Patient Instructions (Signed)

## 2015-05-07 NOTE — Progress Notes (Signed)
[place] OFFICE PROGRESS NOTE  Patient Care Team: Rowe Clack, MD as PCP - General Deneise Lever, MD as Referring Physician (Pulmonary Disease) Rozetta Nunnery, MD as Referring Physician (Otolaryngology) Milus Banister, MD (Gastroenterology) Arvella Nigh, MD (Obstetrics and Gynecology) Heath Lark, MD as Consulting Physician (Hematology and Oncology) Jari Pigg, MD as Consulting Physician (Dermatology)  SUMMARY OF ONCOLOGIC HISTORY:  She was in one of the first cohorts of patients treated with FCR on protocol at the [facility] at time of her initial diagnosis in July 2002. Technically she had RAI  stage III disease due to anemia but she had no organomegaly or lymphadenopathy on exam. FCR was started in 12/2001, and continued through 06/2002. She achieved a complete hematologic response and,which has been durable now for over 11 years!  She did develop hypogammaglobulinemia. As a result, she gets recurrent bronchitis and sinusitis. In April 2013 she was started her on monthly intravenous immunoglobulin 400 mg per kilogram and this has significantly impacted on the frequency and severity of her infections. This was continued through April of 2014. She has had no new infections since stopping the IVIG but as expected, immunoglobulin levels have fallen to pretreatment levels.   She has a history of her recurrent squamous cell carcinomas of her skin and has had 2 lesions excised from her left tibial area and another 1 excised from the vulvar area. Recently, she had a basal cell carcinoma removed. She has a history of both herpes zoster and herpes simplex infections. Recently, she had abnormal Pap smear and currently on close observation. Recent colposcopy was negative.  She received IVIG treatment for acquired hypogammaglobulinemia but stopped in April 2014 due to sick serum syndrome In July 2016, IVIG is resumed with premedication In August 2016, she  developed cellulitis from venipuncture, resolved with antibiotics  INTERVAL HISTORY: Please see below for problem oriented charting. She have recurrent cellulitis/thrombophlebitis in her left upper extremity, resolved with hot compresses and antibiotics therapy. She have occasional cough in the morning. It is generally nonproductive. She denies other infection  REVIEW OF SYSTEMS:   Constitutional: Denies fevers, chills or abnormal weight loss Eyes: Denies blurriness of vision Ears, nose, mouth, throat, and face: Denies mucositis or sore throat Respiratory: Denies cough, dyspnea or wheezes Cardiovascular: Denies palpitation, chest discomfort or lower extremity swelling Gastrointestinal:  Denies nausea, heartburn or change in bowel habits Lymphatics: Denies new lymphadenopathy or easy bruising Neurological:Denies numbness, tingling or new weaknesses Behavioral/Psych: Mood is stable, no new changes  All other systems were reviewed with the patient and are negative.  I have reviewed the past medical history, past surgical history, social history and family history with the patient and they are unchanged from previous note.  ALLERGIES:  is allergic to penicillins; pentamidine; and sulfonamide derivatives.  MEDICATIONS:  Current Outpatient Prescriptions  Medication Sig Dispense Refill  . Calcium Carbonate-Vitamin D (CALTRATE 600+D) 600-400 MG-UNIT per tablet Take 1 tablet by mouth daily.    . fexofenadine (ALLEGRA) 180 MG tablet Take 180 mg by mouth daily.      . Levothyroxine Sodium (SYNTHROID PO) Take 25 mcg by mouth daily.    . Multiple Vitamin (MULTIVITAMIN) tablet Take 1 tablet by mouth daily.    . valACYclovir (VALTREX) 500 MG tablet Take 500 mg by mouth as needed.     No current facility-administered medications for this visit.    PHYSICAL EXAMINATION: ECOG PERFORMANCE STATUS: 1 - Symptomatic but completely ambulatory  Filed  Vitals:   05/07/15 0843  BP: 127/62  Pulse: 74   Temp: 98 F (36.7 C)  Resp: 18   Filed Weights   05/07/15 0843  Weight: 209 lb 1.6 oz (94.847 kg)    GENERAL:alert, no distress and comfortable SKIN: skin color, texture, turgor are normal, no rashes or significant lesions EYES: normal, Conjunctiva are pink and non-injected, sclera clear Musculoskeletal:no cyanosis of digits and no clubbing  NEURO: alert & oriented x 3 with fluent speech, no focal motor/sensory deficits  LABORATORY DATA:  I have reviewed the data as listed    Component Value Date/Time   NA 144 03/07/2015 0756   NA 141 05/22/2014 0737   K 4.3 03/07/2015 0756   K 4.3 05/22/2014 0737   CL 104 05/22/2014 0737   CL 109* 01/20/2013 0801   CO2 25 03/07/2015 0756   CO2 30 05/22/2014 0737   GLUCOSE 92 03/07/2015 0756   GLUCOSE 80 05/22/2014 0737   GLUCOSE 89 01/20/2013 0801   BUN 15.5 03/07/2015 0756   BUN 19 05/22/2014 0737   CREATININE 0.9 03/07/2015 0756   CREATININE 1.0 05/22/2014 0737   CALCIUM 9.7 03/07/2015 0756   CALCIUM 9.5 05/22/2014 0737   PROT 6.2* 03/07/2015 0756   PROT 6.1 05/22/2014 0737   ALBUMIN 4.0 03/07/2015 0756   ALBUMIN 4.4 05/22/2014 0737   AST 27 03/07/2015 0756   AST 33 05/22/2014 0737   ALT 35 03/07/2015 0756   ALT 40* 05/22/2014 0737   ALKPHOS 66 03/07/2015 0756   ALKPHOS 40 05/22/2014 0737   BILITOT 0.58 03/07/2015 0756   BILITOT 0.9 05/22/2014 0737   GFRNONAA 57* 04/18/2009 0943   GFRAA  04/18/2009 0943    >60        The eGFR has been calculated using the MDRD equation. This calculation has not been validated in all clinical situations. eGFR's persistently <60 mL/min signify possible Chronic Kidney Disease.    No results found for: SPEP, UPEP  Lab Results  Component Value Date   WBC 5.1 05/07/2015   NEUTROABS 2.7 05/07/2015   HGB 14.1 05/07/2015   HCT 42.3 05/07/2015   MCV 90.4 05/07/2015   PLT 144* 05/07/2015      Chemistry      Component Value Date/Time   NA 144 03/07/2015 0756   NA 141 05/22/2014  0737   K 4.3 03/07/2015 0756   K 4.3 05/22/2014 0737   CL 104 05/22/2014 0737   CL 109* 01/20/2013 0801   CO2 25 03/07/2015 0756   CO2 30 05/22/2014 0737   BUN 15.5 03/07/2015 0756   BUN 19 05/22/2014 0737   CREATININE 0.9 03/07/2015 0756   CREATININE 1.0 05/22/2014 0737   GLU 92 06/17/2013      Component Value Date/Time   CALCIUM 9.7 03/07/2015 0756   CALCIUM 9.5 05/22/2014 0737   ALKPHOS 66 03/07/2015 0756   ALKPHOS 40 05/22/2014 0737   AST 27 03/07/2015 0756   AST 33 05/22/2014 0737   ALT 35 03/07/2015 0756   ALT 40* 05/22/2014 0737   BILITOT 0.58 03/07/2015 0756   BILITOT 0.9 05/22/2014 0737     ASSESSMENT & PLAN:  Chronic lymphoid leukemia in remission Clinically, she remained in remission. Continues annual follow-up with history, physical examination and blood work.  Hypogammaglobulinemia, acquired This is related to long-term side effects from prior chemotherapy. She has recurrent infection with herpesvirus,, bronchitis and infection She is already on chronic treatment with Valtrex daily. She is doing well and will  continue IVIG treatment at 1 g/kg every [redacted] weeks along with premedication with Benadryl, Tylenol and Solu-Medrol. The risk, benefit, side effects of IVIG is fully discussed with the patient and she agreed to proceed.  Cellulitis She had recent recurrent cellulitis, resolved with antibiotics therapy. I recommend consideration for placement of port.   Orders Placed This Encounter  Procedures  . Comprehensive metabolic panel    Standing Status: Future     Number of Occurrences:      Standing Expiration Date: 06/10/2016  . CBC with Differential/Platelet    Standing Status: Future     Number of Occurrences:      Standing Expiration Date: 06/10/2016   All questions were answered. The patient knows to call the clinic with any problems, questions or concerns. No barriers to learning was detected. I spent 15 minutes counseling the patient face to face.  The total time spent in the appointment was 20 minutes and more than 50% was on counseling and review of test results     [facility], [place], MD 05/07/2015 3:30 PM

## 2015-05-07 NOTE — Assessment & Plan Note (Signed)
Clinically, she remained in remission. Continues annual follow-up with history, physical examination and blood work.  

## 2015-05-23 ENCOUNTER — Telehealth: Payer: Self-pay | Admitting: *Deleted

## 2015-05-23 ENCOUNTER — Telehealth: Payer: Self-pay

## 2015-05-23 NOTE — Telephone Encounter (Signed)
Dont worry about lab I scheduled the appt

## 2015-05-23 NOTE — Telephone Encounter (Signed)
She needs to be seen Tomorrow 9 or 1045

## 2015-05-23 NOTE — Telephone Encounter (Signed)
Instructed pt to come in tomorrow morning at 9 am to see Dr. Alvy Bimler to assess her cold/cough symptoms.   She verbalized understanding.

## 2015-05-23 NOTE — Telephone Encounter (Signed)
Pt reports cold for last 10 days - has been getting better but still fatigued.  She has a productive cough - clear or white sputum and post nasal drip - mostly clear/white with occasional green sputum. Pt denies temps, nausea, vomiting, diarrhea, constipation.  Pt is concerned she may be developing infection.  Pt wants to remind clinic that she is allergic to penicillin and sulfa, and that z-packs don't work very well for her.  Let pt know I would forward this message and that Dr. Calton Dach desk nurse would call her.  She voiced understanding.   Message routed to Dr. Alvy Bimler and MD desk RN for further action.

## 2015-05-24 ENCOUNTER — Encounter: Payer: Self-pay | Admitting: Hematology and Oncology

## 2015-05-24 ENCOUNTER — Ambulatory Visit (HOSPITAL_BASED_OUTPATIENT_CLINIC_OR_DEPARTMENT_OTHER): Payer: 59 | Admitting: Hematology and Oncology

## 2015-05-24 VITALS — BP 137/81 | HR 96 | Temp 98.3°F | Resp 18 | Ht 65.0 in | Wt 209.8 lb

## 2015-05-24 DIAGNOSIS — D839 Common variable immunodeficiency, unspecified: Secondary | ICD-10-CM | POA: Diagnosis not present

## 2015-05-24 DIAGNOSIS — J328 Other chronic sinusitis: Secondary | ICD-10-CM | POA: Diagnosis not present

## 2015-05-24 DIAGNOSIS — D801 Nonfamilial hypogammaglobulinemia: Secondary | ICD-10-CM

## 2015-05-24 MED ORDER — DOXYCYCLINE HYCLATE 100 MG PO TABS: 100.0000 mg | ORAL_TABLET | Freq: Two times a day (BID) | ORAL | Status: DC

## 2015-05-24 NOTE — Progress Notes (Signed)
[place] OFFICE PROGRESS NOTE  Patient Care Team: Rowe Clack, MD as PCP - General Deneise Lever, MD as Referring Physician (Pulmonary Disease) Rozetta Nunnery, MD as Referring Physician (Otolaryngology) Milus Banister, MD (Gastroenterology) Arvella Nigh, MD (Obstetrics and Gynecology) Heath Lark, MD as Consulting Physician (Hematology and Oncology) Jari Pigg, MD as Consulting Physician (Dermatology)  SUMMARY OF ONCOLOGIC HISTORY:  She was in one of the first cohorts of patients treated with FCR on protocol at the [facility] at time of her initial diagnosis in July 2002. Technically she had RAI  stage III disease due to anemia but she had no organomegaly or lymphadenopathy on exam. FCR was started in 12/2001, and continued through 06/2002. She achieved a complete hematologic response and,which has been durable now for over 11 years!  She did develop hypogammaglobulinemia. As a result, she gets recurrent bronchitis and sinusitis. In April 2013 she was started her on monthly intravenous immunoglobulin 400 mg per kilogram and this has significantly impacted on the frequency and severity of her infections. This was continued through April of 2014. She has had no new infections since stopping the IVIG but as expected, immunoglobulin levels have fallen to pretreatment levels.   She has a history of her recurrent squamous cell carcinomas of her skin and has had 2 lesions excised from her left tibial area and another 1 excised from the vulvar area. Recently, she had a basal cell carcinoma removed. She has a history of both herpes zoster and herpes simplex infections. Recently, she had abnormal Pap smear and currently on close observation. Recent colposcopy was negative.  She received IVIG treatment for acquired hypogammaglobulinemia but stopped in April 2014 due to sick serum syndrome In July 2016, IVIG is resumed with premedication In August 2016, she  developed cellulitis from venipuncture, resolved with antibiotics  INTERVAL HISTORY: Please see below for problem oriented charting. She is seen urgently today because she had another bout of infection. She complained of sinus consistent ingestion, nasal drainage and productive greenish sputum. She denies fever or chills. She has mild sore throat. Denies any diarrhea, skin infection or dysuria.  REVIEW OF SYSTEMS:   Eyes: Denies blurriness of vision Cardiovascular: Denies palpitation, chest discomfort or lower extremity swelling Gastrointestinal:  Denies nausea, heartburn or change in bowel habits Skin: Denies abnormal skin rashes Lymphatics: Denies new lymphadenopathy or easy bruising Neurological:Denies numbness, tingling or new weaknesses Behavioral/Psych: Mood is stable, no new changes  All other systems were reviewed with the patient and are negative.  I have reviewed the past medical history, past surgical history, social history and family history with the patient and they are unchanged from previous note.  ALLERGIES:  is allergic to penicillins; pentamidine; and sulfonamide derivatives.  MEDICATIONS:  Current Outpatient Prescriptions  Medication Sig Dispense Refill  . Calcium Carbonate-Vitamin D (CALTRATE 600+D) 600-400 MG-UNIT per tablet Take 1 tablet by mouth daily.    . fexofenadine (ALLEGRA) 180 MG tablet Take 180 mg by mouth daily.      . Levothyroxine Sodium (SYNTHROID PO) Take 25 mcg by mouth daily.    . Multiple Vitamin (MULTIVITAMIN) tablet Take 1 tablet by mouth daily.    . valACYclovir (VALTREX) 500 MG tablet Take 500 mg by mouth as needed.    . doxycycline (VIBRA-TABS) 100 MG tablet Take 1 tablet (100 mg total) by mouth 2 (two) times daily. 28 tablet 0   No current facility-administered medications for this visit.    PHYSICAL  EXAMINATION: ECOG PERFORMANCE STATUS: 1 - Symptomatic but completely ambulatory  Filed Vitals:   05/24/15 0835  BP: 137/81  Pulse:  96  Temp: 98.3 F (36.8 C)  Resp: 18   Filed Weights   05/24/15 0835  Weight: 209 lb 12.8 oz (95.165 kg)    GENERAL:alert, no distress and comfortable SKIN: skin color, texture, turgor are normal, no rashes or significant lesions EYES: normal, Conjunctiva are pink and non-injected, sclera clear OROPHARYNX:no exudate, no erythema and lips, buccal mucosa, and tongue normal  NECK: supple, thyroid normal size, non-tender, without nodularity LYMPH:  no palpable lymphadenopathy in the cervical, axillary or inguinal LUNGS: clear to auscultation and percussion with normal breathing effort HEART: regular rate & rhythm and no murmurs and no lower extremity edema ABDOMEN:abdomen soft, non-tender and normal bowel sounds Musculoskeletal:no cyanosis of digits and no clubbing  NEURO: alert & oriented x 3 with fluent speech, no focal motor/sensory deficits  LABORATORY DATA:  I have reviewed the data as listed    Component Value Date/Time   NA 144 03/07/2015 0756   NA 141 05/22/2014 0737   K 4.3 03/07/2015 0756   K 4.3 05/22/2014 0737   CL 104 05/22/2014 0737   CL 109* 01/20/2013 0801   CO2 25 03/07/2015 0756   CO2 30 05/22/2014 0737   GLUCOSE 92 03/07/2015 0756   GLUCOSE 80 05/22/2014 0737   GLUCOSE 89 01/20/2013 0801   BUN 15.5 03/07/2015 0756   BUN 19 05/22/2014 0737   CREATININE 0.9 03/07/2015 0756   CREATININE 1.0 05/22/2014 0737   CALCIUM 9.7 03/07/2015 0756   CALCIUM 9.5 05/22/2014 0737   PROT 6.2* 03/07/2015 0756   PROT 6.1 05/22/2014 0737   ALBUMIN 4.0 03/07/2015 0756   ALBUMIN 4.4 05/22/2014 0737   AST 27 03/07/2015 0756   AST 33 05/22/2014 0737   ALT 35 03/07/2015 0756   ALT 40* 05/22/2014 0737   ALKPHOS 66 03/07/2015 0756   ALKPHOS 40 05/22/2014 0737   BILITOT 0.58 03/07/2015 0756   BILITOT 0.9 05/22/2014 0737   GFRNONAA 57* 04/18/2009 0943   GFRAA  04/18/2009 0943    >60        The eGFR has been calculated using the MDRD equation. This calculation has not  been validated in all clinical situations. eGFR's persistently <60 mL/min signify possible Chronic Kidney Disease.    No results found for: SPEP, UPEP  Lab Results  Component Value Date   WBC 5.1 05/07/2015   NEUTROABS 2.7 05/07/2015   HGB 14.1 05/07/2015   HCT 42.3 05/07/2015   MCV 90.4 05/07/2015   PLT 144* 05/07/2015      Chemistry      Component Value Date/Time   NA 144 03/07/2015 0756   NA 141 05/22/2014 0737   K 4.3 03/07/2015 0756   K 4.3 05/22/2014 0737   CL 104 05/22/2014 0737   CL 109* 01/20/2013 0801   CO2 25 03/07/2015 0756   CO2 30 05/22/2014 0737   BUN 15.5 03/07/2015 0756   BUN 19 05/22/2014 0737   CREATININE 0.9 03/07/2015 0756   CREATININE 1.0 05/22/2014 0737   GLU 92 06/17/2013      Component Value Date/Time   CALCIUM 9.7 03/07/2015 0756   CALCIUM 9.5 05/22/2014 0737   ALKPHOS 66 03/07/2015 0756   ALKPHOS 40 05/22/2014 0737   AST 27 03/07/2015 0756   AST 33 05/22/2014 0737   ALT 35 03/07/2015 0756   ALT 40* 05/22/2014 0737   BILITOT 0.58  03/07/2015 0756   BILITOT 0.9 05/22/2014 0737     ASSESSMENT & PLAN:  RHINOSINUSITIS, CHRONIC She have history of recurrent cold symptoms/sinusitis. Recently, she had upper respiratory track/sinus congestion, cough and productive greenish sputum for the last 11-12 days without resolution. She is afraid she is going to get another bacterial infection. I gave her prescription of doxycycline to hang onto. If she does not feel well and continues to get worse, she has my permission to restart doxycycline. The patient has multiple drug allergies and recurrent infection. Despite restarting IVIG treatment, she would still be at risk of getting infection. I recommend she follows her pulmonologist closely for further discussion about future plan for recurrent infection.  Hypogammaglobulinemia, acquired This is related to long-term side effects from prior chemotherapy. She has recurrent infection with herpesvirus,  bronchitis and skin infections She is already on chronic treatment with Valtrex daily. She is doing well and will continue IVIG treatment at 1 g/kg every [redacted] weeks along with premedication with Benadryl, Tylenol and Solu-Medrol. The risk, benefit, side effects of IVIG is fully discussed with the patient and she agreed to proceed.     No orders of the defined types were placed in this encounter.   All questions were answered. The patient knows to call the clinic with any problems, questions or concerns. No barriers to learning was detected. I spent 15 minutes counseling the patient face to face. The total time spent in the appointment was 20 minutes and more than 50% was on counseling and review of test results     [facility], [place], MD 05/24/2015 3:49 PM

## 2015-05-24 NOTE — Assessment & Plan Note (Signed)
She have history of recurrent cold symptoms/sinusitis. Recently, she had upper respiratory track/sinus congestion, cough and productive greenish sputum for the last 11-12 days without resolution. She is afraid she is going to get another bacterial infection. I gave her prescription of doxycycline to hang onto. If she does not feel well and continues to get worse, she has my permission to restart doxycycline. The patient has multiple drug allergies and recurrent infection. Despite restarting IVIG treatment, she would still be at risk of getting infection. I recommend she follows her pulmonologist closely for further discussion about future plan for recurrent infection.

## 2015-05-24 NOTE — Assessment & Plan Note (Signed)
This is related to long-term side effects from prior chemotherapy. She has recurrent infection with herpesvirus, bronchitis and skin infections She is already on chronic treatment with Valtrex daily. She is doing well and will continue IVIG treatment at 1 g/kg every [redacted] weeks along with premedication with Benadryl, Tylenol and Solu-Medrol. The risk, benefit, side effects of IVIG is fully discussed with the patient and she agreed to proceed.

## 2015-05-28 ENCOUNTER — Other Ambulatory Visit: Payer: Self-pay

## 2015-05-28 DIAGNOSIS — Z1231 Encounter for screening mammogram for malignant neoplasm of breast: Secondary | ICD-10-CM

## 2015-06-04 ENCOUNTER — Encounter: Payer: Self-pay | Admitting: Hematology and Oncology

## 2015-06-04 ENCOUNTER — Ambulatory Visit (HOSPITAL_BASED_OUTPATIENT_CLINIC_OR_DEPARTMENT_OTHER): Payer: 59

## 2015-06-04 ENCOUNTER — Other Ambulatory Visit: Payer: Self-pay | Admitting: Hematology and Oncology

## 2015-06-04 ENCOUNTER — Other Ambulatory Visit (HOSPITAL_BASED_OUTPATIENT_CLINIC_OR_DEPARTMENT_OTHER): Payer: 59

## 2015-06-04 ENCOUNTER — Telehealth: Payer: Self-pay | Admitting: Hematology and Oncology

## 2015-06-04 ENCOUNTER — Ambulatory Visit (HOSPITAL_BASED_OUTPATIENT_CLINIC_OR_DEPARTMENT_OTHER): Payer: 59 | Admitting: Hematology and Oncology

## 2015-06-04 VITALS — BP 155/83 | HR 102 | Temp 98.2°F | Resp 20 | Ht 65.0 in | Wt 210.4 lb

## 2015-06-04 VITALS — BP 133/59 | HR 84 | Temp 98.8°F | Resp 18

## 2015-06-04 DIAGNOSIS — C9111 Chronic lymphocytic leukemia of B-cell type in remission: Secondary | ICD-10-CM

## 2015-06-04 DIAGNOSIS — R635 Abnormal weight gain: Secondary | ICD-10-CM

## 2015-06-04 DIAGNOSIS — D801 Nonfamilial hypogammaglobulinemia: Secondary | ICD-10-CM

## 2015-06-04 DIAGNOSIS — J329 Chronic sinusitis, unspecified: Secondary | ICD-10-CM

## 2015-06-04 DIAGNOSIS — Z23 Encounter for immunization: Secondary | ICD-10-CM

## 2015-06-04 DIAGNOSIS — J328 Other chronic sinusitis: Secondary | ICD-10-CM

## 2015-06-04 LAB — CBC WITH DIFFERENTIAL/PLATELET
BASO%: 0.7 % (ref 0.0–2.0)
BASOS ABS: 0.1 10*3/uL (ref 0.0–0.1)
EOS ABS: 0.1 10*3/uL (ref 0.0–0.5)
EOS%: 1.1 % (ref 0.0–7.0)
HEMATOCRIT: 42.6 % (ref 34.8–46.6)
HEMOGLOBIN: 14.4 g/dL (ref 11.6–15.9)
LYMPH%: 29.2 % (ref 14.0–49.7)
MCH: 29.9 pg (ref 25.1–34.0)
MCHC: 33.7 g/dL (ref 31.5–36.0)
MCV: 88.8 fL (ref 79.5–101.0)
MONO#: 0.4 10*3/uL (ref 0.1–0.9)
MONO%: 6.4 % (ref 0.0–14.0)
NEUT#: 4.4 10*3/uL (ref 1.5–6.5)
NEUT%: 62.6 % (ref 38.4–76.8)
PLATELETS: 178 10*3/uL (ref 145–400)
RBC: 4.8 10*6/uL (ref 3.70–5.45)
RDW: 13.5 % (ref 11.2–14.5)
WBC: 7.1 10*3/uL (ref 3.9–10.3)
lymph#: 2.1 10*3/uL (ref 0.9–3.3)

## 2015-06-04 LAB — COMPREHENSIVE METABOLIC PANEL (CC13)
ALT: 33 U/L (ref 0–55)
ANION GAP: 9 meq/L (ref 3–11)
AST: 34 U/L (ref 5–34)
Albumin: 3.7 g/dL (ref 3.5–5.0)
Alkaline Phosphatase: 51 U/L (ref 40–150)
BUN: 18.8 mg/dL (ref 7.0–26.0)
CHLORIDE: 110 meq/L — AB (ref 98–109)
CO2: 21 meq/L — AB (ref 22–29)
CREATININE: 1.1 mg/dL (ref 0.6–1.1)
Calcium: 9.4 mg/dL (ref 8.4–10.4)
EGFR: 51 mL/min/{1.73_m2} — ABNORMAL LOW (ref >90)
Glucose: 120 mg/dl (ref 70–140)
Potassium: 3.8 mEq/L (ref 3.5–5.1)
Sodium: 140 mEq/L (ref 136–145)
Total Bilirubin: 0.42 mg/dL (ref 0.20–1.20)
Total Protein: 7.1 g/dL (ref 6.4–8.3)

## 2015-06-04 LAB — TSH CHCC: TSH: 2.687 m[IU]/L (ref 0.308–3.960)

## 2015-06-04 MED ORDER — DIPHENHYDRAMINE HCL 25 MG PO TABS
50.0000 mg | ORAL_TABLET | Freq: Once | ORAL | Status: AC
  Administered 2015-06-04: 50 mg via ORAL
  Filled 2015-06-04: qty 2

## 2015-06-04 MED ORDER — SODIUM CHLORIDE 0.9 % IV SOLN
Freq: Once | INTRAVENOUS | Status: AC
  Administered 2015-06-04: 10:00:00 via INTRAVENOUS

## 2015-06-04 MED ORDER — ACETAMINOPHEN 325 MG PO TABS
ORAL_TABLET | ORAL | Status: AC
  Filled 2015-06-04: qty 2

## 2015-06-04 MED ORDER — METHYLPREDNISOLONE SODIUM SUCC 125 MG IJ SOLR
INTRAMUSCULAR | Status: AC
Start: 2015-06-04 — End: 2015-06-04
  Filled 2015-06-04: qty 2

## 2015-06-04 MED ORDER — DIPHENHYDRAMINE HCL 25 MG PO CAPS
ORAL_CAPSULE | ORAL | Status: AC
  Filled 2015-06-04: qty 2

## 2015-06-04 MED ORDER — METHYLPREDNISOLONE SODIUM SUCC 125 MG IJ SOLR
40.0000 mg | Freq: Once | INTRAMUSCULAR | Status: AC
  Administered 2015-06-04: 40 mg via INTRAVENOUS

## 2015-06-04 MED ORDER — ACETAMINOPHEN 325 MG PO TABS
650.0000 mg | ORAL_TABLET | Freq: Once | ORAL | Status: AC
  Administered 2015-06-04: 650 mg via ORAL

## 2015-06-04 MED ORDER — IMMUNE GLOBULIN (HUMAN) 20 GM/200ML IV SOLN
1.0000 g/kg | Freq: Once | INTRAVENOUS | Status: AC
  Administered 2015-06-04: 95 g via INTRAVENOUS
  Filled 2015-06-04: qty 600

## 2015-06-04 MED ORDER — INFLUENZA VAC SPLIT QUAD 0.5 ML IM SUSY
0.5000 mL | PREFILLED_SYRINGE | Freq: Once | INTRAMUSCULAR | Status: AC
  Administered 2015-06-04: 0.5 mL via INTRAMUSCULAR
  Filled 2015-06-04: qty 0.5

## 2015-06-04 NOTE — Progress Notes (Signed)
[place] OFFICE PROGRESS NOTE  Patient Care Team: Rowe Clack, MD as PCP - General Deneise Lever, MD as Referring Physician (Pulmonary Disease) Rozetta Nunnery, MD as Referring Physician (Otolaryngology) Milus Banister, MD (Gastroenterology) Arvella Nigh, MD (Obstetrics and Gynecology) Heath Lark, MD as Consulting Physician (Hematology and Oncology) Jari Pigg, MD as Consulting Physician (Dermatology)  SUMMARY OF ONCOLOGIC HISTORY:  She was in one of the first cohorts of patients treated with FCR on protocol at the [facility] at time of her initial diagnosis in July 2002. Technically she had RAI  stage III disease due to anemia but she had no organomegaly or lymphadenopathy on exam. FCR was started in 12/2001, and continued through 06/2002. She achieved a complete hematologic response and,which has been durable now for over 11 years!  She did develop hypogammaglobulinemia. As a result, she gets recurrent bronchitis and sinusitis. In April 2013 she was started her on monthly intravenous immunoglobulin 400 mg per kilogram and this has significantly impacted on the frequency and severity of her infections. This was continued through April of 2014. She has had no new infections since stopping the IVIG but as expected, immunoglobulin levels have fallen to pretreatment levels.   She has a history of her recurrent squamous cell carcinomas of her skin and has had 2 lesions excised from her left tibial area and another 1 excised from the vulvar area. Recently, she had a basal cell carcinoma removed. She has a history of both herpes zoster and herpes simplex infections. Recently, she had abnormal Pap smear and currently on close observation. Recent colposcopy was negative.  She received IVIG treatment for acquired hypogammaglobulinemia but stopped in April 2014 due to sick serum syndrome In July 2016, IVIG is resumed with premedication In August 2016, she  developed cellulitis from venipuncture, resolved with antibiotics  INTERVAL HISTORY: Please see below for problem oriented charting. She returns for further follow-up. She denies recent fevers or chills. She continues to have chronic sinus congestion but they are improving. She did not start the antibiotics that was prescribed to her recently. She continues to have poor stamina due to recent weight gain. She have frequent heartburn/reflux  REVIEW OF SYSTEMS:   Constitutional: Denies fevers, chills or abnormal weight loss Eyes: Denies blurriness of vision Ears, nose, mouth, throat, and face: Denies mucositis or sore throat Respiratory: Denies cough, dyspnea or wheezes Cardiovascular: Denies palpitation, chest discomfort or lower extremity swelling Skin: Denies abnormal skin rashes Lymphatics: Denies new lymphadenopathy or easy bruising Neurological:Denies numbness, tingling or new weaknesses Behavioral/Psych: Mood is stable, no new changes  All other systems were reviewed with the patient and are negative.  I have reviewed the past medical history, past surgical history, social history and family history with the patient and they are unchanged from previous note.  ALLERGIES:  is allergic to penicillins; pentamidine; and sulfonamide derivatives.  MEDICATIONS:  Current Outpatient Prescriptions  Medication Sig Dispense Refill  . Calcium Carbonate-Vitamin D (CALTRATE 600+D) 600-400 MG-UNIT per tablet Take 1 tablet by mouth daily.    Marland Kitchen dextromethorphan-guaiFENesin (MUCINEX DM) 30-600 MG 12hr tablet Take 1 tablet by mouth at bedtime as needed for cough.    . fexofenadine (ALLEGRA) 180 MG tablet Take 180 mg by mouth daily.      Marland Kitchen guaiFENesin (MUCINEX) 600 MG 12 hr tablet Take by mouth daily as needed.    . Levothyroxine Sodium (SYNTHROID PO) Take 25 mcg by mouth daily.    . Multiple  Vitamin (MULTIVITAMIN) tablet Take 1 tablet by mouth daily.    . valACYclovir (VALTREX) 500 MG tablet Take  500 mg by mouth as needed.    . doxycycline (VIBRA-TABS) 100 MG tablet Take 1 tablet (100 mg total) by mouth 2 (two) times daily. (Patient not taking: Reported on 06/04/2015) 28 tablet 0   Current Facility-Administered Medications  Medication Dose Route Frequency Provider Last Rate Last Dose  . Influenza vac split quadrivalent PF (FLUARIX) injection 0.5 mL  0.5 mL Intramuscular Once Ni Gorsuch, MD        PHYSICAL EXAMINATION: ECOG PERFORMANCE STATUS: 1 - Symptomatic but completely ambulatory  Filed Vitals:   06/04/15 0840  BP: 155/83  Pulse: 102  Temp: 98.2 F (36.8 C)  Resp: 20   Filed Weights   06/04/15 0840  Weight: 210 lb 6.4 oz (95.437 kg)    GENERAL:alert, no distress and comfortable. She is morbidly obese SKIN: skin color, texture, turgor are normal, no rashes or significant lesions EYES: normal, Conjunctiva are pink and non-injected, sclera clear OROPHARYNX:no exudate, no erythema and lips, buccal mucosa, and tongue normal  NECK: supple, thyroid normal size, non-tender, without nodularity LYMPH:  no palpable lymphadenopathy in the cervical, axillary or inguinal LUNGS: clear to auscultation and percussion with normal breathing effort HEART: regular rate & rhythm and no murmurs and no lower extremity edema ABDOMEN:abdomen soft, non-tender and normal bowel sounds Musculoskeletal:no cyanosis of digits and no clubbing  NEURO: alert & oriented x 3 with fluent speech, no focal motor/sensory deficits  LABORATORY DATA:  I have reviewed the data as listed    Component Value Date/Time   NA 140 06/04/2015 0829   NA 141 05/22/2014 0737   K 3.8 06/04/2015 0829   K 4.3 05/22/2014 0737   CL 104 05/22/2014 0737   CL 109* 01/20/2013 0801   CO2 21* 06/04/2015 0829   CO2 30 05/22/2014 0737   GLUCOSE 120 06/04/2015 0829   GLUCOSE 80 05/22/2014 0737   GLUCOSE 89 01/20/2013 0801   BUN 18.8 06/04/2015 0829   BUN 19 05/22/2014 0737   CREATININE 1.1 06/04/2015 0829   CREATININE  1.0 05/22/2014 0737   CALCIUM 9.4 06/04/2015 0829   CALCIUM 9.5 05/22/2014 0737   PROT 7.1 06/04/2015 0829   PROT 6.1 05/22/2014 0737   ALBUMIN 3.7 06/04/2015 0829   ALBUMIN 4.4 05/22/2014 0737   AST 34 06/04/2015 0829   AST 33 05/22/2014 0737   ALT 33 06/04/2015 0829   ALT 40* 05/22/2014 0737   ALKPHOS 51 06/04/2015 0829   ALKPHOS 40 05/22/2014 0737   BILITOT 0.42 06/04/2015 0829   BILITOT 0.9 05/22/2014 0737   GFRNONAA 57* 04/18/2009 0943   GFRAA  04/18/2009 0943    >60        The eGFR has been calculated using the MDRD equation. This calculation has not been validated in all clinical situations. eGFR's persistently <60 mL/min signify possible Chronic Kidney Disease.    No results found for: SPEP, UPEP  Lab Results  Component Value Date   WBC 7.1 06/04/2015   NEUTROABS 4.4 06/04/2015   HGB 14.4 06/04/2015   HCT 42.6 06/04/2015   MCV 88.8 06/04/2015   PLT 178 06/04/2015      Chemistry      Component Value Date/Time   NA 140 06/04/2015 0829   NA 141 05/22/2014 0737   K 3.8 06/04/2015 0829   K 4.3 05/22/2014 0737   CL 104 05/22/2014 0737   CL 109* 01/20/2013 0801     CO2 21* 06/04/2015 0829   CO2 30 05/22/2014 0737   BUN 18.8 06/04/2015 0829   BUN 19 05/22/2014 0737   CREATININE 1.1 06/04/2015 0829   CREATININE 1.0 05/22/2014 0737   GLU 92 06/17/2013      Component Value Date/Time   CALCIUM 9.4 06/04/2015 0829   CALCIUM 9.5 05/22/2014 0737   ALKPHOS 51 06/04/2015 0829   ALKPHOS 40 05/22/2014 0737   AST 34 06/04/2015 0829   AST 33 05/22/2014 0737   ALT 33 06/04/2015 0829   ALT 40* 05/22/2014 0737   BILITOT 0.42 06/04/2015 0829   BILITOT 0.9 05/22/2014 0737      ASSESSMENT & PLAN:  Chronic lymphoid leukemia in remission Clinically, she remained in remission. Continues annual follow-up with history, physical examination and blood work.    Hypogammaglobulinemia, acquired This is related to long-term side effects from prior chemotherapy. She has  recurrent infection with herpesvirus, bronchitis and skin infections She is already on chronic treatment with Valtrex daily. She is doing well and will continue IVIG treatment at 1 g/kg every [redacted] weeks along with premedication with Benadryl, Tylenol and Solu-Medrol. The risk, benefit, side effects of IVIG is fully discussed with the patient and she agreed to proceed. We discussed the importance of preventive care and reviewed the vaccination programs. She does not have any prior allergic reactions to influenza vaccination. She agrees to proceed with influenza vaccination today and we will administer it today at the clinic.    RHINOSINUSITIS, CHRONIC She have chronic rhinosinusitis and recurrent infection. Recently, she have another episode of bronchitis but she did not fill the prescription antibiotics. Clinically, she is slowly improving. I recommend close follow-up with the pulmonologist  Excessive weight gain She has frequent heartburn which is suspect is related to morbid obesity. Her blood pressure has also been coming up and down. I recommend exercise and weight loss program as tolerated. I have ordered thyroid function tests and it is pending to make sure that hypothyroidism is not contributing factor to her weight loss.   Orders Placed This Encounter  Procedures  . CBC with Differential/Platelet    Standing Status: Future     Number of Occurrences:      Standing Expiration Date: 07/08/2016  . IgG, IgA, IgM    Standing Status: Future     Number of Occurrences:      Standing Expiration Date: 07/08/2016   All questions were answered. The patient knows to call the clinic with any problems, questions or concerns. No barriers to learning was detected. I spent 15 minutes counseling the patient face to face. The total time spent in the appointment was 20 minutes and more than 50% was on counseling and review of test results     [facility], [place], MD 06/04/2015 9:14 AM

## 2015-06-04 NOTE — Assessment & Plan Note (Signed)
She have chronic rhinosinusitis and recurrent infection. Recently, she have another episode of bronchitis but she did not fill the prescription antibiotics. Clinically, she is slowly improving. I recommend close follow-up with the pulmonologist

## 2015-06-04 NOTE — Assessment & Plan Note (Signed)
Clinically, she remained in remission. Continues annual follow-up with history, physical examination and blood work.  

## 2015-06-04 NOTE — Assessment & Plan Note (Signed)
She has frequent heartburn which is suspect is related to morbid obesity. Her blood pressure has also been coming up and down. I recommend exercise and weight loss program as tolerated. I have ordered thyroid function tests and it is pending to make sure that hypothyroidism is not contributing factor to her weight loss.

## 2015-06-04 NOTE — Patient Instructions (Signed)

## 2015-06-04 NOTE — Assessment & Plan Note (Addendum)
This is related to long-term side effects from prior chemotherapy. She has recurrent infection with herpesvirus, bronchitis and skin infections She is already on chronic treatment with Valtrex daily. She is doing well and will continue IVIG treatment at 1 g/kg every [redacted] weeks along with premedication with Benadryl, Tylenol and Solu-Medrol. The risk, benefit, side effects of IVIG is fully discussed with the patient and she agreed to proceed. We discussed the importance of preventive care and reviewed the vaccination programs. She does not have any prior allergic reactions to influenza vaccination. She agrees to proceed with influenza vaccination today and we will administer it today at the clinic.

## 2015-06-04 NOTE — Telephone Encounter (Signed)
Appointments complete per 10/10 pof. Patient given avs report and appointments in inf area.

## 2015-06-05 ENCOUNTER — Telehealth: Payer: Self-pay | Admitting: *Deleted

## 2015-06-05 LAB — IGG, IGA, IGM
IGA: 32 mg/dL — AB (ref 69–380)
IgG (Immunoglobin G), Serum: 1220 mg/dL (ref 690–1700)

## 2015-06-05 NOTE — Telephone Encounter (Signed)
-----   Message from Heath Lark, MD sent at 06/05/2015  8:02 AM EDT ----- Regarding: Pls let her know IgG level done yesterday was normal   ----- Message -----    From: [facility] Interface    Sent: 06/04/2015   8:45 AM      To: Heath Lark, MD

## 2015-06-05 NOTE — Telephone Encounter (Signed)
Informed pt of Dr. Calton Dach message below.  She verbalized understanding.   She asks if Dr. Alvy Bimler can slow down the rate of IVIG.  Pt states she feels it "went too fast" yesterday.   Pt says the whole infusion only took about 3 hrs and she feels it is supposed to go slower.  She says she feels more side effects today such as body aches and she didn't have these side effects when it ran slower the time before.

## 2015-06-05 NOTE — Telephone Encounter (Signed)
Yes and call Sharyn Lull to lengthen her infusion time to 6 hours

## 2015-06-18 ENCOUNTER — Encounter: Payer: Self-pay | Admitting: Internal Medicine

## 2015-06-18 ENCOUNTER — Ambulatory Visit (INDEPENDENT_AMBULATORY_CARE_PROVIDER_SITE_OTHER)
Admission: RE | Admit: 2015-06-18 | Discharge: 2015-06-18 | Disposition: A | Discharge status: Home / Self Care | Payer: 59 | Source: Ambulatory Visit | Attending: Internal Medicine | Admitting: Internal Medicine

## 2015-06-18 ENCOUNTER — Ambulatory Visit: Payer: 59 | Admitting: Internal Medicine

## 2015-06-18 VITALS — BP 114/66 | HR 87 | Ht 64.0 in | Wt 208.0 lb

## 2015-06-18 DIAGNOSIS — D801 Nonfamilial hypogammaglobulinemia: Secondary | ICD-10-CM | POA: Diagnosis not present

## 2015-06-18 MED ORDER — TRAMADOL HCL 50 MG PO TABS: 50.0000 mg | ORAL_TABLET | Freq: Four times a day (QID) | ORAL | Status: DC | PRN

## 2015-06-18 NOTE — Patient Instructions (Addendum)
Order- CXR  Dx  Hypogammaglobulinemia, acquired  Script for tramadol to use for cough if needed    Ok also to use an otc cough syrup like Delsym

## 2015-06-18 NOTE — Progress Notes (Signed)
Subjective:    Patient ID: Bianca Parker, female    DOB: 07-27-1946, 69 y.o.   MRN: [id number]  HPI 5/2HPI  88 yoF followed for Allergic rhinitis, chronic rhinosinusitis. Hx chemoRx for CLL in remission.  Last here- March 9. She responded to antibiotic, but then got another cold clearing more quickly. Saw Dr Lucia Gaskins- recognized "congestion" left nostril.  Denies infection now. Catches colds very quickly from her grandchild.  Coming today off antihistamines for allergy testing.  Demonstrated immunoglobulin deficiency as of 6/ 2011, residual from her chemotherapy of 8-9 years ago, reviewed.  She has encasings and HEPA filter.  Allergy profile- 11/01/10- IgE < 1.5; no specific elevations. Also low IgG for penicillin  Skin testing- weak positive intradermal reactions to common inhalants.   02/24/11- 39 yoF followed for Allergic rhinitis, chronic rhinosinusitis. Hx chemoRx for CLL in remission Since last here she has done fairly well with fexofenadine. She has had one "sinus infection" since last here. We talked again about non-bacterial sinusitis. She emphases that she gets fever, bed-ridden malaise. Very concerned about her host resistance because of her leukemia hx. We discussed how to culture, risks of allergy and resistance with frequent antibiotics.  Today she feels well- 2 months without an infection. Realizes risks with exposure to her gransdson's virus colds.   07/01/11-  47 yoF followed for Allergic rhinitis, chronic rhinosinusitis. Hx chemoRx for CLL in remission/ Dr Beryle Beams Has had flu vaccine. A cold  has spread to her family. She started herself with saline nasal rinse using boiled well water. Got Zicam lozenges and thinks she fought off the cold. Takes probiotics routinely. Her CLL may not be in remission now. Neutrophils and platelets are down. 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  Hx chemoRx for CLL in remission/ Dr Beryle Beams, acquired hypogammaglobulinemia Recently having ? bronchitis flare-was put on Pred pak from PCP. No sinus congestion or sinus trouble. Somewhat better but not completly gone. She chronic bronchitis 3 weeks ago from her husband- prednisone taper from [place]. Did shake off earlier cold. Was treated with Avelox in September. Now scant white phlegm or pale green. Some rattle. No fever or chills. Mucinex DM helps. Dr Beryle Beams heme-onc treating acquired hypogammaglobulinemia with IVIG. Her CLL remains in remission.  02/15/13- 67 yoF followed for Allergic rhinitis, chronic rhinosinusitis. Hx chemoRx for CLL in remission/ Dr Beryle Beams, acquired hypogammaglobulinemia ACUTE VISIT: recently having flare ups; allergy flare ups as well. Stayed congested and stuffy -almost like a cold. Blames spring allergy season. Home has a new heating system and duct. Feeling better with summer weather. Benadryl and Allegra daily. Flonase raised glaucoma pressure. Does Neti pot with 1-2 drops of Afrin.  07/05/13- 67 yoF followed for Allergic rhinitis, chronic rhinosinusitis. Hx chemoRx for CLL in remission/ Dr Beryle Beams, acquired hypogammaglobulinemia FOLLOWS FOR: 5 month follow up.  does report some head congestion with clear mucus, some PND, some prod cough x3 weeks.  denies wheezing, increased SOB, f/c/s. Off IVIG. Fought off a cold without antibiotics. Diagnosed borderline glaucoma. Uses saline nasal rinse if needed.  06/14/14-  67 yoF followed for Allergic rhinitis, chronic rhinosinusitis. Hx chemoRx for CLL in remission, acquired hypogammaglobulinemia FOLLOWS FOR: Allergies are kicking in more this week. Should she change her meds. Immunoglobulin deficiency. Oncologist considered IVIG but patient has felt very well and wanted to wait. She emphasizes healthy life style and only needed antibiotic once since last visit., Hiking. Takes  Zicam at onset of colds. IVIG  only is sick in hospital. She sought opinion from [facility] . Noting only minor head congestion, nothing discolored.   09/18/14- 67 yoF followed for Allergic rhinitis, chronic rhinosinusitis. Hx chemoRx for CLL in remission, acquired hypogammaglobulinemia ACUTE VISIT: ear pain with sore throat as well-started Saturday morning. Currently on Biaxin abx-2 doses left. She reports 3 distinct colds since mid December, each becoming purulent and each responding first a Z-Pak then doxycycline and most recently Biaxin. With each episode she had conjunctivitis treated by her eye doctor. Now 2 days of left earache and sore throat. She has used her Ciprodex.  12/12/14-69 yoF followed for Allergic rhinitis, chronic rhinosinusitis. Hx chemoRx for CLL in remission, acquired hypogammaglobulinemia FOLLOW FOR: patient feeling much better.  discuss allergies.  Had repeated eye infections and respiratory infections this winter but all resolved now. Complains of "allergy" waking in the morning with cough, postnasal drip, ear pain relieved by popping ears, white sputum. Clears after a few hours. Taking fexofenadine each morning *An Additional problem-asks clarification drug allergy. History of rash with penicillin and sulfa, anaphylaxis with pentamidine.  06/18/15- 71 yoF followed for Allergic rhinitis, chronic rhinosinusitis. Hx chemoRx for CLL in remission, acquired hypogammaglobulinemia/ IVIG On IVIG. Dr Gearldine Bienenstock doxy for sinus infection in September and asks about plans for dealing with acute infections.  Up to date on flu and PVAX. FOLLOWS FOR: still has a cough from a sinus infection 5 weeks treated by Oncology.  cough is dry at times with spasms with occasional vomiting, sometimes clear white mucus prod in the AM.  Oncology is wondering about a referral to ID to manage infections    Review of Systems- see HPI Constitutional:   No-   weight loss, night sweats, fevers, chills, fatigue, lassitude. HEENT:    No-  headaches, difficulty swallowing, tooth/dental problems, sore throat,       No-  sneezing, itching, +ear ache, +nasal congestion, +ost nasal drip,  CV:  No-   chest pain, orthopnea, PND, swelling in lower extremities, anasarca, dizziness, palpitations Resp: No-   shortness of breath with exertion or at rest.              +  productive cough,   non-productive cough,  No- coughing up of blood.              No-change in color of mucus.  No- wheezing.   Skin: No-   rash or lesions. GI:  No-   heartburn, indigestion, abdominal pain, nausea, vomiting,  GU: . MS:  No-   joint pain or swelling. Neuro-     nothing unusual Psych:  No- change in mood or affect. No depression or anxiety.  No memory loss.  Objective:   Physical Exam General- Alert, Oriented, Affect-appropriate, Distress- none acute; well appearing,                 medium Build. Looks well.  Skin- rash-none, lesions- none, excoriation- none Lymphadenopathy- none Head- atraumatic            Eyes- Gross vision intact, PERRLA, conjunctivae clear secretions            Ears- Hearing, canals-normal. Sclerosis of TMs-no inflammation or fluid            Nose- +turbinate edema, no-Septal dev, mucus, polyps, erosion, perforation             Throat- Mallampati II , mucosa + red without exudate , drainage- none, tonsils-  atrophic Neck- flexible , trachea midline, no stridor , thyroid nl, carotid no bruit Chest - symmetrical excursion , unlabored           Heart/CV- RRR , no murmur , no gallop  , no rub, nl s1 s2                           - JVD- none , edema- none, stasis changes- none, varices- none           Lung- clear to P&A, wheeze- none, cough-none , dullness-none, rub- none           Chest wall-  Abd-  Br/ Gen/ Rectal- Not done, not indicated Extrem- cyanosis- none, clubbing, none, atrophy- none, strength- nl Neuro- grossly intact to observation

## 2015-06-21 ENCOUNTER — Ambulatory Visit
Admission: RE | Admit: 2015-06-21 | Discharge: 2015-06-21 | Disposition: A | Discharge status: Home / Self Care | Payer: 59 | Source: Ambulatory Visit

## 2015-06-21 ENCOUNTER — Encounter: Payer: Self-pay | Admitting: Hematology and Oncology

## 2015-06-21 DIAGNOSIS — Z1231 Encounter for screening mammogram for malignant neoplasm of breast: Secondary | ICD-10-CM

## 2015-06-29 ENCOUNTER — Other Ambulatory Visit: Payer: Self-pay | Admitting: Obstetrics and Gynecology

## 2015-06-29 DIAGNOSIS — E041 Nontoxic single thyroid nodule: Secondary | ICD-10-CM

## 2015-07-02 ENCOUNTER — Ambulatory Visit (HOSPITAL_BASED_OUTPATIENT_CLINIC_OR_DEPARTMENT_OTHER): Payer: 59

## 2015-07-02 VITALS — BP 144/72 | HR 85 | Temp 98.2°F | Resp 19

## 2015-07-02 DIAGNOSIS — D801 Nonfamilial hypogammaglobulinemia: Secondary | ICD-10-CM | POA: Diagnosis not present

## 2015-07-02 MED ORDER — PRIVIGEN 10 GM/100ML IV SOLN
1.0000 g/kg | Freq: Once | INTRAVENOUS | Status: AC
  Administered 2015-07-02: 95 g via INTRAVENOUS
  Filled 2015-07-02: qty 950

## 2015-07-02 MED ORDER — ACETAMINOPHEN 325 MG PO TABS
ORAL_TABLET | ORAL | Status: AC
Start: 2015-07-02 — End: 2015-07-02
  Filled 2015-07-02: qty 2

## 2015-07-02 MED ORDER — IMMUNE GLOBULIN (HUMAN) 10 GM/100ML IV SOLN: 1.0000 g/kg | Freq: Once | INTRAVENOUS | Status: DC

## 2015-07-02 MED ORDER — ACETAMINOPHEN 325 MG PO TABS
650.0000 mg | ORAL_TABLET | Freq: Once | ORAL | Status: AC
  Administered 2015-07-02: 650 mg via ORAL

## 2015-07-02 MED ORDER — DIPHENHYDRAMINE HCL 25 MG PO CAPS
ORAL_CAPSULE | ORAL | Status: AC
  Filled 2015-07-02: qty 2

## 2015-07-02 MED ORDER — METHYLPREDNISOLONE SODIUM SUCC 125 MG IJ SOLR
40.0000 mg | Freq: Once | INTRAMUSCULAR | Status: AC
  Administered 2015-07-02: 40 mg via INTRAVENOUS

## 2015-07-02 MED ORDER — DIPHENHYDRAMINE HCL 25 MG PO TABS
50.0000 mg | ORAL_TABLET | Freq: Once | ORAL | Status: AC
  Administered 2015-07-02: 50 mg via ORAL
  Filled 2015-07-02: qty 2

## 2015-07-02 MED ORDER — METHYLPREDNISOLONE SODIUM SUCC 40 MG IJ SOLR
INTRAMUSCULAR | Status: AC
  Filled 2015-07-02: qty 1

## 2015-07-02 NOTE — Progress Notes (Signed)
Pt finished Privigen infusion with no complications.  VS obtained and stable as charted.  Pt refusing to stay for 30 min post infusion observation period.  Pt verbalized understanding to notify us if she has any questions or concerns arise and to seek emergency care should any side effects arise.  Pt and husband verbalized understanding and have no questions or concerns at time of discharge.

## 2015-07-02 NOTE — Patient Instructions (Signed)

## 2015-07-03 ENCOUNTER — Other Ambulatory Visit: Payer: 59

## 2015-07-09 ENCOUNTER — Ambulatory Visit
Admission: RE | Admit: 2015-07-09 | Discharge: 2015-07-09 | Disposition: A | Discharge status: Home / Self Care | Payer: 59 | Source: Ambulatory Visit | Attending: Obstetrics and Gynecology | Admitting: Obstetrics and Gynecology

## 2015-07-09 DIAGNOSIS — E041 Nontoxic single thyroid nodule: Secondary | ICD-10-CM

## 2015-07-24 ENCOUNTER — Telehealth: Payer: Self-pay | Admitting: *Deleted

## 2015-07-24 ENCOUNTER — Encounter: Payer: Self-pay | Admitting: Hematology and Oncology

## 2015-07-24 ENCOUNTER — Telehealth: Payer: Self-pay

## 2015-07-24 ENCOUNTER — Other Ambulatory Visit (HOSPITAL_BASED_OUTPATIENT_CLINIC_OR_DEPARTMENT_OTHER): Payer: 59

## 2015-07-24 ENCOUNTER — Other Ambulatory Visit: Payer: Self-pay | Admitting: Hematology and Oncology

## 2015-07-24 DIAGNOSIS — C9111 Chronic lymphocytic leukemia of B-cell type in remission: Secondary | ICD-10-CM | POA: Diagnosis not present

## 2015-07-24 DIAGNOSIS — K625 Hemorrhage of anus and rectum: Secondary | ICD-10-CM | POA: Insufficient documentation

## 2015-07-24 HISTORY — DX: Hemorrhage of anus and rectum: K62.5

## 2015-07-24 LAB — CBC WITH DIFFERENTIAL/PLATELET
BASO%: 0.3 % (ref 0.0–2.0)
Basophils Absolute: 0 10*3/uL (ref 0.0–0.1)
EOS%: 1.3 % (ref 0.0–7.0)
Eosinophils Absolute: 0.1 10*3/uL (ref 0.0–0.5)
HCT: 43.5 % (ref 34.8–46.6)
HGB: 14.3 g/dL (ref 11.6–15.9)
LYMPH%: 37.5 % (ref 14.0–49.7)
MCH: 29.3 pg (ref 25.1–34.0)
MCHC: 32.8 g/dL (ref 31.5–36.0)
MCV: 89.4 fL (ref 79.5–101.0)
MONO#: 0.4 10*3/uL (ref 0.1–0.9)
MONO%: 7.5 % (ref 0.0–14.0)
NEUT%: 53.4 % (ref 38.4–76.8)
NEUTROS ABS: 2.7 10*3/uL (ref 1.5–6.5)
Platelets: 179 10*3/uL (ref 145–400)
RBC: 4.86 10*6/uL (ref 3.70–5.45)
RDW: 13.5 % (ref 11.2–14.5)
WBC: 5.1 10*3/uL (ref 3.9–10.3)
lymph#: 1.9 10*3/uL (ref 0.9–3.3)

## 2015-07-24 NOTE — Telephone Encounter (Signed)
CBC normal. Pt to call if any further problems. Reinforced sitz bath and stool softener

## 2015-07-24 NOTE — Telephone Encounter (Signed)
I have no way of working her in If she wants to be seen today, she needs to go to [facility] If she can wait till tomorrow, I can see her tomorrow at 1015 am I placed order for labs but not POF Recommend Sitz bath and stool softener for now

## 2015-07-24 NOTE — Telephone Encounter (Signed)
Pt left message stating this AM after a bowel movement she stood up and was bleeding on the floor. More than spotting but not an emergency. Also yesterday a blood vessel ruptured in her eye. She is concerned about her platelets. She is on IVIG. Message forwarded to Dr Alvy Bimler and [facility].

## 2015-07-24 NOTE — Telephone Encounter (Signed)
Pt does not feel she needs to be seen. Is really wanting to have labs only . Feels like it is a hemorrhoid.  Will come in for labs today at 1300.  She talked with eye doctor about ruptured vessel yesterday. Had lifted a heavy box yesterday. Eye is looking better today

## 2015-07-25 ENCOUNTER — Telehealth: Payer: Self-pay | Admitting: *Deleted

## 2015-07-25 LAB — PROTHROMBIN TIME
INR: 0.93 (ref <1.50)
PROTHROMBIN TIME: 12.6 s (ref 11.6–15.2)

## 2015-07-25 LAB — APTT: APTT: 27 s (ref 24–37)

## 2015-07-25 NOTE — Telephone Encounter (Signed)
-----   Message from Heath Lark, MD sent at 07/25/2015  7:26 AM EST ----- Regarding: all tests normal   ----- Message -----    From: [facility] Interface    Sent: 07/24/2015   1:15 PM      To: Heath Lark, MD

## 2015-07-25 NOTE — Telephone Encounter (Signed)
Spoke with patient. Had small amount of blood on tissue this morning when she blew her nose. Did not drip out of nose or seem like active bleed. Instructed to let us know if further problems.

## 2015-07-27 ENCOUNTER — Other Ambulatory Visit: Payer: Self-pay | Admitting: Hematology and Oncology

## 2015-07-30 ENCOUNTER — Ambulatory Visit (HOSPITAL_BASED_OUTPATIENT_CLINIC_OR_DEPARTMENT_OTHER): Payer: 59

## 2015-07-30 VITALS — BP 141/63 | HR 92 | Temp 98.3°F | Resp 20

## 2015-07-30 DIAGNOSIS — D801 Nonfamilial hypogammaglobulinemia: Secondary | ICD-10-CM | POA: Diagnosis not present

## 2015-07-30 MED ORDER — DIPHENHYDRAMINE HCL 25 MG PO TABS
50.0000 mg | ORAL_TABLET | Freq: Once | ORAL | Status: AC
  Administered 2015-07-30: 50 mg via ORAL
  Filled 2015-07-30: qty 2

## 2015-07-30 MED ORDER — ACETAMINOPHEN 325 MG PO TABS
ORAL_TABLET | ORAL | Status: AC
  Filled 2015-07-30: qty 2

## 2015-07-30 MED ORDER — PRIVIGEN 10 GM/100ML IV SOLN
95.0000 g | Freq: Once | INTRAVENOUS | Status: AC
  Administered 2015-07-30: 95 g via INTRAVENOUS
  Filled 2015-07-30: qty 950

## 2015-07-30 MED ORDER — ACETAMINOPHEN 325 MG PO TABS
650.0000 mg | ORAL_TABLET | Freq: Once | ORAL | Status: AC
  Administered 2015-07-30: 650 mg via ORAL

## 2015-07-30 MED ORDER — IMMUNE GLOBULIN (HUMAN) 10 GM/100ML IV SOLN: 1.0000 g/kg | Freq: Once | INTRAVENOUS | Status: DC

## 2015-07-30 MED ORDER — METHYLPREDNISOLONE SODIUM SUCC 125 MG IJ SOLR
40.0000 mg | Freq: Once | INTRAMUSCULAR | Status: AC
  Administered 2015-07-30: 40 mg via INTRAVENOUS

## 2015-07-30 MED ORDER — METHYLPREDNISOLONE SODIUM SUCC 125 MG IJ SOLR
INTRAMUSCULAR | Status: AC
  Filled 2015-07-30: qty 2

## 2015-07-30 MED ORDER — DIPHENHYDRAMINE HCL 25 MG PO CAPS
ORAL_CAPSULE | ORAL | Status: AC
  Filled 2015-07-30: qty 2

## 2015-07-30 NOTE — Patient Instructions (Signed)

## 2015-07-30 NOTE — Progress Notes (Signed)
Pt had an episode of rectal bleeding x 1 day and nose bleed  X 1 day last week. No  Bleeding episodes noted at this time.

## 2015-08-24 ENCOUNTER — Telehealth: Payer: Self-pay | Admitting: *Deleted

## 2015-08-24 ENCOUNTER — Telehealth: Payer: Self-pay | Admitting: Hematology and Oncology

## 2015-08-24 ENCOUNTER — Ambulatory Visit (HOSPITAL_BASED_OUTPATIENT_CLINIC_OR_DEPARTMENT_OTHER): Payer: 59 | Admitting: Hematology and Oncology

## 2015-08-24 ENCOUNTER — Ambulatory Visit (HOSPITAL_BASED_OUTPATIENT_CLINIC_OR_DEPARTMENT_OTHER): Payer: 59

## 2015-08-24 ENCOUNTER — Other Ambulatory Visit (HOSPITAL_BASED_OUTPATIENT_CLINIC_OR_DEPARTMENT_OTHER): Payer: 59

## 2015-08-24 ENCOUNTER — Encounter: Payer: Self-pay | Admitting: Hematology and Oncology

## 2015-08-24 VITALS — BP 138/71 | HR 90 | Temp 98.0°F | Resp 20

## 2015-08-24 DIAGNOSIS — D801 Nonfamilial hypogammaglobulinemia: Secondary | ICD-10-CM

## 2015-08-24 DIAGNOSIS — C9111 Chronic lymphocytic leukemia of B-cell type in remission: Secondary | ICD-10-CM

## 2015-08-24 LAB — CBC WITH DIFFERENTIAL/PLATELET
BASO%: 0.2 % (ref 0.0–2.0)
BASOS ABS: 0 10*3/uL (ref 0.0–0.1)
EOS%: 1.3 % (ref 0.0–7.0)
Eosinophils Absolute: 0.1 10*3/uL (ref 0.0–0.5)
HCT: 43.9 % (ref 34.8–46.6)
HEMOGLOBIN: 14.5 g/dL (ref 11.6–15.9)
LYMPH#: 1.9 10*3/uL (ref 0.9–3.3)
LYMPH%: 36 % (ref 14.0–49.7)
MCH: 29.2 pg (ref 25.1–34.0)
MCHC: 33 g/dL (ref 31.5–36.0)
MCV: 88.5 fL (ref 79.5–101.0)
MONO#: 0.4 10*3/uL (ref 0.1–0.9)
MONO%: 8 % (ref 0.0–14.0)
NEUT#: 2.9 10*3/uL (ref 1.5–6.5)
NEUT%: 54.5 % (ref 38.4–76.8)
Platelets: 169 10*3/uL (ref 145–400)
RBC: 4.96 10*6/uL (ref 3.70–5.45)
RDW: 13.5 % (ref 11.2–14.5)
WBC: 5.3 10*3/uL (ref 3.9–10.3)

## 2015-08-24 MED ORDER — DIPHENHYDRAMINE HCL 25 MG PO CAPS
ORAL_CAPSULE | ORAL | Status: AC
  Filled 2015-08-24: qty 2

## 2015-08-24 MED ORDER — ACETAMINOPHEN 325 MG PO TABS
ORAL_TABLET | ORAL | Status: AC
  Filled 2015-08-24: qty 2

## 2015-08-24 MED ORDER — IMMUNE GLOBULIN (HUMAN) 20 GM/200ML IV SOLN: 1.0000 g/kg | Freq: Once | INTRAVENOUS | Status: DC

## 2015-08-24 MED ORDER — METHYLPREDNISOLONE SODIUM SUCC 125 MG IJ SOLR
INTRAMUSCULAR | Status: AC
  Filled 2015-08-24: qty 2

## 2015-08-24 MED ORDER — ACETAMINOPHEN 325 MG PO TABS
650.0000 mg | ORAL_TABLET | Freq: Once | ORAL | Status: AC
  Administered 2015-08-24: 650 mg via ORAL

## 2015-08-24 MED ORDER — METHYLPREDNISOLONE SODIUM SUCC 125 MG IJ SOLR
40.0000 mg | Freq: Once | INTRAMUSCULAR | Status: AC
  Administered 2015-08-24: 40 mg via INTRAVENOUS

## 2015-08-24 MED ORDER — DIPHENHYDRAMINE HCL 25 MG PO TABS
50.0000 mg | ORAL_TABLET | Freq: Once | ORAL | Status: AC
  Administered 2015-08-24: 50 mg via ORAL
  Filled 2015-08-24: qty 2

## 2015-08-24 MED ORDER — PRIVIGEN 10 GM/100ML IV SOLN
1.0000 g/kg | Freq: Once | INTRAVENOUS | Status: AC
  Administered 2015-08-24: 95 g via INTRAVENOUS
  Filled 2015-08-24: qty 950

## 2015-08-24 NOTE — Patient Instructions (Signed)

## 2015-08-24 NOTE — Telephone Encounter (Signed)
Schedule patient appt per pof, avs report printed.  Staff message sent Sharyn Lull

## 2015-08-24 NOTE — Telephone Encounter (Signed)
Per staff message and POF I have scheduled appts. Advised scheduler of appts. JMW  

## 2015-08-24 NOTE — Assessment & Plan Note (Signed)
This is related to long-term side effects from prior chemotherapy. She has recurrent infection with herpesvirus, bronchitis and skin infections She is already on chronic treatment with Valtrex daily. She is doing well and will continue IVIG treatment at 1 g/kg every [redacted] weeks along with premedication with Benadryl, Tylenol and Solu-Medrol. The risk, benefit, side effects of IVIG is fully discussed with the patient and she agreed to proceed.   

## 2015-08-24 NOTE — Assessment & Plan Note (Signed)
Clinically, she remained in remission. Continues annual follow-up with history, physical examination and blood work.  

## 2015-08-24 NOTE — Assessment & Plan Note (Signed)
She had recent rectal bleeding which resolved spontaneously. Continue conservative management.

## 2015-08-24 NOTE — Progress Notes (Signed)
[facility] OFFICE PROGRESS NOTE  Patient Care Team: Haywood Pao, MD as PCP - General (Internal Medicine) Deneise Lever, MD as Referring Physician (Pulmonary Disease) Rozetta Nunnery, MD as Referring Physician (Otolaryngology) Milus Banister, MD (Gastroenterology) Arvella Nigh, MD (Obstetrics and Gynecology) Heath Lark, MD as Consulting Physician (Hematology and Oncology) Jari Pigg, MD as Consulting Physician (Dermatology) Marygrace Drought, MD as Consulting Physician (Ophthalmology)  SUMMARY OF ONCOLOGIC HISTORY:  She was in one of the first cohorts of patients treated with FCR on protocol at the [facility] at time of her initial diagnosis in July 2002. Technically she had RAI  stage III disease due to anemia but she had no organomegaly or lymphadenopathy on exam. FCR was started in 12/2001, and continued through 06/2002. She achieved a complete hematologic response and,which has been durable now for over 11 years!  She did develop hypogammaglobulinemia. As a result, she gets recurrent bronchitis and sinusitis. In April 2013 she was started her on monthly intravenous immunoglobulin 400 mg per kilogram and this has significantly impacted on the frequency and severity of her infections. This was continued through April of 2014. She has had no new infections since stopping the IVIG but as expected, immunoglobulin levels have fallen to pretreatment levels.   She has a history of her recurrent squamous cell carcinomas of her skin and has had 2 lesions excised from her left tibial area and another 1 excised from the vulvar area. Recently, she had a basal cell carcinoma removed. She has a history of both herpes zoster and herpes simplex infections. Recently, she had abnormal Pap smear and currently on close observation. Recent colposcopy was negative.  She received IVIG treatment for acquired hypogammaglobulinemia but stopped in April 2014 due to sick serum  syndrome In July 2016, IVIG is resumed with premedication In August 2016, she developed cellulitis from venipuncture, resolved with antibiotics   INTERVAL HISTORY: Please see below for problem oriented charting. Recently, she had episode of rectal bleeding after bowel movement and spontaneous nosebleed which resolved conservatively. She denies recent infection although she has some mild nasal drainage but they were not as severe as before. Her cough is improving. She denies recent fevers or chills. No new lymphadenopathy. She had slight reaction to IVIG, managed successfully with conservative management. Overall, she felt that the IVIG is helping her.  REVIEW OF SYSTEMS:   Constitutional: Denies fevers, chills or abnormal weight loss Eyes: Denies blurriness of vision Ears, nose, mouth, throat, and face: Denies mucositis or sore throat Respiratory: Denies cough, dyspnea or wheezes Cardiovascular: Denies palpitation, chest discomfort or lower extremity swelling Gastrointestinal:  Denies nausea, heartburn or change in bowel habits Skin: Denies abnormal skin rashes Lymphatics: Denies new lymphadenopathy or easy bruising Neurological:Denies numbness, tingling or new weaknesses Behavioral/Psych: Mood is stable, no new changes  All other systems were reviewed with the patient and are negative.  I have reviewed the past medical history, past surgical history, social history and family history with the patient and they are unchanged from previous note.  ALLERGIES:  is allergic to penicillins; pentamidine; and sulfonamide derivatives.  MEDICATIONS:  Current Outpatient Prescriptions  Medication Sig Dispense Refill  . Calcium Carbonate-Vitamin D (CALTRATE 600+D) 600-400 MG-UNIT per tablet Take 1 tablet by mouth daily.    . fexofenadine (ALLEGRA) 180 MG tablet Take 180 mg by mouth daily.      Marland Kitchen levothyroxine (SYNTHROID, LEVOTHROID) 50 MCG tablet     . Multiple Vitamin (MULTIVITAMIN) tablet  Take 1 tablet by mouth daily.    . mupirocin ointment (BACTROBAN) 2 %     . valACYclovir (VALTREX) 500 MG tablet Take 500 mg by mouth as needed.    Marland Kitchen dextromethorphan-guaiFENesin (MUCINEX DM) 30-600 MG 12hr tablet Take 1 tablet by mouth at bedtime as needed for cough. Reported on 08/24/2015    . guaiFENesin (MUCINEX) 600 MG 12 hr tablet Take by mouth daily as needed. Reported on 08/24/2015    . traMADol (ULTRAM) 50 MG tablet Take 1 tablet (50 mg total) by mouth every 6 (six) hours as needed. (Patient not taking: Reported on 08/24/2015) 40 tablet 1   No current facility-administered medications for this visit.   Facility-Administered Medications Ordered in Other Visits  Medication Dose Route Frequency Provider Last Rate Last Dose  . Immune Globulin 10% (OCTAGAM) IV infusion 95 g  1 g/kg Intravenous Once Heath Lark, MD      . Immune Globulin 10% (PRIVIGEN) IV infusion 95 g  1 g/kg Intravenous Once Heath Lark, MD        PHYSICAL EXAMINATION: ECOG PERFORMANCE STATUS: 0 - Asymptomatic  Filed Vitals:   08/24/15 0844  BP: 143/59  Pulse: 79  Temp: 98.1 F (36.7 C)  Resp: 18   Filed Weights   08/24/15 0844  Weight: 209 lb 11.2 oz (95.119 kg)    GENERAL:alert, no distress and comfortable SKIN: skin color, texture, turgor are normal, no rashes or significant lesions EYES: normal, Conjunctiva are pink and non-injected, sclera clear OROPHARYNX:no exudate, no erythema and lips, buccal mucosa, and tongue normal  NECK: supple, thyroid normal size, non-tender, without nodularity LYMPH:  no palpable lymphadenopathy in the cervical, axillary or inguinal LUNGS: clear to auscultation and percussion with normal breathing effort HEART: regular rate & rhythm and no murmurs and no lower extremity edema ABDOMEN:abdomen soft, non-tender and normal bowel sounds Musculoskeletal:no cyanosis of digits and no clubbing  NEURO: alert & oriented x 3 with fluent speech, no focal motor/sensory  deficits  LABORATORY DATA:  I have reviewed the data as listed    Component Value Date/Time   NA 140 06/04/2015 0829   NA 141 05/22/2014 0737   K 3.8 06/04/2015 0829   K 4.3 05/22/2014 0737   CL 104 05/22/2014 0737   CL 109* 01/20/2013 0801   CO2 21* 06/04/2015 0829   CO2 30 05/22/2014 0737   GLUCOSE 120 06/04/2015 0829   GLUCOSE 80 05/22/2014 0737   GLUCOSE 89 01/20/2013 0801   BUN 18.8 06/04/2015 0829   BUN 19 05/22/2014 0737   CREATININE 1.1 06/04/2015 0829   CREATININE 1.0 05/22/2014 0737   CALCIUM 9.4 06/04/2015 0829   CALCIUM 9.5 05/22/2014 0737   PROT 7.1 06/04/2015 0829   PROT 6.1 05/22/2014 0737   ALBUMIN 3.7 06/04/2015 0829   ALBUMIN 4.4 05/22/2014 0737   AST 34 06/04/2015 0829   AST 33 05/22/2014 0737   ALT 33 06/04/2015 0829   ALT 40* 05/22/2014 0737   ALKPHOS 51 06/04/2015 0829   ALKPHOS 40 05/22/2014 0737   BILITOT 0.42 06/04/2015 0829   BILITOT 0.9 05/22/2014 0737   GFRNONAA 57* 04/18/2009 0943   GFRAA  04/18/2009 0943    >60        The eGFR has been calculated using the MDRD equation. This calculation has not been validated in all clinical situations. eGFR's persistently <60 mL/min signify possible Chronic Kidney Disease.    No results found for: SPEP, UPEP  Lab Results  Component Value Date  WBC 5.3 08/24/2015   NEUTROABS 2.9 08/24/2015   HGB 14.5 08/24/2015   HCT 43.9 08/24/2015   MCV 88.5 08/24/2015   PLT 169 08/24/2015      Chemistry      Component Value Date/Time   NA 140 06/04/2015 0829   NA 141 05/22/2014 0737   K 3.8 06/04/2015 0829   K 4.3 05/22/2014 0737   CL 104 05/22/2014 0737   CL 109* 01/20/2013 0801   CO2 21* 06/04/2015 0829   CO2 30 05/22/2014 0737   BUN 18.8 06/04/2015 0829   BUN 19 05/22/2014 0737   CREATININE 1.1 06/04/2015 0829   CREATININE 1.0 05/22/2014 0737   GLU 92 06/17/2013      Component Value Date/Time   CALCIUM 9.4 06/04/2015 0829   CALCIUM 9.5 05/22/2014 0737   ALKPHOS 51 06/04/2015 0829    ALKPHOS 40 05/22/2014 0737   AST 34 06/04/2015 0829   AST 33 05/22/2014 0737   ALT 33 06/04/2015 0829   ALT 40* 05/22/2014 0737   BILITOT 0.42 06/04/2015 0829   BILITOT 0.9 05/22/2014 0737     ASSESSMENT & PLAN:  Chronic lymphoid leukemia in remission Clinically, she remained in remission. Continues annual follow-up with history, physical examination and blood work.      Hypogammaglobulinemia, acquired This is related to long-term side effects from prior chemotherapy. She has recurrent infection with herpesvirus, bronchitis and skin infections She is already on chronic treatment with Valtrex daily. She is doing well and will continue IVIG treatment at 1 g/kg every [redacted] weeks along with premedication with Benadryl, Tylenol and Solu-Medrol. The risk, benefit, side effects of IVIG is fully discussed with the patient and she agreed to proceed.    Rectal bleeding She had recent rectal bleeding which resolved spontaneously. Continue conservative management.   Orders Placed This Encounter  Procedures  . CBC with Differential/Platelet    Standing Status: Standing     Number of Occurrences: 6     Standing Expiration Date: 08/23/2016   All questions were answered. The patient knows to call the clinic with any problems, questions or concerns. No barriers to learning was detected. I spent 15 minutes counseling the patient face to face. The total time spent in the appointment was 20 minutes and more than 50% was on counseling and review of test results     [facility], [place], MD 08/24/2015 9:55 AM

## 2015-08-25 LAB — IGG, IGA, IGM
IgG (Immunoglobin G), Serum: 1370 mg/dL (ref 690–1700)
IgM, Serum: <5 mg/dL — ABNORMAL LOW (ref 52–322)

## 2015-08-30 ENCOUNTER — Encounter: Payer: Self-pay | Admitting: Hematology and Oncology

## 2015-08-31 ENCOUNTER — Encounter: Payer: Self-pay | Admitting: *Deleted

## 2015-09-24 ENCOUNTER — Ambulatory Visit (HOSPITAL_BASED_OUTPATIENT_CLINIC_OR_DEPARTMENT_OTHER): Payer: 59

## 2015-09-24 ENCOUNTER — Other Ambulatory Visit (HOSPITAL_BASED_OUTPATIENT_CLINIC_OR_DEPARTMENT_OTHER): Payer: 59

## 2015-09-24 VITALS — BP 135/70 | HR 74 | Temp 98.2°F | Resp 18

## 2015-09-24 DIAGNOSIS — C9111 Chronic lymphocytic leukemia of B-cell type in remission: Secondary | ICD-10-CM

## 2015-09-24 DIAGNOSIS — D801 Nonfamilial hypogammaglobulinemia: Secondary | ICD-10-CM | POA: Diagnosis not present

## 2015-09-24 LAB — CBC WITH DIFFERENTIAL/PLATELET
BASO%: 0.4 % (ref 0.0–2.0)
BASOS ABS: 0 10*3/uL (ref 0.0–0.1)
EOS ABS: 0.1 10*3/uL (ref 0.0–0.5)
EOS%: 1.1 % (ref 0.0–7.0)
HEMATOCRIT: 46.5 % (ref 34.8–46.6)
HEMOGLOBIN: 15.5 g/dL (ref 11.6–15.9)
LYMPH%: 31.3 % (ref 14.0–49.7)
MCH: 29 pg (ref 25.1–34.0)
MCHC: 33.3 g/dL (ref 31.5–36.0)
MCV: 87 fL (ref 79.5–101.0)
MONO#: 0.3 10*3/uL (ref 0.1–0.9)
MONO%: 6.6 % (ref 0.0–14.0)
NEUT%: 60.6 % (ref 38.4–76.8)
NEUTROS ABS: 3.1 10*3/uL (ref 1.5–6.5)
PLATELETS: 151 10*3/uL (ref 145–400)
RBC: 5.34 10*6/uL (ref 3.70–5.45)
RDW: 13.9 % (ref 11.2–14.5)
WBC: 5.2 10*3/uL (ref 3.9–10.3)
lymph#: 1.6 10*3/uL (ref 0.9–3.3)

## 2015-09-24 MED ORDER — ACETAMINOPHEN 325 MG PO TABS
ORAL_TABLET | ORAL | Status: AC
  Filled 2015-09-24: qty 2

## 2015-09-24 MED ORDER — METHYLPREDNISOLONE SODIUM SUCC 40 MG IJ SOLR
INTRAMUSCULAR | Status: AC
  Filled 2015-09-24: qty 1

## 2015-09-24 MED ORDER — IMMUNE GLOBULIN (HUMAN) 20 GM/200ML IV SOLN
95.0000 g | Freq: Once | INTRAVENOUS | Status: AC
  Administered 2015-09-24: 95 g via INTRAVENOUS
  Filled 2015-09-24: qty 950

## 2015-09-24 MED ORDER — DIPHENHYDRAMINE HCL 25 MG PO TABS
50.0000 mg | ORAL_TABLET | Freq: Once | ORAL | Status: AC
  Administered 2015-09-24: 50 mg via ORAL
  Filled 2015-09-24: qty 2

## 2015-09-24 MED ORDER — ACETAMINOPHEN 325 MG PO TABS
650.0000 mg | ORAL_TABLET | Freq: Once | ORAL | Status: AC
  Administered 2015-09-24: 650 mg via ORAL

## 2015-09-24 MED ORDER — DIPHENHYDRAMINE HCL 25 MG PO CAPS
ORAL_CAPSULE | ORAL | Status: AC
  Filled 2015-09-24: qty 2

## 2015-09-24 MED ORDER — METHYLPREDNISOLONE SODIUM SUCC 125 MG IJ SOLR
40.0000 mg | Freq: Once | INTRAMUSCULAR | Status: AC
  Administered 2015-09-24: 40 mg via INTRAVENOUS

## 2015-09-24 NOTE — Patient Instructions (Signed)

## 2015-10-08 ENCOUNTER — Telehealth: Payer: Self-pay | Admitting: *Deleted

## 2015-10-08 NOTE — Telephone Encounter (Signed)
Received a Fax from "[facility]" stating pt called them with c/o urinary burning and frequency.  The Nurse s/w Dr. Benay Spice on call and he advised pt needs to be seen by her PCP.  Dr. Alvy Bimler offered to see pt if she can come in today.   I left pt a VM asking if she has been able to see anyone for her urinary symptoms yet and to please call nurse back to let us know how she is doing and if she needs to be seen by Dr. Alvy Bimler.

## 2015-10-08 NOTE — Telephone Encounter (Signed)
Pt returned nurse's call and states she did go to a Urgent Care.   They did a Urinalysis and she was prescribed a one time dose of Monurol which she took and now feels better.  Denies any further urinary symptoms or fever.  Says the Urgent Care is supposed to send records over to [place].  Instructed pt to call us back if symptoms return and she verbalized understanding.

## 2015-10-22 ENCOUNTER — Ambulatory Visit (HOSPITAL_BASED_OUTPATIENT_CLINIC_OR_DEPARTMENT_OTHER): Payer: 59

## 2015-10-22 ENCOUNTER — Other Ambulatory Visit (HOSPITAL_BASED_OUTPATIENT_CLINIC_OR_DEPARTMENT_OTHER): Payer: 59

## 2015-10-22 VITALS — BP 139/75 | HR 82 | Temp 97.7°F | Resp 18

## 2015-10-22 DIAGNOSIS — C9111 Chronic lymphocytic leukemia of B-cell type in remission: Secondary | ICD-10-CM | POA: Diagnosis not present

## 2015-10-22 DIAGNOSIS — C801 Malignant (primary) neoplasm, unspecified: Secondary | ICD-10-CM | POA: Diagnosis not present

## 2015-10-22 DIAGNOSIS — D801 Nonfamilial hypogammaglobulinemia: Secondary | ICD-10-CM | POA: Diagnosis not present

## 2015-10-22 LAB — CBC WITH DIFFERENTIAL/PLATELET
BASO%: 0.2 % (ref 0.0–2.0)
Basophils Absolute: 0 10*3/uL (ref 0.0–0.1)
EOS%: 1.7 % (ref 0.0–7.0)
Eosinophils Absolute: 0.1 10*3/uL (ref 0.0–0.5)
HEMATOCRIT: 45.2 % (ref 34.8–46.6)
HGB: 15.3 g/dL (ref 11.6–15.9)
LYMPH%: 39.2 % (ref 14.0–49.7)
MCH: 29.9 pg (ref 25.1–34.0)
MCHC: 33.8 g/dL (ref 31.5–36.0)
MCV: 88.3 fL (ref 79.5–101.0)
MONO#: 0.4 10*3/uL (ref 0.1–0.9)
MONO%: 8.9 % (ref 0.0–14.0)
NEUT#: 2 10*3/uL (ref 1.5–6.5)
NEUT%: 50 % (ref 38.4–76.8)
Platelets: 128 10*3/uL — ABNORMAL LOW (ref 145–400)
RBC: 5.12 10*6/uL (ref 3.70–5.45)
RDW: 13.6 % (ref 11.2–14.5)
WBC: 4.1 10*3/uL (ref 3.9–10.3)
lymph#: 1.6 10*3/uL (ref 0.9–3.3)
nRBC: 0 % (ref 0–0)

## 2015-10-22 MED ORDER — METHYLPREDNISOLONE SODIUM SUCC 40 MG IJ SOLR
INTRAMUSCULAR | Status: AC
  Filled 2015-10-22: qty 1

## 2015-10-22 MED ORDER — DIPHENHYDRAMINE HCL 25 MG PO CAPS
ORAL_CAPSULE | ORAL | Status: AC
Start: 2015-10-22 — End: 2015-10-22
  Filled 2015-10-22: qty 2

## 2015-10-22 MED ORDER — METHYLPREDNISOLONE SODIUM SUCC 125 MG IJ SOLR
40.0000 mg | Freq: Once | INTRAMUSCULAR | Status: AC
  Administered 2015-10-22: 40 mg via INTRAVENOUS

## 2015-10-22 MED ORDER — ACETAMINOPHEN 325 MG PO TABS
ORAL_TABLET | ORAL | Status: AC
  Filled 2015-10-22: qty 2

## 2015-10-22 MED ORDER — DIPHENHYDRAMINE HCL 25 MG PO TABS
50.0000 mg | ORAL_TABLET | Freq: Once | ORAL | Status: AC
  Administered 2015-10-22: 50 mg via ORAL
  Filled 2015-10-22: qty 2

## 2015-10-22 MED ORDER — IMMUNE GLOBULIN (HUMAN) 20 GM/200ML IV SOLN
95.0000 g | Freq: Once | INTRAVENOUS | Status: AC
  Administered 2015-10-22: 95 g via INTRAVENOUS
  Filled 2015-10-22: qty 950

## 2015-10-22 MED ORDER — ACETAMINOPHEN 325 MG PO TABS
650.0000 mg | ORAL_TABLET | Freq: Once | ORAL | Status: AC
  Administered 2015-10-22: 650 mg via ORAL

## 2015-10-22 NOTE — Patient Instructions (Signed)

## 2015-10-22 NOTE — Progress Notes (Signed)
Pt tolerating IVIG. Pt prefers to stay at the 200mg /kg/hr rate (124mls) throughout the rest of the infusion.   1530- Pt tolerated 6hrs of IVIG with 30 min post observation. AVS printed for pt.

## 2015-10-24 ENCOUNTER — Ambulatory Visit: Payer: 59 | Admitting: Internal Medicine

## 2015-11-02 ENCOUNTER — Ambulatory Visit (INDEPENDENT_AMBULATORY_CARE_PROVIDER_SITE_OTHER): Payer: 59 | Admitting: Internal Medicine

## 2015-11-02 ENCOUNTER — Encounter: Payer: Self-pay | Admitting: Internal Medicine

## 2015-11-02 VITALS — BP 126/68 | HR 82 | Ht 64.0 in | Wt 188.0 lb

## 2015-11-02 DIAGNOSIS — J309 Allergic rhinitis, unspecified: Secondary | ICD-10-CM

## 2015-11-02 DIAGNOSIS — J328 Other chronic sinusitis: Secondary | ICD-10-CM | POA: Diagnosis not present

## 2015-11-02 DIAGNOSIS — J302 Other seasonal allergic rhinitis: Secondary | ICD-10-CM

## 2015-11-02 DIAGNOSIS — J3089 Other allergic rhinitis: Principal | ICD-10-CM

## 2015-11-02 NOTE — Patient Instructions (Signed)
I would be comfortable trying IVIG treatments every 8 weeks since you have been doing so well.  There is only 10 % incidence of allergy cross reaction between penicillins and cephalosporins (like Keflex). It would be reasonable to include cephalosporins on the list of medicines you are able to take if needed.

## 2015-11-02 NOTE — Progress Notes (Signed)
Subjective:    Patient ID: Bianca Parker, female    DOB: 07-27-1946, 70 y.o.   MRN: [id number]  HPI 5/2HPI  88 yoF followed for Allergic rhinitis, chronic rhinosinusitis. Hx chemoRx for CLL in remission.  Last here- March 9. She responded to antibiotic, but then got another cold clearing more quickly. Saw Dr Lucia Gaskins- recognized "congestion" left nostril.  Denies infection now. Catches colds very quickly from her grandchild.  Coming today off antihistamines for allergy testing.  Demonstrated immunoglobulin deficiency as of 6/ 2011, residual from her chemotherapy of 8-9 years ago, reviewed.  She has encasings and HEPA filter.  Allergy profile- 11/01/10- IgE < 1.5; no specific elevations. Also low IgG for penicillin  Skin testing- weak positive intradermal reactions to common inhalants.   02/24/11- 39 yoF followed for Allergic rhinitis, chronic rhinosinusitis. Hx chemoRx for CLL in remission Since last here she has done fairly well with fexofenadine. She has had one "sinus infection" since last here. We talked again about non-bacterial sinusitis. She emphases that she gets fever, bed-ridden malaise. Very concerned about her host resistance because of her leukemia hx. We discussed how to culture, risks of allergy and resistance with frequent antibiotics.  Today she feels well- 2 months without an infection. Realizes risks with exposure to her gransdson's virus colds.   07/01/11-  47 yoF followed for Allergic rhinitis, chronic rhinosinusitis. Hx chemoRx for CLL in remission/ Dr Beryle Beams Has had flu vaccine. A cold  has spread to her family. She started herself with saline nasal rinse using boiled well water. Got Zicam lozenges and thinks she fought off the cold. Takes probiotics routinely. Her CLL may not be in remission now. Neutrophils and platelets are down. 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  Hx chemoRx for CLL in remission/ Dr Beryle Beams, acquired hypogammaglobulinemia Recently having ? bronchitis flare-was put on Pred pak from PCP. No sinus congestion or sinus trouble. Somewhat better but not completly gone. She chronic bronchitis 3 weeks ago from her husband- prednisone taper from [place]. Did shake off earlier cold. Was treated with Avelox in September. Now scant white phlegm or pale green. Some rattle. No fever or chills. Mucinex DM helps. Dr Beryle Beams heme-onc treating acquired hypogammaglobulinemia with IVIG. Her CLL remains in remission.  02/15/13- 67 yoF followed for Allergic rhinitis, chronic rhinosinusitis. Hx chemoRx for CLL in remission/ Dr Beryle Beams, acquired hypogammaglobulinemia ACUTE VISIT: recently having flare ups; allergy flare ups as well. Stayed congested and stuffy -almost like a cold. Blames spring allergy season. Home has a new heating system and duct. Feeling better with summer weather. Benadryl and Allegra daily. Flonase raised glaucoma pressure. Does Neti pot with 1-2 drops of Afrin.  07/05/13- 67 yoF followed for Allergic rhinitis, chronic rhinosinusitis. Hx chemoRx for CLL in remission/ Dr Beryle Beams, acquired hypogammaglobulinemia FOLLOWS FOR: 5 month follow up.  does report some head congestion with clear mucus, some PND, some prod cough x3 weeks.  denies wheezing, increased SOB, f/c/s. Off IVIG. Fought off a cold without antibiotics. Diagnosed borderline glaucoma. Uses saline nasal rinse if needed.  06/14/14-  67 yoF followed for Allergic rhinitis, chronic rhinosinusitis. Hx chemoRx for CLL in remission, acquired hypogammaglobulinemia FOLLOWS FOR: Allergies are kicking in more this week. Should she change her meds. Immunoglobulin deficiency. Oncologist considered IVIG but patient has felt very well and wanted to wait. She emphasizes healthy life style and only needed antibiotic once since last visit., Hiking. Takes  Zicam at onset of colds. IVIG  only is sick in hospital. She sought opinion from [facility] . Noting only minor head congestion, nothing discolored.   09/18/14- 67 yoF followed for Allergic rhinitis, chronic rhinosinusitis. Hx chemoRx for CLL in remission, acquired hypogammaglobulinemia ACUTE VISIT: ear pain with sore throat as well-started Saturday morning. Currently on Biaxin abx-2 doses left. She reports 3 distinct colds since mid December, each becoming purulent and each responding first a Z-Pak then doxycycline and most recently Biaxin. With each episode she had conjunctivitis treated by her eye doctor. Now 2 days of left earache and sore throat. She has used her Ciprodex.  12/12/14-69 yoF followed for Allergic rhinitis, chronic rhinosinusitis. Hx chemoRx for CLL in remission, acquired hypogammaglobulinemia FOLLOW FOR: patient feeling much better.  discuss allergies.  Had repeated eye infections and respiratory infections this winter but all resolved now. Complains of "allergy" waking in the morning with cough, postnasal drip, ear pain relieved by popping ears, white sputum. Clears after a few hours. Taking fexofenadine each morning *An Additional problem-asks clarification drug allergy. History of rash with penicillin and sulfa, anaphylaxis with pentamidine.  06/18/15- 86 yoF followed for Allergic rhinitis, chronic rhinosinusitis. Hx chemoRx for CLL in remission, acquired hypogammaglobulinemia/ IVIG On IVIG. Dr Gearldine Bienenstock doxy for sinus infection in September and asks about plans for dealing with acute infections.  Up to date on flu and PVAX. FOLLOWS FOR: still has a cough from a sinus infection 5 weeks treated by Oncology.  cough is dry at times with spasms with occasional vomiting, sometimes clear white mucus prod in the AM.  Oncology is wondering about a referral to ID to manage infections  11/02/2015-70 year old female followed for allergic rhinitis, chronic rhinosinusitis. Complicated by history chemotherapy for CLL  in remission, acquired hypogammaglobulinemia/IVIG FOLLOWS FOR: pt doing well today- no acute breathing complaints.  Wants to discuss her IV IG treatment.  So far no major infections this winter. She continues IVIG/ Dr Alvy Bimler. History of penicillin allergy but okay with cephalosporins. Mild postnasal drip with no more chronic cough.  CXR 06/18/2015-NAD with incidental DGD mid thoracic spine   Review of Systems- see HPI Constitutional:   No-   weight loss, night sweats, fevers, chills, fatigue, lassitude. HEENT:   No-  headaches, difficulty swallowing, tooth/dental problems, sore throat,       No-  sneezing, itching, +ear ache, +nasal congestion, +ost nasal drip,  CV:  No-   chest pain, orthopnea, PND, swelling in lower extremities, anasarca, dizziness, palpitations Resp: No-   shortness of breath with exertion or at rest.              +  productive cough,   non-productive cough,  No- coughing up of blood.              No-change in color of mucus.  No- wheezing.   Skin: No-   rash or lesions. GI:  No-   heartburn, indigestion, abdominal pain, nausea, vomiting,  GU: . MS:  No-   joint pain or swelling. Neuro-     nothing unusual Psych:  No- change in mood or affect. No depression or anxiety.  No memory loss.  Objective:   Physical Exam General- Alert, Oriented, Affect-appropriate, Distress- none acute; well appearing,                 medium Build. Looks well.  Skin- rash-none, lesions- none, excoriation- none Lymphadenopathy- none Head- atraumatic  Eyes- Gross vision intact, PERRLA, conjunctivae clear secretions            Ears- Hearing, canals-normal. Sclerosis of TMs-no inflammation or fluid            Nose- +turbinate edema, no-Septal dev, mucus, polyps, erosion, perforation             Throat- Mallampati II , mucosa + red without exudate , drainage- none, tonsils- atrophic Neck- flexible , trachea midline, no stridor , thyroid nl, carotid no bruit Chest - symmetrical  excursion , unlabored           Heart/CV- RRR , no murmur , no gallop  , no rub, nl s1 s2                           - JVD- none , edema- none, stasis changes- none, varices- none           Lung- clear to P&A, wheeze- none, cough-none , dullness-none, rub- none           Chest wall-  Abd-  Br/ Gen/ Rectal- Not done, not indicated Extrem- cyanosis- none, clubbing, none, atrophy- none, strength- nl Neuro- grossly intact to observation

## 2015-11-02 NOTE — Assessment & Plan Note (Signed)
Some drainage but not bad so far this spring. Discussed antihistamines and nasal steroid sprays.

## 2015-11-02 NOTE — Assessment & Plan Note (Addendum)
Control has been better using IVIG LAN because of cost, I suggested she talk with Dr. Alvy Bimler about increasing her administration interval to every 2 months at least during the summer months.

## 2015-11-19 ENCOUNTER — Other Ambulatory Visit: Payer: Self-pay | Admitting: *Deleted

## 2015-11-19 ENCOUNTER — Other Ambulatory Visit (HOSPITAL_BASED_OUTPATIENT_CLINIC_OR_DEPARTMENT_OTHER): Payer: 59

## 2015-11-19 ENCOUNTER — Ambulatory Visit (HOSPITAL_BASED_OUTPATIENT_CLINIC_OR_DEPARTMENT_OTHER): Payer: 59

## 2015-11-19 VITALS — BP 138/64 | HR 73 | Temp 98.0°F | Resp 16 | Wt 183.8 lb

## 2015-11-19 DIAGNOSIS — C9111 Chronic lymphocytic leukemia of B-cell type in remission: Secondary | ICD-10-CM

## 2015-11-19 DIAGNOSIS — D801 Nonfamilial hypogammaglobulinemia: Secondary | ICD-10-CM | POA: Diagnosis not present

## 2015-11-19 DIAGNOSIS — C801 Malignant (primary) neoplasm, unspecified: Secondary | ICD-10-CM | POA: Diagnosis not present

## 2015-11-19 LAB — CBC WITH DIFFERENTIAL/PLATELET
BASO%: 0 % (ref 0.0–2.0)
Basophils Absolute: 0 10*3/uL (ref 0.0–0.1)
EOS ABS: 0.1 10*3/uL (ref 0.0–0.5)
EOS%: 1.7 % (ref 0.0–7.0)
HEMATOCRIT: 44.3 % (ref 34.8–46.6)
HGB: 14.8 g/dL (ref 11.6–15.9)
LYMPH%: 41.7 % (ref 14.0–49.7)
MCH: 29.7 pg (ref 25.1–34.0)
MCHC: 33.4 g/dL (ref 31.5–36.0)
MCV: 88.8 fL (ref 79.5–101.0)
MONO#: 0.4 10*3/uL (ref 0.1–0.9)
MONO%: 10.2 % (ref 0.0–14.0)
NEUT%: 46.4 % (ref 38.4–76.8)
NEUTROS ABS: 2 10*3/uL (ref 1.5–6.5)
PLATELETS: 145 10*3/uL (ref 145–400)
RBC: 4.99 10*6/uL (ref 3.70–5.45)
RDW: 13.9 % (ref 11.2–14.5)
WBC: 4.2 10*3/uL (ref 3.9–10.3)
lymph#: 1.8 10*3/uL (ref 0.9–3.3)
nRBC: 0 % (ref 0–0)

## 2015-11-19 MED ORDER — ALTEPLASE 2 MG IJ SOLR
2.0000 mg | Freq: Once | INTRAMUSCULAR | Status: DC | PRN
  Filled 2015-11-19: qty 2

## 2015-11-19 MED ORDER — DIPHENHYDRAMINE HCL 25 MG PO TABS
50.0000 mg | ORAL_TABLET | Freq: Once | ORAL | Status: AC
  Administered 2015-11-19: 50 mg via ORAL
  Filled 2015-11-19: qty 2

## 2015-11-19 MED ORDER — SODIUM CHLORIDE 0.9 % IJ SOLN
3.0000 mL | Freq: Once | INTRAMUSCULAR | Status: DC | PRN
  Filled 2015-11-19: qty 10

## 2015-11-19 MED ORDER — SODIUM CHLORIDE 0.9 % IV SOLN
INTRAVENOUS | Status: DC
Start: 2015-11-19 — End: 2015-11-19
  Administered 2015-11-19: 09:00:00 via INTRAVENOUS

## 2015-11-19 MED ORDER — SODIUM CHLORIDE 0.9 % IJ SOLN
10.0000 mL | INTRAMUSCULAR | Status: DC | PRN
  Filled 2015-11-19: qty 10

## 2015-11-19 MED ORDER — ACETAMINOPHEN 325 MG PO TABS
ORAL_TABLET | ORAL | Status: AC
  Filled 2015-11-19: qty 2

## 2015-11-19 MED ORDER — DIPHENHYDRAMINE HCL 25 MG PO CAPS
ORAL_CAPSULE | ORAL | Status: AC
  Filled 2015-11-19: qty 2

## 2015-11-19 MED ORDER — METHYLPREDNISOLONE SODIUM SUCC 40 MG IJ SOLR
INTRAMUSCULAR | Status: AC
  Filled 2015-11-19: qty 1

## 2015-11-19 MED ORDER — IMMUNE GLOBULIN (HUMAN) 10 GM/100ML IV SOLN: 1.0000 g/kg | Freq: Once | INTRAVENOUS | Status: DC

## 2015-11-19 MED ORDER — METHYLPREDNISOLONE SODIUM SUCC 125 MG IJ SOLR: 40.0000 mg | Freq: Once | INTRAMUSCULAR | Status: DC

## 2015-11-19 MED ORDER — HEPARIN SOD (PORK) LOCK FLUSH 100 UNIT/ML IV SOLN
500.0000 [IU] | Freq: Once | INTRAVENOUS | Status: DC | PRN
  Filled 2015-11-19: qty 5

## 2015-11-19 MED ORDER — ACETAMINOPHEN 325 MG PO TABS
650.0000 mg | ORAL_TABLET | Freq: Once | ORAL | Status: AC
  Administered 2015-11-19: 650 mg via ORAL

## 2015-11-19 MED ORDER — METHYLPREDNISOLONE SODIUM SUCC 40 MG IJ SOLR
40.0000 mg | Freq: Once | INTRAMUSCULAR | Status: AC
  Administered 2015-11-19: 40 mg via INTRAVENOUS

## 2015-11-19 MED ORDER — PRIVIGEN 10 GM/100ML IV SOLN
1.0000 g/kg | Freq: Once | INTRAVENOUS | Status: AC
  Administered 2015-11-19: 85 g via INTRAVENOUS
  Filled 2015-11-19: qty 700

## 2015-11-19 MED ORDER — HEPARIN SOD (PORK) LOCK FLUSH 100 UNIT/ML IV SOLN
250.0000 [IU] | Freq: Once | INTRAVENOUS | Status: DC | PRN
  Filled 2015-11-19: qty 5

## 2015-11-19 NOTE — Progress Notes (Signed)
Pt preference to give Privigen slow & used 0.3 ml/kg/h x 15 min, then 0.18ml/kg/hr x 15 min & then 136ml/hr x 30 min.

## 2015-11-19 NOTE — Patient Instructions (Signed)

## 2015-12-17 ENCOUNTER — Ambulatory Visit (HOSPITAL_BASED_OUTPATIENT_CLINIC_OR_DEPARTMENT_OTHER): Payer: 59 | Admitting: Hematology and Oncology

## 2015-12-17 ENCOUNTER — Other Ambulatory Visit (HOSPITAL_BASED_OUTPATIENT_CLINIC_OR_DEPARTMENT_OTHER): Payer: 59

## 2015-12-17 ENCOUNTER — Telehealth: Payer: Self-pay | Admitting: Hematology and Oncology

## 2015-12-17 ENCOUNTER — Ambulatory Visit (HOSPITAL_BASED_OUTPATIENT_CLINIC_OR_DEPARTMENT_OTHER): Payer: 59

## 2015-12-17 VITALS — BP 126/57 | HR 102 | Temp 97.8°F | Resp 17 | Ht 64.0 in | Wt 175.3 lb

## 2015-12-17 VITALS — BP 118/61 | HR 68 | Temp 97.1°F | Resp 18

## 2015-12-17 DIAGNOSIS — D801 Nonfamilial hypogammaglobulinemia: Secondary | ICD-10-CM

## 2015-12-17 DIAGNOSIS — D696 Thrombocytopenia, unspecified: Secondary | ICD-10-CM | POA: Diagnosis not present

## 2015-12-17 DIAGNOSIS — C9111 Chronic lymphocytic leukemia of B-cell type in remission: Secondary | ICD-10-CM

## 2015-12-17 LAB — CBC WITH DIFFERENTIAL/PLATELET
BASO%: 0 % (ref 0.0–2.0)
BASOS ABS: 0 10*3/uL (ref 0.0–0.1)
EOS%: 1.2 % (ref 0.0–7.0)
Eosinophils Absolute: 0.1 10*3/uL (ref 0.0–0.5)
HCT: 43.8 % (ref 34.8–46.6)
HGB: 14.7 g/dL (ref 11.6–15.9)
LYMPH#: 1.7 10*3/uL (ref 0.9–3.3)
LYMPH%: 41.7 % (ref 14.0–49.7)
MCH: 29.8 pg (ref 25.1–34.0)
MCHC: 33.6 g/dL (ref 31.5–36.0)
MCV: 88.8 fL (ref 79.5–101.0)
MONO#: 0.3 10*3/uL (ref 0.1–0.9)
MONO%: 7.5 % (ref 0.0–14.0)
NEUT#: 2.1 10*3/uL (ref 1.5–6.5)
NEUT%: 49.6 % (ref 38.4–76.8)
PLATELETS: 123 10*3/uL — AB (ref 145–400)
RBC: 4.93 10*6/uL (ref 3.70–5.45)
RDW: 13.9 % (ref 11.2–14.5)
WBC: 4.2 10*3/uL (ref 3.9–10.3)
nRBC: 0 % (ref 0–0)

## 2015-12-17 MED ORDER — METHYLPREDNISOLONE SODIUM SUCC 125 MG IJ SOLR
INTRAMUSCULAR | Status: AC
  Filled 2015-12-17: qty 2

## 2015-12-17 MED ORDER — IMMUNE GLOBULIN (HUMAN) 20 GM/200ML IV SOLN
1.0000 g/kg | Freq: Once | INTRAVENOUS | Status: AC
  Administered 2015-12-17: 80 g via INTRAVENOUS
  Filled 2015-12-17: qty 800

## 2015-12-17 MED ORDER — ACETAMINOPHEN 325 MG PO TABS
ORAL_TABLET | ORAL | Status: AC
  Filled 2015-12-17: qty 2

## 2015-12-17 MED ORDER — METHYLPREDNISOLONE SODIUM SUCC 125 MG IJ SOLR
40.0000 mg | Freq: Once | INTRAMUSCULAR | Status: AC
  Administered 2015-12-17: 40 mg via INTRAVENOUS

## 2015-12-17 MED ORDER — ACETAMINOPHEN 325 MG PO TABS
650.0000 mg | ORAL_TABLET | Freq: Once | ORAL | Status: AC
  Administered 2015-12-17: 650 mg via ORAL

## 2015-12-17 MED ORDER — DIPHENHYDRAMINE HCL 25 MG PO CAPS
ORAL_CAPSULE | ORAL | Status: AC
  Filled 2015-12-17: qty 2

## 2015-12-17 MED ORDER — DIPHENHYDRAMINE HCL 25 MG PO TABS
50.0000 mg | ORAL_TABLET | Freq: Once | ORAL | Status: AC
  Administered 2015-12-17: 50 mg via ORAL
  Filled 2015-12-17: qty 2

## 2015-12-17 NOTE — Assessment & Plan Note (Signed)
The cause is unknown, could be due to consumption or treatment related It is mild and there is little change compared from previous platelet count.  She is asymptomatic from the thrombocytopenia. I will observe for now.  

## 2015-12-17 NOTE — Progress Notes (Signed)
Called Dr Alvy Bimler to discuss pt's rate on IVIG.  Pt started with IVIG @ 1200 & given slowly per pt request.  Pt still has @ 500 ml in bag & will be late leaving infusion room.  Per Dr Alvy Bimler, [place] to increase rate to 400mg /kg/hr to get IVIG in a timely manner.  Pt has tol. IVIG so far-only symptoms feeling whoozy & flushed once with stable VS.

## 2015-12-17 NOTE — Patient Instructions (Signed)
Cytomegalovirus Immune Globulin, CMV-IGIV injection What is this medicine? CYTOMEGALOVIRUS IMMUNE GLOBULIN (sye toe MEG a loe vye rus im MUNE GLOB yoo lin) is used to prevent infections of cytomegalovirus after an organ transplant. This medicine may be used for other purposes; ask your health care provider or pharmacist if you have questions. What should I tell my health care provider before I take this medicine? They need to know if you have any of these conditions: -diabetes -kidney disease -low levels of immunoglobulin A in the body -an unusual or allergic reaction to human immune globulin, albumin, sucrose, other medicines, foods, dyes, or preservatives -pregnant or trying to get pregnant -breast-feeding How should I use this medicine? This medicine is for infusion into a vein. It is given by a health care professional in a hospital or clinic setting. Talk to your pediatrician regarding the use of this medicine in children. Special care may be needed. Overdosage: If you think you have taken too much of this medicine contact a poison control center or emergency room at once. NOTE: This medicine is only for you. Do not share this medicine with others. What if I miss a dose? It is important not to miss your dose. Call your doctor or health care professional if you are unable to keep an appointment. What may interact with this medicine? -live virus vaccines, like measles, mumps, or rubella This list may not describe all possible interactions. Give your health care provider a list of all the medicines, herbs, non-prescription drugs, or dietary supplements you use. Also tell them if you smoke, drink alcohol, or use illegal drugs. Some items may interact with your medicine. What should I watch for while using this medicine? Your condition will be monitored carefully while you are receiving this medicine. This medicine is made from human blood. It may be possible to pass an infection in this  medicine. Talk to your doctor about the risks and benefits of this medicine. What side effects may I notice from receiving this medicine? Side effects that you should report to your doctor or health care professional as soon as possible: -allergic reactions like skin rash, itching or hives, swelling of the face, lips, or tongue -back or stomach pains -breathing problems -chest pain or tightness -feeling faint or lightheaded, falls -fever, chills -seizures -sudden weight gain -swelling of the ankles, feet, hands -trouble passing urine or change in the amount of urine -unusual bleeding or bruising Side effects that usually do not require medical attention (report to your doctor or health care professional if they continue or are bothersome): -flushing -muscle cramps, pains -nausea or vomiting This list may not describe all possible side effects. Call your doctor for medical advice about side effects. You may report side effects to FDA at 1-800-FDA-1088. Where should I keep my medicine? This drug is given in a hospital or clinic and will not be stored at home. NOTE: This sheet is a summary. It may not cover all possible information. If you have questions about this medicine, talk to your doctor, pharmacist, or health care provider.    2016, Elsevier/Gold Standard. (2007-11-15 17:01:23)

## 2015-12-17 NOTE — Assessment & Plan Note (Signed)
Clinically, she remained in remission. Continues annual follow-up with history, physical examination and blood work.  

## 2015-12-17 NOTE — Assessment & Plan Note (Signed)
This is related to long-term side effects from prior chemotherapy. She has recurrent infection with herpesvirus, bronchitis and skin infections She is already on chronic treatment with Valtrex daily. She is doing well and will continue IVIG treatment at 1 g/kg every [redacted] weeks along with premedication with Benadryl, Tylenol and Solu-Medrol. The risk, benefit, side effects of IVIG is fully discussed with the patient and she agreed to proceed. Since IVIG treatment, she denies recurrence of infection. We will continue the same.

## 2015-12-17 NOTE — Telephone Encounter (Signed)
Gave and printed appt sched and avs for pt for may thru OCT

## 2015-12-17 NOTE — Progress Notes (Signed)
[facility] OFFICE PROGRESS NOTE  Patient Care Team: Haywood Pao, MD as PCP - General (Internal Medicine) Deneise Lever, MD as Referring Physician (Pulmonary Disease) Rozetta Nunnery, MD as Referring Physician (Otolaryngology) Milus Banister, MD (Gastroenterology) Arvella Nigh, MD (Obstetrics and Gynecology) Heath Lark, MD as Consulting Physician (Hematology and Oncology) Jari Pigg, MD as Consulting Physician (Dermatology) Marygrace Drought, MD as Consulting Physician (Ophthalmology)  SUMMARY OF ONCOLOGIC HISTORY:  She was in one of the first cohorts of patients treated with FCR on protocol at the [facility] at time of her initial diagnosis in July 2002. Technically she had RAI  stage III disease due to anemia but she had no organomegaly or lymphadenopathy on exam. FCR was started in 12/2001, and continued through 06/2002. She achieved a complete hematologic response and,which has been durable now for over 11 years!  She did develop hypogammaglobulinemia. As a result, she gets recurrent bronchitis and sinusitis. In April 2013 she was started her on monthly intravenous immunoglobulin 400 mg per kilogram and this has significantly impacted on the frequency and severity of her infections. This was continued through April of 2014. She has had no new infections since stopping the IVIG but as expected, immunoglobulin levels have fallen to pretreatment levels.   She has a history of her recurrent squamous cell carcinomas of her skin and has had 2 lesions excised from her left tibial area and another 1 excised from the vulvar area. Recently, she had a basal cell carcinoma removed. She has a history of both herpes zoster and herpes simplex infections. Recently, she had abnormal Pap smear and currently on close observation. Recent colposcopy was negative.  She received IVIG treatment for acquired hypogammaglobulinemia but stopped in April 2014 due to sick serum  syndrome In July 2016, IVIG is resumed with premedication In August 2016, she developed cellulitis from venipuncture, resolved with antibiotics  INTE tRVAL HISTORY: Please see below for problem oriented charting.  she feels well. No new lymphadenopathy. Denies recurrent infection. She has very rare occasional hemorrhoidal bleeding, resolved spontaneously   REVIEW OF SYSTEMS:   Constitutional: Denies fevers, chills or abnormal weight loss Eyes: Denies blurriness of vision Ears, nose, mouth, throat, and face: Denies mucositis or sore throat Respiratory: Denies cough, dyspnea or wheezes Cardiovascular: Denies palpitation, chest discomfort or lower extremity swelling Gastrointestinal:  Denies nausea, heartburn or change in bowel habits Skin: Denies abnormal skin rashes Lymphatics: Denies new lymphadenopathy or easy bruising Neurological:Denies numbness, tingling or new weaknesses Behavioral/Psych: Mood is stable, no new changes  All other systems were reviewed with the patient and are negative.  I have reviewed the past medical history, past surgical history, social history and family history with the patient and they are unchanged from previous note.  ALLERGIES:  is allergic to penicillins; pentamidine; and sulfonamide derivatives.  MEDICATIONS:  Current Outpatient Prescriptions  Medication Sig Dispense Refill  . Calcium Carbonate-Vitamin D (CALTRATE 600+D) 600-400 MG-UNIT per tablet Take 1 tablet by mouth daily.    . fexofenadine (ALLEGRA) 180 MG tablet Take 180 mg by mouth daily.      Marland Kitchen guaiFENesin (MUCINEX) 600 MG 12 hr tablet Take by mouth daily as needed. Reported on 08/24/2015    . levothyroxine (SYNTHROID, LEVOTHROID) 50 MCG tablet     . Multiple Vitamin (MULTIVITAMIN) tablet Take 1 tablet by mouth daily.    . mupirocin ointment (BACTROBAN) 2 %     . valACYclovir (VALTREX) 500 MG tablet Take 500 mg  by mouth as needed.     No current facility-administered medications for  this visit.    PHYSICAL EXAMINATION: ECOG PERFORMANCE STATUS: 0 - Asymptomatic  Filed Vitals:   12/17/15 0915  BP: 126/57  Pulse: 102  Temp: 97.8 F (36.6 C)  Resp: 17   Filed Weights   12/17/15 0915  Weight: 175 lb 4.8 oz (79.516 kg)    GENERAL:alert, no distress and comfortable SKIN: skin color, texture, turgor are normal, no rashes or significant lesions EYES: normal, Conjunctiva are pink and non-injected, sclera clear Musculoskeletal:no cyanosis of digits and no clubbing  NEURO: alert & oriented x 3 with fluent speech, no focal motor/sensory deficits  LABORATORY DATA:  I have reviewed the data as listed    Component Value Date/Time   NA 140 06/04/2015 0829   NA 141 05/22/2014 0737   K 3.8 06/04/2015 0829   K 4.3 05/22/2014 0737   CL 104 05/22/2014 0737   CL 109* 01/20/2013 0801   CO2 21* 06/04/2015 0829   CO2 30 05/22/2014 0737   GLUCOSE 120 06/04/2015 0829   GLUCOSE 80 05/22/2014 0737   GLUCOSE 89 01/20/2013 0801   BUN 18.8 06/04/2015 0829   BUN 19 05/22/2014 0737   CREATININE 1.1 06/04/2015 0829   CREATININE 1.0 05/22/2014 0737   CALCIUM 9.4 06/04/2015 0829   CALCIUM 9.5 05/22/2014 0737   PROT 7.1 06/04/2015 0829   PROT 6.1 05/22/2014 0737   ALBUMIN 3.7 06/04/2015 0829   ALBUMIN 4.4 05/22/2014 0737   AST 34 06/04/2015 0829   AST 33 05/22/2014 0737   ALT 33 06/04/2015 0829   ALT 40* 05/22/2014 0737   ALKPHOS 51 06/04/2015 0829   ALKPHOS 40 05/22/2014 0737   BILITOT 0.42 06/04/2015 0829   BILITOT 0.9 05/22/2014 0737   GFRNONAA 57* 04/18/2009 0943   GFRAA  04/18/2009 0943    >60        The eGFR has been calculated using the MDRD equation. This calculation has not been validated in all clinical situations. eGFR's persistently <60 mL/min signify possible Chronic Kidney Disease.    No results found for: SPEP, UPEP  Lab Results  Component Value Date   WBC 4.2 12/17/2015   NEUTROABS 2.1 12/17/2015   HGB 14.7 12/17/2015   HCT 43.8 12/17/2015    MCV 88.8 12/17/2015   PLT 123* 12/17/2015      Chemistry      Component Value Date/Time   NA 140 06/04/2015 0829   NA 141 05/22/2014 0737   K 3.8 06/04/2015 0829   K 4.3 05/22/2014 0737   CL 104 05/22/2014 0737   CL 109* 01/20/2013 0801   CO2 21* 06/04/2015 0829   CO2 30 05/22/2014 0737   BUN 18.8 06/04/2015 0829   BUN 19 05/22/2014 0737   CREATININE 1.1 06/04/2015 0829   CREATININE 1.0 05/22/2014 0737   GLU 92 06/17/2013      Component Value Date/Time   CALCIUM 9.4 06/04/2015 0829   CALCIUM 9.5 05/22/2014 0737   ALKPHOS 51 06/04/2015 0829   ALKPHOS 40 05/22/2014 0737   AST 34 06/04/2015 0829   AST 33 05/22/2014 0737   ALT 33 06/04/2015 0829   ALT 40* 05/22/2014 0737   BILITOT 0.42 06/04/2015 0829   BILITOT 0.9 05/22/2014 0737     ASSESSMENT & PLAN:  Chronic lymphoid leukemia in remission Clinically, she remained in remission. Continues annual follow-up with history, physical examination and blood work.   Hypogammaglobulinemia, acquired This is related to long-term  side effects from prior chemotherapy. She has recurrent infection with herpesvirus, bronchitis and skin infections She is already on chronic treatment with Valtrex daily. She is doing well and will continue IVIG treatment at 1 g/kg every [redacted] weeks along with premedication with Benadryl, Tylenol and Solu-Medrol. The risk, benefit, side effects of IVIG is fully discussed with the patient and she agreed to proceed. Since IVIG treatment, she denies recurrence of infection. We will continue the same.  Thrombocytopenia ([place]) The cause is unknown, could be due to consumption or treatment related It is mild and there is little change compared from previous platelet count.  She is asymptomatic from the thrombocytopenia. I will observe for now.    Orders Placed This Encounter  Procedures  . CBC with Differential/Platelet    Standing Status: Future     Number of Occurrences:      Standing Expiration Date:  01/20/2017  . IgG, IgA, IgM    Standing Status: Future     Number of Occurrences:      Standing Expiration Date: 01/20/2017   All questions were answered. The patient knows to call the clinic with any problems, questions or concerns. No barriers to learning was detected. I spent 15 minutes counseling the patient face to face. The total time spent in the appointment was 20 minutes and more than 50% was on counseling and review of test results     [facility], [place], MD 12/17/2015 9:42 AM

## 2016-01-14 ENCOUNTER — Ambulatory Visit (HOSPITAL_BASED_OUTPATIENT_CLINIC_OR_DEPARTMENT_OTHER): Payer: 59

## 2016-01-14 VITALS — BP 112/58 | HR 65 | Temp 98.2°F | Resp 18

## 2016-01-14 DIAGNOSIS — D801 Nonfamilial hypogammaglobulinemia: Secondary | ICD-10-CM

## 2016-01-14 MED ORDER — METHYLPREDNISOLONE SODIUM SUCC 40 MG IJ SOLR
INTRAMUSCULAR | Status: AC
  Filled 2016-01-14: qty 1

## 2016-01-14 MED ORDER — ACETAMINOPHEN 325 MG PO TABS
ORAL_TABLET | ORAL | Status: AC
  Filled 2016-01-14: qty 2

## 2016-01-14 MED ORDER — IMMUNE GLOBULIN (HUMAN) 20 GM/200ML IV SOLN
80.0000 g | Freq: Once | INTRAVENOUS | Status: AC
  Administered 2016-01-14: 80 g via INTRAVENOUS
  Filled 2016-01-14: qty 800

## 2016-01-14 MED ORDER — ACETAMINOPHEN 325 MG PO TABS
650.0000 mg | ORAL_TABLET | Freq: Once | ORAL | Status: AC
  Administered 2016-01-14: 650 mg via ORAL

## 2016-01-14 MED ORDER — DIPHENHYDRAMINE HCL 25 MG PO TABS
50.0000 mg | ORAL_TABLET | Freq: Once | ORAL | Status: AC
  Administered 2016-01-14: 50 mg via ORAL
  Filled 2016-01-14: qty 2

## 2016-01-14 MED ORDER — DIPHENHYDRAMINE HCL 25 MG PO CAPS
ORAL_CAPSULE | ORAL | Status: AC
  Filled 2016-01-14: qty 2

## 2016-01-14 MED ORDER — METHYLPREDNISOLONE SODIUM SUCC 125 MG IJ SOLR
40.0000 mg | Freq: Once | INTRAMUSCULAR | Status: AC
Start: 2016-01-14 — End: 2016-01-14
  Administered 2016-01-14: 40 mg via INTRAVENOUS

## 2016-01-14 NOTE — Patient Instructions (Signed)
Cytomegalovirus Immune Globulin, CMV-IGIV injection What is this medicine? CYTOMEGALOVIRUS IMMUNE GLOBULIN (sye toe MEG a loe vye rus im MUNE GLOB yoo lin) is used to prevent infections of cytomegalovirus after an organ transplant. This medicine may be used for other purposes; ask your health care provider or pharmacist if you have questions. What should I tell my health care provider before I take this medicine? They need to know if you have any of these conditions: -diabetes -kidney disease -low levels of immunoglobulin A in the body -an unusual or allergic reaction to human immune globulin, albumin, sucrose, other medicines, foods, dyes, or preservatives -pregnant or trying to get pregnant -breast-feeding How should I use this medicine? This medicine is for infusion into a vein. It is given by a health care professional in a hospital or clinic setting. Talk to your pediatrician regarding the use of this medicine in children. Special care may be needed. Overdosage: If you think you have taken too much of this medicine contact a poison control center or emergency room at once. NOTE: This medicine is only for you. Do not share this medicine with others. What if I miss a dose? It is important not to miss your dose. Call your doctor or health care professional if you are unable to keep an appointment. What may interact with this medicine? -live virus vaccines, like measles, mumps, or rubella This list may not describe all possible interactions. Give your health care provider a list of all the medicines, herbs, non-prescription drugs, or dietary supplements you use. Also tell them if you smoke, drink alcohol, or use illegal drugs. Some items may interact with your medicine. What should I watch for while using this medicine? Your condition will be monitored carefully while you are receiving this medicine. This medicine is made from human blood. It may be possible to pass an infection in this  medicine. Talk to your doctor about the risks and benefits of this medicine. What side effects may I notice from receiving this medicine? Side effects that you should report to your doctor or health care professional as soon as possible: -allergic reactions like skin rash, itching or hives, swelling of the face, lips, or tongue -back or stomach pains -breathing problems -chest pain or tightness -feeling faint or lightheaded, falls -fever, chills -seizures -sudden weight gain -swelling of the ankles, feet, hands -trouble passing urine or change in the amount of urine -unusual bleeding or bruising Side effects that usually do not require medical attention (report to your doctor or health care professional if they continue or are bothersome): -flushing -muscle cramps, pains -nausea or vomiting This list may not describe all possible side effects. Call your doctor for medical advice about side effects. You may report side effects to FDA at 1-800-FDA-1088. Where should I keep my medicine? This drug is given in a hospital or clinic and will not be stored at home. NOTE: This sheet is a summary. It may not cover all possible information. If you have questions about this medicine, talk to your doctor, pharmacist, or health care provider.    2016, Elsevier/Gold Standard. (2007-11-15 17:01:23)

## 2016-01-14 NOTE — Progress Notes (Signed)
Discussed rates of IVIG with pharmacy. During this infusion rates increased every 30 minutes. Max rate of IVIG increased to 240 ml/hr (3 mg/kg/hr); ok per pharmacy. Pt tolerated infusion well with no complications. Kept pt 30 minutes post infusion and vitals remained stable. Educated pt to call with any concerns and pt verbalized understanding.

## 2016-01-16 ENCOUNTER — Ambulatory Visit: Payer: 59 | Admitting: Hematology and Oncology

## 2016-01-16 ENCOUNTER — Other Ambulatory Visit: Payer: 59

## 2016-02-11 ENCOUNTER — Ambulatory Visit (HOSPITAL_BASED_OUTPATIENT_CLINIC_OR_DEPARTMENT_OTHER): Payer: 59

## 2016-02-11 VITALS — BP 117/67 | HR 70 | Temp 98.7°F | Resp 16

## 2016-02-11 DIAGNOSIS — D801 Nonfamilial hypogammaglobulinemia: Secondary | ICD-10-CM | POA: Diagnosis not present

## 2016-02-11 MED ORDER — DIPHENHYDRAMINE HCL 25 MG PO CAPS
ORAL_CAPSULE | ORAL | Status: AC
  Filled 2016-02-11: qty 2

## 2016-02-11 MED ORDER — METHYLPREDNISOLONE SODIUM SUCC 40 MG IJ SOLR
40.0000 mg | INTRAMUSCULAR | Status: DC
  Administered 2016-02-11: 40 mg via INTRAVENOUS

## 2016-02-11 MED ORDER — DIPHENHYDRAMINE HCL 25 MG PO TABS
50.0000 mg | ORAL_TABLET | Freq: Once | ORAL | Status: AC
  Administered 2016-02-11: 50 mg via ORAL
  Filled 2016-02-11: qty 2

## 2016-02-11 MED ORDER — IMMUNE GLOBULIN (HUMAN) 20 GM/200ML IV SOLN
80.0000 g | Freq: Once | INTRAVENOUS | Status: AC
  Administered 2016-02-11: 80 g via INTRAVENOUS
  Filled 2016-02-11: qty 800

## 2016-02-11 MED ORDER — METHYLPREDNISOLONE SODIUM SUCC 125 MG IJ SOLR: 40.0000 mg | Freq: Once | INTRAMUSCULAR | Status: DC

## 2016-02-11 MED ORDER — ACETAMINOPHEN 325 MG PO TABS
ORAL_TABLET | ORAL | Status: AC
  Filled 2016-02-11: qty 2

## 2016-02-11 MED ORDER — METHYLPREDNISOLONE SODIUM SUCC 40 MG IJ SOLR
INTRAMUSCULAR | Status: AC
  Filled 2016-02-11: qty 1

## 2016-02-11 MED ORDER — METHYLPREDNISOLONE SODIUM SUCC 125 MG IJ SOLR
INTRAMUSCULAR | Status: AC
  Filled 2016-02-11: qty 2

## 2016-02-11 MED ORDER — ACETAMINOPHEN 325 MG PO TABS
650.0000 mg | ORAL_TABLET | Freq: Once | ORAL | Status: AC
  Administered 2016-02-11: 650 mg via ORAL

## 2016-02-11 NOTE — Patient Instructions (Addendum)

## 2016-02-11 NOTE — Progress Notes (Signed)
pt requested to repeat rate of 43ml/kg/hr 160ml for 39cc, as she did this last time and did not have reaction. Max rate will be 45ml/kg/hr ( rate of  261ml/hr for 60 cc)  at next increase due to prior reactions.

## 2016-03-10 ENCOUNTER — Ambulatory Visit (HOSPITAL_BASED_OUTPATIENT_CLINIC_OR_DEPARTMENT_OTHER): Payer: 59

## 2016-03-10 VITALS — BP 134/78 | HR 56 | Temp 98.0°F | Resp 16

## 2016-03-10 DIAGNOSIS — D801 Nonfamilial hypogammaglobulinemia: Secondary | ICD-10-CM | POA: Diagnosis not present

## 2016-03-10 MED ORDER — METHYLPREDNISOLONE SODIUM SUCC 40 MG IJ SOLR
INTRAMUSCULAR | Status: AC
  Filled 2016-03-10: qty 1

## 2016-03-10 MED ORDER — ACETAMINOPHEN 325 MG PO TABS
650.0000 mg | ORAL_TABLET | Freq: Once | ORAL | Status: AC
  Administered 2016-03-10: 650 mg via ORAL

## 2016-03-10 MED ORDER — DIPHENHYDRAMINE HCL 25 MG PO TABS
50.0000 mg | ORAL_TABLET | Freq: Once | ORAL | Status: AC
  Administered 2016-03-10: 50 mg via ORAL
  Filled 2016-03-10: qty 2

## 2016-03-10 MED ORDER — DIPHENHYDRAMINE HCL 25 MG PO CAPS
ORAL_CAPSULE | ORAL | Status: AC
  Filled 2016-03-10: qty 2

## 2016-03-10 MED ORDER — SODIUM CHLORIDE 0.9 % IV SOLN
Freq: Once | INTRAVENOUS | Status: AC
  Administered 2016-03-10: 09:00:00 via INTRAVENOUS

## 2016-03-10 MED ORDER — IMMUNE GLOBULIN (HUMAN) 20 GM/200ML IV SOLN
80.0000 g | Freq: Once | INTRAVENOUS | Status: AC
  Administered 2016-03-10: 80 g via INTRAVENOUS
  Filled 2016-03-10: qty 800

## 2016-03-10 MED ORDER — ACETAMINOPHEN 325 MG PO TABS
ORAL_TABLET | ORAL | Status: AC
  Filled 2016-03-10: qty 2

## 2016-03-10 MED ORDER — METHYLPREDNISOLONE SODIUM SUCC 40 MG IJ SOLR
40.0000 mg | INTRAMUSCULAR | Status: DC
  Administered 2016-03-10: 40 mg via INTRAVENOUS

## 2016-03-10 NOTE — Patient Instructions (Signed)

## 2016-04-08 ENCOUNTER — Ambulatory Visit (HOSPITAL_BASED_OUTPATIENT_CLINIC_OR_DEPARTMENT_OTHER): Payer: 59

## 2016-04-08 VITALS — BP 115/64 | HR 72 | Temp 98.2°F | Resp 16 | Wt 150.8 lb

## 2016-04-08 DIAGNOSIS — D801 Nonfamilial hypogammaglobulinemia: Secondary | ICD-10-CM | POA: Diagnosis not present

## 2016-04-08 MED ORDER — METHYLPREDNISOLONE SODIUM SUCC 40 MG IJ SOLR
40.0000 mg | INTRAMUSCULAR | Status: DC
  Administered 2016-04-08: 40 mg via INTRAVENOUS

## 2016-04-08 MED ORDER — IMMUNE GLOBULIN (HUMAN) 20 GM/200ML IV SOLN
80.0000 g | Freq: Once | INTRAVENOUS | Status: AC
  Administered 2016-04-08: 80 g via INTRAVENOUS
  Filled 2016-04-08: qty 800

## 2016-04-08 MED ORDER — ACETAMINOPHEN 325 MG PO TABS
650.0000 mg | ORAL_TABLET | Freq: Once | ORAL | Status: AC
  Administered 2016-04-08: 650 mg via ORAL

## 2016-04-08 MED ORDER — METHYLPREDNISOLONE SODIUM SUCC 40 MG IJ SOLR
INTRAMUSCULAR | Status: AC
  Filled 2016-04-08: qty 1

## 2016-04-08 MED ORDER — ACETAMINOPHEN 325 MG PO TABS
ORAL_TABLET | ORAL | Status: AC
Start: 2016-04-08 — End: 2016-04-08
  Filled 2016-04-08: qty 2

## 2016-04-08 MED ORDER — DIPHENHYDRAMINE HCL 25 MG PO CAPS
ORAL_CAPSULE | ORAL | Status: AC
  Filled 2016-04-08: qty 2

## 2016-04-08 MED ORDER — DIPHENHYDRAMINE HCL 25 MG PO TABS
50.0000 mg | ORAL_TABLET | Freq: Once | ORAL | Status: AC
  Administered 2016-04-08: 50 mg via ORAL
  Filled 2016-04-08: qty 2

## 2016-04-08 NOTE — Progress Notes (Signed)
Pt requests IVIG Rate a little slower than protocol due to c/o side effects at home such as headaches and body aches she feels when IVIG rate given per protocol.    Requests 30 minute Intervals between dose increases instead of 15 minutes.  Requests to hold the rate at the "3rd bump up" which is 2 ml/kg/hr for 2 hours before increasing to the "4th or last bump up" which is 4 ml/kg/hr.   Pt tolerated well while in clinic today.  Total time for infusion today took about 5 1/2 hrs.

## 2016-04-08 NOTE — Patient Instructions (Signed)

## 2016-05-05 ENCOUNTER — Ambulatory Visit (HOSPITAL_BASED_OUTPATIENT_CLINIC_OR_DEPARTMENT_OTHER): Payer: 59

## 2016-05-05 VITALS — BP 106/64 | HR 64 | Temp 98.5°F | Resp 16 | Wt 146.2 lb

## 2016-05-05 DIAGNOSIS — D801 Nonfamilial hypogammaglobulinemia: Secondary | ICD-10-CM

## 2016-05-05 MED ORDER — METHYLPREDNISOLONE SODIUM SUCC 40 MG IJ SOLR
40.0000 mg | INTRAMUSCULAR | Status: DC
  Administered 2016-05-05: 40 mg via INTRAVENOUS

## 2016-05-05 MED ORDER — IMMUNE GLOBULIN (HUMAN) 10 GM/100ML IV SOLN
1.0500 g/kg | Freq: Once | INTRAVENOUS | Status: AC
  Administered 2016-05-05: 70 g via INTRAVENOUS
  Filled 2016-05-05: qty 600

## 2016-05-05 MED ORDER — DIPHENHYDRAMINE HCL 25 MG PO TABS
50.0000 mg | ORAL_TABLET | Freq: Once | ORAL | Status: AC
  Administered 2016-05-05: 50 mg via ORAL
  Filled 2016-05-05: qty 2

## 2016-05-05 MED ORDER — DIPHENHYDRAMINE HCL 25 MG PO CAPS
ORAL_CAPSULE | ORAL | Status: AC
  Filled 2016-05-05: qty 2

## 2016-05-05 MED ORDER — ACETAMINOPHEN 325 MG PO TABS
ORAL_TABLET | ORAL | Status: AC
  Filled 2016-05-05: qty 2

## 2016-05-05 MED ORDER — ACETAMINOPHEN 325 MG PO TABS
650.0000 mg | ORAL_TABLET | Freq: Once | ORAL | Status: AC
  Administered 2016-05-05: 650 mg via ORAL

## 2016-05-05 MED ORDER — METHYLPREDNISOLONE SODIUM SUCC 40 MG IJ SOLR
INTRAMUSCULAR | Status: AC
Start: 2016-05-05 — End: 2016-05-05
  Filled 2016-05-05: qty 1

## 2016-05-05 NOTE — Patient Instructions (Signed)

## 2016-05-07 ENCOUNTER — Encounter: Payer: Self-pay | Admitting: Hematology and Oncology

## 2016-05-13 ENCOUNTER — Ambulatory Visit (INDEPENDENT_AMBULATORY_CARE_PROVIDER_SITE_OTHER): Payer: 59 | Admitting: Internal Medicine

## 2016-05-13 ENCOUNTER — Encounter: Payer: Self-pay | Admitting: Internal Medicine

## 2016-05-13 DIAGNOSIS — J209 Acute bronchitis, unspecified: Secondary | ICD-10-CM

## 2016-05-13 NOTE — Assessment & Plan Note (Addendum)
She credits IVIG from her oncologist with markedly reducing incidence of respiratory infections and is concerned what will happen when she no longer has insurance coverage for this. She has also felt that being able to call for an antibiotic on short notice when infection starts has been an important strategy for reducing the severity of infections.

## 2016-05-13 NOTE — Patient Instructions (Signed)
In addition to an antihistamine like allegra, you could consider otc allergy nasal spray Nasalcrom/ Cromol/ cromolyn, which is not a steroid.  We will be happy to see you again here as needed. You can get Dr Loren Racer support as well.

## 2016-05-13 NOTE — Progress Notes (Signed)
Subjective:    Patient ID: Bianca Parker, female    DOB: Jan 05, 1946, 70 y.o.   MRN: [id number]  HPI   06/18/15- 69 yoF followed for Allergic rhinitis, chronic rhinosinusitis. Hx chemoRx for CLL in remission, acquired hypogammaglobulinemia/ IVIG On IVIG. Dr Gearldine Bienenstock doxy for sinus infection in September and asks about plans for dealing with acute infections.  Up to date on flu and PVAX. FOLLOWS FOR: still has a cough from a sinus infection 5 weeks treated by Oncology.  cough is dry at times with spasms with occasional vomiting, sometimes clear white mucus prod in the AM.  Oncology is wondering about a referral to ID to manage infections  11/02/2015-70 year old female followed for allergic rhinitis, chronic rhinosinusitis. Complicated by history chemotherapy for CLL in remission, acquired hypogammaglobulinemia/IVIG FOLLOWS FOR: pt doing well today- no acute breathing complaints.  Wants to discuss her IV IG treatment.  So far no major infections this winter. She continues IVIG/ Dr Alvy Bimler. History of penicillin allergy but okay with cephalosporins. Mild postnasal drip with no more chronic cough.  CXR 06/18/2015-NAD with incidental DGD mid thoracic spine  05/13/2016-70 year old female never smoker followed for allergic rhinitis, chronic rhinosinusitis, complicated by history chemotherapy for CLL in remission, acquired hypogammaglobulinemia/IVIG FOLLOWS FOR: doing well ov the IVIG, does mention some allergy symptoms with itchy runny nose. Dieting-lost 40 pounds.  IVIG has helped her avoid respiratory infections so she feels better, eating better, exercising more. Had flu vaccine last week. CLL is still in remission Some recent itching of nose, watery rhinorrhea. Allegra helps. She wants to avoid steroids. Denies chest discomfort, cough or wheeze.  Review of Systems- see HPI Constitutional:   No-   weight loss, night sweats, fevers, chills, fatigue, lassitude. HEENT:   No-  headaches,  difficulty swallowing, tooth/dental problems, sore throat,       No-  sneezing, itching, +ear ache, +nasal congestion, +post nasal drip,  CV:  No-   chest pain, orthopnea, PND, swelling in lower extremities, anasarca, dizziness, palpitations Resp: No-   shortness of breath with exertion or at rest.              No-  productive cough,   non-productive cough,  No- coughing up of blood.              No-change in color of mucus.  No- wheezing.   Skin: No-   rash or lesions. GI:  No-   heartburn, indigestion, abdominal pain, nausea, vomiting,  GU: . MS:  No-   joint pain or swelling. Neuro-     nothing unusual Psych:  No- change in mood or affect. No depression or anxiety.  No memory loss.  Objective:   Physical Exam General- Alert, Oriented, Affect-appropriate, Distress- none acute; well appearing,                 medium Build. Looks well.  Skin- rash-none, lesions- none, excoriation- none Lymphadenopathy- none Head- atraumatic            Eyes- Gross vision intact, PERRLA, conjunctivae clear secretions            Ears- Hearing, canals-normal. Sclerosis of TMs-no inflammation or fluid            Nose- +turbinate edema, no-Septal dev, mucus, polyps, erosion, perforation             Throat- Mallampati II , mucosa + red without exudate , drainage- none, tonsils- atrophic Neck- flexible , trachea midline, no stridor , thyroid  nl, carotid no bruit Chest - symmetrical excursion , unlabored           Heart/CV- RRR , no murmur , no gallop  , no rub, nl s1 s2                           - JVD- none , edema- none, stasis changes- none, varices- none           Lung- clear to P&A, wheeze- none, cough-none , dullness-none, rub- none           Chest wall-  Abd-  Br/ Gen/ Rectal- Not done, not indicated Extrem- cyanosis- none, clubbing, none, atrophy- none, strength- nl Neuro- grossly intact to observation

## 2016-05-14 ENCOUNTER — Other Ambulatory Visit: Payer: Self-pay | Admitting: Obstetrics and Gynecology

## 2016-05-14 DIAGNOSIS — Z1231 Encounter for screening mammogram for malignant neoplasm of breast: Secondary | ICD-10-CM

## 2016-05-19 LAB — IFOBT (OCCULT BLOOD): IMMUNOLOGICAL FECAL OCCULT BLOOD TEST: NEGATIVE

## 2016-06-02 ENCOUNTER — Encounter: Payer: Self-pay | Admitting: Internal Medicine

## 2016-06-02 ENCOUNTER — Ambulatory Visit (HOSPITAL_BASED_OUTPATIENT_CLINIC_OR_DEPARTMENT_OTHER): Payer: 59

## 2016-06-02 ENCOUNTER — Encounter: Payer: Self-pay | Admitting: Hematology and Oncology

## 2016-06-02 ENCOUNTER — Ambulatory Visit (HOSPITAL_BASED_OUTPATIENT_CLINIC_OR_DEPARTMENT_OTHER): Payer: 59 | Admitting: Hematology and Oncology

## 2016-06-02 ENCOUNTER — Other Ambulatory Visit (HOSPITAL_BASED_OUTPATIENT_CLINIC_OR_DEPARTMENT_OTHER): Payer: 59

## 2016-06-02 VITALS — BP 136/71 | HR 103 | Temp 97.9°F | Resp 18 | Wt 142.5 lb

## 2016-06-02 VITALS — BP 111/69 | HR 63 | Temp 98.8°F | Resp 18

## 2016-06-02 DIAGNOSIS — C9111 Chronic lymphocytic leukemia of B-cell type in remission: Secondary | ICD-10-CM | POA: Diagnosis not present

## 2016-06-02 DIAGNOSIS — J329 Chronic sinusitis, unspecified: Secondary | ICD-10-CM | POA: Diagnosis not present

## 2016-06-02 DIAGNOSIS — D801 Nonfamilial hypogammaglobulinemia: Secondary | ICD-10-CM

## 2016-06-02 DIAGNOSIS — J328 Other chronic sinusitis: Secondary | ICD-10-CM

## 2016-06-02 DIAGNOSIS — D696 Thrombocytopenia, unspecified: Secondary | ICD-10-CM | POA: Diagnosis not present

## 2016-06-02 LAB — CBC WITH DIFFERENTIAL/PLATELET
BASO%: 0.2 % (ref 0.0–2.0)
Basophils Absolute: 0 10*3/uL (ref 0.0–0.1)
EOS ABS: 0 10*3/uL (ref 0.0–0.5)
EOS%: 0.9 % (ref 0.0–7.0)
HCT: 44.5 % (ref 34.8–46.6)
HEMOGLOBIN: 14.7 g/dL (ref 11.6–15.9)
LYMPH%: 45.3 % (ref 14.0–49.7)
MCH: 30.1 pg (ref 25.1–34.0)
MCHC: 33.1 g/dL (ref 31.5–36.0)
MCV: 90.8 fL (ref 79.5–101.0)
MONO#: 0.3 10*3/uL (ref 0.1–0.9)
MONO%: 7.4 % (ref 0.0–14.0)
NEUT%: 46.2 % (ref 38.4–76.8)
NEUTROS ABS: 2 10*3/uL (ref 1.5–6.5)
PLATELETS: 124 10*3/uL — AB (ref 145–400)
RBC: 4.9 10*6/uL (ref 3.70–5.45)
RDW: 14.1 % (ref 11.2–14.5)
WBC: 4.3 10*3/uL (ref 3.9–10.3)
lymph#: 2 10*3/uL (ref 0.9–3.3)

## 2016-06-02 MED ORDER — IMMUNE GLOBULIN (HUMAN) 10 GM/100ML IV SOLN
1.0000 g/kg | Freq: Once | INTRAVENOUS | Status: AC
  Administered 2016-06-02: 65 g via INTRAVENOUS
  Filled 2016-06-02: qty 200

## 2016-06-02 MED ORDER — DEXTROSE 5 % IV SOLN
INTRAVENOUS | Status: DC
  Administered 2016-06-02: 10:00:00 via INTRAVENOUS

## 2016-06-02 MED ORDER — METHYLPREDNISOLONE SODIUM SUCC 40 MG IJ SOLR
40.0000 mg | INTRAMUSCULAR | Status: DC
  Administered 2016-06-02: 40 mg via INTRAVENOUS

## 2016-06-02 MED ORDER — DOXYCYCLINE HYCLATE 100 MG PO TABS: 100.0000 mg | ORAL_TABLET | Freq: Two times a day (BID) | ORAL | 0 refills | Status: DC

## 2016-06-02 MED ORDER — DIPHENHYDRAMINE HCL 25 MG PO TABS
50.0000 mg | ORAL_TABLET | Freq: Once | ORAL | Status: AC
  Administered 2016-06-02: 50 mg via ORAL
  Filled 2016-06-02: qty 2

## 2016-06-02 MED ORDER — ACETAMINOPHEN 325 MG PO TABS
650.0000 mg | ORAL_TABLET | Freq: Once | ORAL | Status: AC
  Administered 2016-06-02: 650 mg via ORAL

## 2016-06-02 MED ORDER — METHYLPREDNISOLONE SODIUM SUCC 40 MG IJ SOLR
INTRAMUSCULAR | Status: AC
  Filled 2016-06-02: qty 1

## 2016-06-02 MED ORDER — DIPHENHYDRAMINE HCL 25 MG PO CAPS
ORAL_CAPSULE | ORAL | Status: AC
  Filled 2016-06-02: qty 2

## 2016-06-02 MED ORDER — OSELTAMIVIR PHOSPHATE 75 MG PO CAPS: 75.0000 mg | ORAL_CAPSULE | Freq: Two times a day (BID) | ORAL | 0 refills | Status: DC

## 2016-06-02 MED ORDER — SODIUM CHLORIDE 0.9 % IV SOLN: INTRAVENOUS | Status: DC

## 2016-06-02 MED ORDER — ACETAMINOPHEN 325 MG PO TABS
ORAL_TABLET | ORAL | Status: AC
  Filled 2016-06-02: qty 2

## 2016-06-02 NOTE — Progress Notes (Signed)
[place] [facility] OFFICE PROGRESS NOTE  Patient Care Team: Haywood Pao, MD as PCP - General (Internal Medicine) Deneise Lever, MD as Referring Physician (Pulmonary Disease) Rozetta Nunnery, MD as Referring Physician (Otolaryngology) Milus Banister, MD (Gastroenterology) Arvella Nigh, MD (Obstetrics and Gynecology) Heath Lark, MD as Consulting Physician (Hematology and Oncology) Jari Pigg, MD as Consulting Physician (Dermatology) Marygrace Drought, MD as Consulting Physician (Ophthalmology)  SUMMARY OF ONCOLOGIC HISTORY:  She was in one of the first cohorts of patients treated with FCR on protocol at the [facility] at time of her initial diagnosis in July 2002. Technically she had RAI  stage III disease due to anemia but she had no organomegaly or lymphadenopathy on exam. FCR was started in 12/2001, and continued through 06/2002. She achieved a complete hematologic response and,which has been durable now for over 11 years!  She did develop hypogammaglobulinemia. As a result, she gets recurrent bronchitis and sinusitis. In April 2013 she was started her on monthly intravenous immunoglobulin 400 mg per kilogram and this has significantly impacted on the frequency and severity of her infections. This was continued through April of 2014. She has had no new infections since stopping the IVIG but as expected, immunoglobulin levels have fallen to pretreatment levels.   She has a history of her recurrent squamous cell carcinomas of her skin and has had 2 lesions excised from her left tibial area and another 1 excised from the vulvar area. Recently, she had a basal cell carcinoma removed. She has a history of both herpes zoster and herpes simplex infections. Recently, she had abnormal Pap smear and currently on close observation. Recent colposcopy was negative.  She received IVIG treatment for acquired hypogammaglobulinemia but stopped in April 2014 due to sick serum  syndrome In July 2016, IVIG is resumed with premedication In August 2016, she developed cellulitis from venipuncture, resolved with antibiotics  INTE tRVAL HISTORY: Please see below for problem oriented charting. She feels well. She has been eating healthy with regular exercise and she has lost some weight No new lymphadenopathy. Denies recurrent infection. She complained of cold-like symptoms with congestion but no recent fevers, cough or chills. The patient have serum sickness with rapid infusion of IVIG. Whenever her infusion rate is slowed down, she denies problems. She received aggressive premedication before each treatment  REVIEW OF SYSTEMS:   Constitutional: Denies fevers, chills or abnormal weight loss Eyes: Denies blurriness of vision Ears, nose, mouth, throat, and face: Denies mucositis or sore throat Cardiovascular: Denies palpitation, chest discomfort or lower extremity swelling Gastrointestinal:  Denies nausea, heartburn or change in bowel habits Skin: Denies abnormal skin rashes Lymphatics: Denies new lymphadenopathy or easy bruising Neurological:Denies numbness, tingling or new weaknesses Behavioral/Psych: Mood is stable, no new changes  All other systems were reviewed with the patient and are negative.  I have reviewed the past medical history, past surgical history, social history and family history with the patient and they are unchanged from previous note.  ALLERGIES:  is allergic to penicillins; pentamidine; and sulfonamide derivatives.  MEDICATIONS:  Current Outpatient Prescriptions  Medication Sig Dispense Refill  . Calcium Carbonate-Vitamin D (CALTRATE 600+D) 600-400 MG-UNIT per tablet Take 1 tablet by mouth daily.    . fexofenadine (ALLEGRA) 180 MG tablet Take 180 mg by mouth daily.      Marland Kitchen guaiFENesin (MUCINEX) 600 MG 12 hr tablet Take by mouth daily as needed. Reported on 08/24/2015    . levothyroxine (SYNTHROID, LEVOTHROID) 50 MCG  tablet Take 50 mcg by  mouth daily before breakfast.     . Multiple Vitamin (MULTIVITAMIN) tablet Take 1 tablet by mouth daily.    . mupirocin ointment (BACTROBAN) 2 % Apply 1 application topically as needed.     . valACYclovir (VALTREX) 500 MG tablet Take 500 mg by mouth as needed.    . doxycycline (VIBRA-TABS) 100 MG tablet Take 1 tablet (100 mg total) by mouth 2 (two) times daily. 14 tablet 0  . oseltamivir (TAMIFLU) 75 MG capsule Take 1 capsule (75 mg total) by mouth 2 (two) times daily. 10 capsule 0   No current facility-administered medications for this visit.     PHYSICAL EXAMINATION: ECOG PERFORMANCE STATUS: 1 - Symptomatic but completely ambulatory  Vitals:   06/02/16 0833  BP: 136/71  Pulse: (!) 103  Resp: 18  Temp: 97.9 F (36.6 C)   Filed Weights   06/02/16 0833  Weight: 142 lb 8 oz (64.6 kg)    GENERAL:alert, no distress and comfortable SKIN: skin color, texture, turgor are normal, no rashes or significant lesions EYES: normal, Conjunctiva are pink and non-injected, sclera clear OROPHARYNX:no exudate, no erythema and lips, buccal mucosa, and tongue normal  NECK: supple, thyroid normal size, non-tender, without nodularity LYMPH:  no palpable lymphadenopathy in the cervical, axillary or inguinal LUNGS: clear to auscultation and percussion with normal breathing effort HEART: regular rate & rhythm and no murmurs and no lower extremity edema ABDOMEN:abdomen soft, non-tender and normal bowel sounds Musculoskeletal:no cyanosis of digits and no clubbing  NEURO: alert & oriented x 3 with fluent speech, no focal motor/sensory deficits  LABORATORY DATA:  I have reviewed the data as listed    Component Value Date/Time   NA 140 06/04/2015 0829   K 3.8 06/04/2015 0829   CL 104 05/22/2014 0737   CL 109 (H) 01/20/2013 0801   CO2 21 (L) 06/04/2015 0829   GLUCOSE 120 06/04/2015 0829   GLUCOSE 89 01/20/2013 0801   BUN 18.8 06/04/2015 0829   CREATININE 1.1 06/04/2015 0829   CALCIUM 9.4  06/04/2015 0829   PROT 7.1 06/04/2015 0829   ALBUMIN 3.7 06/04/2015 0829   AST 34 06/04/2015 0829   ALT 33 06/04/2015 0829   ALKPHOS 51 06/04/2015 0829   BILITOT 0.42 06/04/2015 0829   GFRNONAA 57 (L) 04/18/2009 0943   GFRAA  04/18/2009 0943    >60        The eGFR has been calculated using the MDRD equation. This calculation has not been validated in all clinical situations. eGFR's persistently <60 mL/min signify possible Chronic Kidney Disease.    No results found for: SPEP, UPEP  Lab Results  Component Value Date   WBC 4.3 06/02/2016   NEUTROABS 2.0 06/02/2016   HGB 14.7 06/02/2016   HCT 44.5 06/02/2016   MCV 90.8 06/02/2016   PLT 124 (L) 06/02/2016      Chemistry      Component Value Date/Time   NA 140 06/04/2015 0829   K 3.8 06/04/2015 0829   CL 104 05/22/2014 0737   CL 109 (H) 01/20/2013 0801   CO2 21 (L) 06/04/2015 0829   BUN 18.8 06/04/2015 0829   CREATININE 1.1 06/04/2015 0829   GLU 92 06/17/2013      Component Value Date/Time   CALCIUM 9.4 06/04/2015 0829   ALKPHOS 51 06/04/2015 0829   AST 34 06/04/2015 0829   ALT 33 06/04/2015 0829   BILITOT 0.42 06/04/2015 0829     ASSESSMENT & PLAN:  Chronic lymphoid leukemia in remission Clinically, she remained in remission. Continues annual follow-up with history, physical examination and blood work.   Thrombocytopenia ([place]) The cause is unknown, could be due to consumption or treatment related It is mild and there is little change compared from previous platelet count.  She is asymptomatic from the thrombocytopenia. I will observe for now.   Hypogammaglobulinemia, acquired This is related to long-term side effects from prior chemotherapy. She has recurrent infection with herpesvirus, bronchitis and skin infections She is already on chronic treatment with Valtrex daily. She is doing well and will continue IVIG treatment at 1 g/kg every [redacted] weeks along with premedication with Benadryl, Tylenol and  Solu-Medrol. The risk, benefit, side effects of IVIG is fully discussed with the patient and she agreed to proceed. Since IVIG treatment, she denies recurrence of infection. We will continue the same. She has minor serum sickness with Octagam and Privigen. According to the patient, she does better with slower infusion. We will budget 8 hours infusion for her each time  RHINOSINUSITIS, CHRONIC She have chronic rhinosinusitis and recurrent infection. I recommend close follow-up with the pulmonologist She has recent cold like symptoms with nasal drainage and congestion but no fever or cough The patient requested prescription of doxycycline and Tamiflu in hand because based on her prior experience, she had a lot of upper respiratory tract infection in the winter. We discussed the importance of close communication and the patient not to take or start antibiotics for minor symptoms. She agreed.   Orders Placed This Encounter  Procedures  . IgG, IgA, IgM    Standing Status:   Future    Standing Expiration Date:   07/07/2017   All questions were answered. The patient knows to call the clinic with any problems, questions or concerns. No barriers to learning was detected. I spent 25 minutes counseling the patient face to face. The total time spent in the appointment was 30 minutes and more than 50% was on counseling and review of test results     Heath Lark, MD 06/02/2016 9:55 AM

## 2016-06-02 NOTE — Assessment & Plan Note (Signed)
This is related to long-term side effects from prior chemotherapy. She has recurrent infection with herpesvirus, bronchitis and skin infections She is already on chronic treatment with Valtrex daily. She is doing well and will continue IVIG treatment at 1 g/kg every [redacted] weeks along with premedication with Benadryl, Tylenol and Solu-Medrol. The risk, benefit, side effects of IVIG is fully discussed with the patient and she agreed to proceed. Since IVIG treatment, she denies recurrence of infection. We will continue the same. She has minor serum sickness with Octagam and Privigen. According to the patient, she does better with slower infusion. We will budget 8 hours infusion for her each time

## 2016-06-02 NOTE — Assessment & Plan Note (Signed)
She have chronic rhinosinusitis and recurrent infection. I recommend close follow-up with the pulmonologist She has recent cold like symptoms with nasal drainage and congestion but no fever or cough The patient requested prescription of doxycycline and Tamiflu in hand because based on her prior experience, she had a lot of upper respiratory tract infection in the winter. We discussed the importance of close communication and the patient not to take or start antibiotics for minor symptoms. She agreed.

## 2016-06-02 NOTE — Assessment & Plan Note (Signed)
Clinically, she remained in remission. Continues annual follow-up with history, physical examination and blood work.  

## 2016-06-02 NOTE — Assessment & Plan Note (Signed)
The cause is unknown, could be due to consumption or treatment related It is mild and there is little change compared from previous platelet count.  She is asymptomatic from the thrombocytopenia. I will observe for now.  

## 2016-06-02 NOTE — Patient Instructions (Signed)

## 2016-06-03 LAB — IGG, IGA, IGM

## 2016-06-23 ENCOUNTER — Ambulatory Visit
Admission: RE | Admit: 2016-06-23 | Discharge: 2016-06-23 | Disposition: A | Discharge status: Home / Self Care | Payer: 59 | Source: Ambulatory Visit | Attending: Obstetrics and Gynecology | Admitting: Obstetrics and Gynecology

## 2016-06-23 DIAGNOSIS — Z1231 Encounter for screening mammogram for malignant neoplasm of breast: Secondary | ICD-10-CM

## 2016-06-30 ENCOUNTER — Ambulatory Visit (HOSPITAL_BASED_OUTPATIENT_CLINIC_OR_DEPARTMENT_OTHER): Payer: 59

## 2016-06-30 VITALS — BP 110/49 | HR 62 | Temp 98.0°F | Resp 16

## 2016-06-30 DIAGNOSIS — D801 Nonfamilial hypogammaglobulinemia: Secondary | ICD-10-CM | POA: Diagnosis not present

## 2016-06-30 MED ORDER — IMMUNE GLOBULIN (HUMAN) 10 GM/100ML IV SOLN
65.0000 g | Freq: Once | INTRAVENOUS | Status: AC
  Administered 2016-06-30: 65 g via INTRAVENOUS
  Filled 2016-06-30: qty 400

## 2016-06-30 MED ORDER — DIPHENHYDRAMINE HCL 25 MG PO CAPS
ORAL_CAPSULE | ORAL | Status: AC
  Filled 2016-06-30: qty 2

## 2016-06-30 MED ORDER — METHYLPREDNISOLONE SODIUM SUCC 40 MG IJ SOLR
INTRAMUSCULAR | Status: AC
  Filled 2016-06-30: qty 1

## 2016-06-30 MED ORDER — ACETAMINOPHEN 325 MG PO TABS
ORAL_TABLET | ORAL | Status: AC
  Filled 2016-06-30: qty 2

## 2016-06-30 MED ORDER — METHYLPREDNISOLONE SODIUM SUCC 40 MG IJ SOLR
40.0000 mg | INTRAMUSCULAR | Status: DC
  Administered 2016-06-30: 40 mg via INTRAVENOUS

## 2016-06-30 MED ORDER — DIPHENHYDRAMINE HCL 25 MG PO TABS
50.0000 mg | ORAL_TABLET | Freq: Once | ORAL | Status: AC
  Administered 2016-06-30: 50 mg via ORAL
  Filled 2016-06-30: qty 2

## 2016-06-30 MED ORDER — ACETAMINOPHEN 325 MG PO TABS
650.0000 mg | ORAL_TABLET | Freq: Once | ORAL | Status: AC
  Administered 2016-06-30: 650 mg via ORAL

## 2016-06-30 NOTE — Patient Instructions (Signed)

## 2016-07-07 ENCOUNTER — Telehealth: Payer: Self-pay | Admitting: Cardiology

## 2016-07-07 NOTE — Telephone Encounter (Signed)
Records received from [facility] on 07/23/16 @ 2:00 with Dr Ellyn Hack. Records given to [employer] (medical records) CN

## 2016-07-23 ENCOUNTER — Ambulatory Visit (INDEPENDENT_AMBULATORY_CARE_PROVIDER_SITE_OTHER): Payer: 59 | Admitting: Cardiology

## 2016-07-23 ENCOUNTER — Encounter: Payer: Self-pay | Admitting: Cardiology

## 2016-07-23 VITALS — BP 139/71 | HR 64 | Ht 64.0 in | Wt 146.8 lb

## 2016-07-23 DIAGNOSIS — C9111 Chronic lymphocytic leukemia of B-cell type in remission: Secondary | ICD-10-CM | POA: Diagnosis not present

## 2016-07-23 DIAGNOSIS — R55 Syncope and collapse: Secondary | ICD-10-CM

## 2016-07-23 DIAGNOSIS — R000 Tachycardia, unspecified: Secondary | ICD-10-CM

## 2016-07-23 DIAGNOSIS — E785 Hyperlipidemia, unspecified: Secondary | ICD-10-CM | POA: Diagnosis not present

## 2016-07-23 DIAGNOSIS — D508 Other iron deficiency anemias: Secondary | ICD-10-CM | POA: Diagnosis not present

## 2016-07-23 DIAGNOSIS — E784 Other hyperlipidemia: Secondary | ICD-10-CM

## 2016-07-23 DIAGNOSIS — E7849 Other hyperlipidemia: Secondary | ICD-10-CM

## 2016-07-23 NOTE — Progress Notes (Signed)
PCP: Haywood Pao, MD  Clinic Note: Chief Complaint  Patient presents with  . New Patient (Initial Visit)    rapid heart beat  . Shortness of Breath    when going uphill.     HPI: Bianca Parker is a 70 y.o. female with a PMH below who presents today for Evaluation of some very interesting rapid heart rate episode that she has been experiencing. Her major health event is having a difficult to manage (CLL as well as hypergammaglobulinemia.). She is a very active woman who enjoys hiking and walking exercises. She usually is eating healthy, and is very health/weight conscious. She used to have frequent infections as a result of her CLL, but has been on TIG infusions monthly him that it really seem to help.  Bianca Parker was referred by her gynecologist (Dr. Radene Knee) following her November visit.  Recent Hospitalizations: None  Studies Reviewed: None  Interval History: She presents today to discuss symptom that she's been having off and on for many years that is basically a tendency for her heart rate to increase beyond what would be expected when she is doing significant exertion such as walking up a hill. With routine walking flat orders written exercise she does not necessarily notice any significant changes, however when she goes uphill, her heart rate goes up even faster than she usually would.  She described an incident that occurred on the week of October 16. Wall hiking in the mountains with her husband on a very long in contiguous up low-grade carrying a backpack and wearing hiking boots, she usually is able to keep up with her husband however wall climbing to the summit of one of the peaks, she became quite tired on and felt her heart racing significantly. She decided she did not want the new on the summer. He didn't taking arrest while her husband climbed up the back she was still extremely fatigued at times and return. The way back had some uphill and down inclines and  every sometimes when until her heart rate went very very fast making her feel lightheaded and nauseous. She has stopped standstill hold onto something and then finally had to sit down and put her head between her knees to fight off the nausea and lightheadedness. When this resolved without fibrous later, she continued walking but her husband took her backpack. Still again she noted having beginning of lightheadedness if they ever started up an incline. This was in the setting of her having just recovered from a cold on and she is not sure if she had taken a dose of Sudafed this is not something she usually does but she was trying to prevent having issues with sinus congestion and ear issues and at altitude. She also was carrying heavy pack and was wearing heavy boots. Once she stopped whining and got down to flat ground she felt fine. Later on that week she low shoulder hikes [name] she did fine. At that time she was not carrying a pack and was going slowly up hills. Since then she is also done checks and hikes in the mountains and has done fine.  She is always now to exercise and has been doing up to 20 minute walks and then built up to 45 minute walks. He usually uses a heart monitor and beginning to make sure she didn't overdo it. Started doing interval walking and added some weight training and yoga. She says she has lost 60-70 pounds over  last year or so.  He indicates that when her heart rate goes fast she feels a discomfort in her chest, but she really denies it most the time which is walking. The discomfort in her chest is only associated with palpitations and not otherwise noted. She has no PND orthopnea or edema or any real significant symptoms at rest. At rest also she denies any rapid irregular heartbeats palpitations. She may feel some irregular beats but nothing fast.  No PND, orthopnea or edema. No syncope/near syncope (although she did feel quite dizzy on that one episode  described above). No TIA/amaurosis fugax symptoms. No melena, hematochezia, hematuria, or epstaxis. No claudication.  ROS: A comprehensive was performed. Review of Systems  Constitutional: Negative for fever and malaise/fatigue.  HENT: Negative for congestion, hearing loss and nosebleeds.   Eyes: Negative.   Respiratory: Negative for cough, shortness of breath (With exertion when her heart rate goes very fast) and wheezing.   Cardiovascular:       Per history of present illness  Gastrointestinal: Positive for constipation. Negative for blood in stool and melena.  Genitourinary: Negative for hematuria.  Musculoskeletal: Negative for back pain, falls, joint pain and myalgias.  Neurological: Positive for dizziness (Per history of present illness). Negative for tingling, tremors, focal weakness and seizures.  Endo/Heme/Allergies: Bruises/bleeds easily.  Psychiatric/Behavioral: Negative for hallucinations and memory loss.    Past Medical History:  Diagnosis Date  . Allergic rhinitis, cause unspecified   . Anemia   . CARCINOMA, SKIN, SQUAMOUS CELL   . Chronic lymphoid leukemia in remission [facility]) 2002 dx, tx 2003   Diagnosed in July 2002. Started on clinical trial at [facility] in June 2003. Now she is reportedly in remission but has a very compromised immune system. She is intermittently been doing IVIG and then was restarted back in July 2016. Now she doesn't have a month.   . Dyslipidemia   . Hepatitis A antibody positive 04/06/2012   Acute diarrheal illness & elevated liver function tests 02/18/12;   . Hyperlipidemia   . Hypogammaglobulinemia, acquired ([place]) 11/18/2011   Due to CLL and prior chemo  . Rectal bleeding 07/24/2015   Relevant past medical history: When she was younger she noticed that her heart rate would usually go up too quickly when she would job uphill. Currently she did have it checked out in the past and was told that she had some type of issue between the timing of  her chambers was not involved with mitral valve prolapse or atrial fibrillation. She was told it was benign and didn't want.   Past Surgical History:  Procedure Laterality Date  . ABDOMINAL HYSTERECTOMY  1966  . BUNIONECTOMY    . SIGMOIDOSCOPY    . SKIN BIOPSY    . TONSILLECTOMY     Current Meds  Medication Sig  . Calcium Carbonate-Vitamin D (CALTRATE 600+D) 600-400 MG-UNIT per tablet Take 1 tablet by mouth daily.  Marland Kitchen doxycycline (VIBRA-TABS) 100 MG tablet Take 1 tablet (100 mg total) by mouth 2 (two) times daily.  . fexofenadine (ALLEGRA) 180 MG tablet Take 180 mg by mouth daily.    Marland Kitchen guaiFENesin (MUCINEX) 600 MG 12 hr tablet Take by mouth daily as needed. Reported on 08/24/2015  . levothyroxine (SYNTHROID, LEVOTHROID) 50 MCG tablet Take 50 mcg by mouth daily before breakfast.   . Multiple Vitamin (MULTIVITAMIN) tablet Take 1 tablet by mouth daily.  . mupirocin ointment (BACTROBAN) 2 % Apply 1 application topically as needed.   Marland Kitchen oseltamivir (  TAMIFLU) 75 MG capsule Take 1 capsule (75 mg total) by mouth 2 (two) times daily.  . valACYclovir (VALTREX) 500 MG tablet Take 500 mg by mouth as needed.   Allergies  Allergen Reactions  . Penicillins   . Pentamidine   . Sulfonamide Derivatives     Social History   Social History  . Marital status: Married    Spouse name: N/A  . Number of children: N/A  . Years of education: N/A   Social History Main Topics  . Smoking status: Never Smoker  . Smokeless tobacco: Never Used     Comment: Married, lives with husband Gershon Crane), grown children. 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 Alcohol use Yes     Comment: Rare  . Drug use: No  . Sexual activity: Yes   Other Topics Concern  . None   Social History Narrative  . None   Family History  Problem Relation Age of Onset  . Colon cancer Mother   . Cancer Mother     colon  . Cancer Maternal Grandmother     ovarian  . Cancer Paternal Grandmother     breast  . Breast cancer  Other   . Ovarian cancer Other      Wt Readings from Last 3 Encounters:  07/23/16 66.6 kg (146 lb 12.8 oz)  06/02/16 64.6 kg (142 lb 8 oz)  05/13/16 66.1 kg (145 lb 12.8 oz)    PHYSICAL EXAM BP 139/71   Pulse 64   Ht 5\' 4"  (1.626 m)   Wt 66.6 kg (146 lb 12.8 oz)   BMI 25.20 kg/m   General appearance: alert, cooperative, appears stated age, no distress and Well-nourished, well-groomed. Healthy-appearing. Normal mood and affect. HEENT: [place]/AT, EOMI, MMM, anicteric sclera Neck: no adenopathy, no carotid bruit and no JVD Lungs: clear to auscultation bilaterally, normal percussion bilaterally and non-labored Heart: regular rate and rhythm, S1 and S2 normal, no murmur, click, rub or gallop; nondisplaced PMI Abdomen: soft, non-tender; bowel sounds normal; no masses,  no organomegaly; no HJR Extremities: extremities normal, atraumatic, no cyanosis, or edema  Pulses: 2+ and symmetric;  Skin: normal, no edema, no evidence of bleeding or bruising and no lesions noted  Neurologic: Mental status: Alert, oriented, thought content appropriate Cranial nerves: normal (II-XII grossly intact)    Adult ECG Report Sinus rhythm with first-degree AV block. Septal infarct, age undetermined. Relatively stable EKG.  Other studies Reviewed: Additional studies/ records that were reviewed today include:  Recent Labs:  05/22/2016  Sodium 141, potassium 4.9, chloride 105, bicarbonate 28, BUN 18, creatinine 0.9, glucose 80, calcium 9.7; total protein 8.0, Augmentin 3.8; AST/ALT 50/42 (borderline elevated, alkaline phosphatase 49, total bilirubin 0.6.  CBC: W4.0, H/H 15/45.9, platelets 142.;; TSH 1.62, free T4 1 0.2, vitamin D 4.5  TC 208, TG 88, HDL 57, LDL 133  ASSESSMENT / PLAN: Problem List Items Addressed This Visit    Chronic lymphoid leukemia in remission ([place])   Dyslipidemia   Exercise-induced tachycardia - Primary    This is an exercise-induced tachycardia is limited really to the response  to her going hill. She is usually relatively well controlled as far as being in pretty decent shape, but the episode described in the history of present illness was pretty significant spell. Is difficult to tell what this would be. Would like to exclude ischemia and would also want to evaluate to see if we can reproduce this event.  Plan: To begin evaluation we will start off with  GXT (graded excess treadmill test) on this will allow [place] to have her walk rapidly in the incline to see if her symptoms can be mimicked.  I think she is a tendency to exercise related tachycardia, and this recent episode could've been triggered by her recovering from a cold, possibly she took Sudafed, it could also be that she was carrying a pack walking longer and harder than she usually does. Dorsalis ELIQUIS truly an anginal type equivalent. She is more of potential arrhythmia type of episode.      Relevant Orders   EKG 12-Lead (Completed)   EXERCISE TOLERANCE TEST   Pre-syncope    Only notice it that one time with severe tachycardia following uphill exertion. Otherwise she has noted her heart rate going fast, but has not gone the point of feeling like she may pass out. That was the worst episode.  We'll need to evaluate why she is having the tachycardia first and then determine treatment course.  For now I like to see what her heart rate response to exercise is. We will do a GXT to see if we can reduce symptoms. May need to consider structural evaluation with echocardiogram in future.  I think the episode that she had walk on the mountains he could easily be explained by potentially overdoing it without adequate hydration and eating. Dehydration could certainly drive heart rate up faster than usual. Patient was walking uphill and elevation could've come in for her becoming significantly decompensated.      Relevant Orders   EKG 12-Lead (Completed)   EXERCISE TOLERANCE TEST    Other Visit Diagnoses    Other  iron deficiency anemia       Other hyperlipidemia         Very compensated detailed history provided with a 1 page summary of all of her episodes. Reviewing all her information and talking further to close to an hour. Her to 50% time was spent in direct counseling.   Current medicines are reviewed at length with the patient today. (+/- concerns) None The following changes have been made: See below  Patient Instructions  SCHEDULE [address] has requested that you have an exercise tolerance test. For further information please visit [email or web address]. Please also follow instruction sheet, as given.    NO MEDICATION CHANGES   Your physician recommends that you schedule a follow-up appointment in [place].    Studies Ordered:   Orders Placed This Encounter  Procedures  . EXERCISE TOLERANCE TEST  . EKG 12-Lead      Glenetta Hew, M.D., M.S. Interventional Cardiologist   Pager # [phone] Phone # [phone] [address] [place], [place] [address]

## 2016-07-23 NOTE — Patient Instructions (Addendum)
SCHEDULE Bianca Parker has requested that you have an exercise tolerance test. For further information please visit [email or web address]. Please also follow instruction sheet, as given.    NO MEDICATION CHANGES   Your physician recommends that you schedule a follow-up appointment in [place].

## 2016-07-25 ENCOUNTER — Encounter: Payer: Self-pay | Admitting: Cardiology

## 2016-07-25 DIAGNOSIS — R000 Tachycardia, unspecified: Secondary | ICD-10-CM | POA: Insufficient documentation

## 2016-07-25 DIAGNOSIS — R55 Syncope and collapse: Secondary | ICD-10-CM | POA: Insufficient documentation

## 2016-07-25 NOTE — Assessment & Plan Note (Addendum)
Only notice it that one time with severe tachycardia following uphill exertion. Otherwise she has noted her heart rate going fast, but has not gone the point of feeling like she may pass out. That was the worst episode.  We'll need to evaluate why she is having the tachycardia first and then determine treatment course.  For now I like to see what her heart rate response to exercise is. We will do a GXT to see if we can reduce symptoms. May need to consider structural evaluation with echocardiogram in future.  I think the episode that she had walk on the mountains he could easily be explained by potentially overdoing it without adequate hydration and eating. Dehydration could certainly drive heart rate up faster than usual. Patient was walking uphill and elevation could've come in for her becoming significantly decompensated.

## 2016-07-25 NOTE — Assessment & Plan Note (Signed)
This is an exercise-induced tachycardia is limited really to the response to her going hill. She is usually relatively well controlled as far as being in pretty decent shape, but the episode described in the history of present illness was pretty significant spell. Is difficult to tell what this would be. Would like to exclude ischemia and would also want to evaluate to see if we can reproduce this event.  Plan: To begin evaluation we will start off with GXT (graded excess treadmill test) on this will allow [place] to have her walk rapidly in the incline to see if her symptoms can be mimicked.  I think she is a tendency to exercise related tachycardia, and this recent episode could've been triggered by her recovering from a cold, possibly she took Sudafed, it could also be that she was carrying a pack walking longer and harder than she usually does. Dorsalis ELIQUIS truly an anginal type equivalent. She is more of potential arrhythmia type of episode.

## 2016-07-28 ENCOUNTER — Ambulatory Visit (HOSPITAL_BASED_OUTPATIENT_CLINIC_OR_DEPARTMENT_OTHER): Payer: 59

## 2016-07-28 VITALS — BP 108/59 | HR 72 | Temp 98.2°F | Resp 18

## 2016-07-28 DIAGNOSIS — D801 Nonfamilial hypogammaglobulinemia: Secondary | ICD-10-CM | POA: Diagnosis not present

## 2016-07-28 MED ORDER — DIPHENHYDRAMINE HCL 25 MG PO CAPS
ORAL_CAPSULE | ORAL | Status: AC
  Filled 2016-07-28: qty 2

## 2016-07-28 MED ORDER — METHYLPREDNISOLONE SODIUM SUCC 40 MG IJ SOLR
INTRAMUSCULAR | Status: AC
  Filled 2016-07-28: qty 1

## 2016-07-28 MED ORDER — DEXTROSE 5 % IV SOLN
INTRAVENOUS | Status: DC
  Administered 2016-07-28: 08:00:00 via INTRAVENOUS

## 2016-07-28 MED ORDER — ACETAMINOPHEN 325 MG PO TABS
650.0000 mg | ORAL_TABLET | Freq: Once | ORAL | Status: AC
  Administered 2016-07-28: 650 mg via ORAL

## 2016-07-28 MED ORDER — METHYLPREDNISOLONE SODIUM SUCC 40 MG IJ SOLR
40.0000 mg | INTRAMUSCULAR | Status: DC
  Administered 2016-07-28: 40 mg via INTRAVENOUS

## 2016-07-28 MED ORDER — DIPHENHYDRAMINE HCL 25 MG PO TABS
50.0000 mg | ORAL_TABLET | Freq: Once | ORAL | Status: AC
  Administered 2016-07-28: 50 mg via ORAL
  Filled 2016-07-28: qty 2

## 2016-07-28 MED ORDER — ACETAMINOPHEN 325 MG PO TABS
ORAL_TABLET | ORAL | Status: AC
  Filled 2016-07-28: qty 2

## 2016-07-28 MED ORDER — IMMUNE GLOBULIN (HUMAN) 10 GM/100ML IV SOLN
1.0000 g/kg | Freq: Once | INTRAVENOUS | Status: AC
Start: 2016-07-28 — End: 2016-07-28
  Administered 2016-07-28: 65 g via INTRAVENOUS
  Filled 2016-07-28: qty 600

## 2016-07-28 NOTE — Patient Instructions (Signed)

## 2016-07-28 NOTE — Progress Notes (Signed)
Tolerated infusion with no problems.Did not want to stay for 30 minute observation after IVIG infused, vital signs obtained prior to discharge.

## 2016-08-01 ENCOUNTER — Telehealth (HOSPITAL_COMMUNITY): Payer: Self-pay

## 2016-08-01 NOTE — Telephone Encounter (Signed)
Encounter complete. 

## 2016-08-06 ENCOUNTER — Ambulatory Visit (HOSPITAL_COMMUNITY)
Admission: RE | Admit: 2016-08-06 | Discharge: 2016-08-06 | Disposition: A | Discharge status: Home / Self Care | Payer: 59 | Source: Ambulatory Visit | Attending: Cardiology | Admitting: Cardiology

## 2016-08-06 DIAGNOSIS — R000 Tachycardia, unspecified: Secondary | ICD-10-CM | POA: Diagnosis not present

## 2016-08-06 DIAGNOSIS — R55 Syncope and collapse: Secondary | ICD-10-CM

## 2016-08-06 LAB — EXERCISE TOLERANCE TEST
CHL CUP RESTING HR STRESS: 61 {beats}/min
CHL RATE OF PERCEIVED EXERTION: 18
CSEPED: 6 min
CSEPEDS: 44 s
CSEPEW: 8.1 METS
CSEPPHR: 171 {beats}/min
MPHR: 151 {beats}/min
Percent HR: 113 %

## 2016-08-12 ENCOUNTER — Encounter: Payer: Self-pay | Admitting: Cardiology

## 2016-08-12 ENCOUNTER — Ambulatory Visit (INDEPENDENT_AMBULATORY_CARE_PROVIDER_SITE_OTHER): Payer: 59 | Admitting: Cardiology

## 2016-08-12 DIAGNOSIS — R55 Syncope and collapse: Secondary | ICD-10-CM

## 2016-08-12 DIAGNOSIS — R000 Tachycardia, unspecified: Secondary | ICD-10-CM | POA: Diagnosis not present

## 2016-08-12 NOTE — Progress Notes (Signed)
PCP: Haywood Pao, MD  Clinic Note: Chief Complaint  Patient presents with  . Follow-up    exercise related sinus tachycardia  . Leg Pain    cramping in legs @ night randomly.    HPI: Bianca Parker is a 70 y.o. female with a PMH below who presents today for Evaluation of some very interesting rapid heart rate episode that she has been experiencing. Her major health event is having a difficult to manage (CLL as well as hypergammaglobulinemia.). She is a very active woman who enjoys hiking and walking exercises. She usually is eating healthy, and is very health/weight conscious. She used to have frequent infections as a result of her CLL, but has been on TIG infusions monthly him that it really seem to help.  Bianca Parker was seen in initial consultation on November 29: She was describing an episode of extreme tachycardia with near syncope and fatigue wall on a hike. She usually does quite well, but on this occasion on likely in the mountains she may have overexerted and had a very significant tachycardic response. She almost passed out despite having tried to sit calmly for at least a half an hour prior to starting to walk again.  Recent Hospitalizations: None  Studies Reviewed:   GXT: Normal blood pressure response to exercise. No EKG changes to suggest ischemia. Heart rate did increase to 177 bpm = 113% Max Predicted HR of 151 bpm  Interval History: She presents today to review the results of her GXT. She clearly did well enough on her GXT, but had significant tachycardic response with a heart rate of 177 bpm. It did appear to be sinus rhythm and not SVT. She's not had any further episodes since the last visit like the one she described mountains. She is actually tried to get back into doing some activity, just not as much.  She's not had any further syncope or near syncopal type symptoms. No chest tightness pressure with rest or exertion. No PND, orthopnea or edema. No  TIA/amaurosis fugax symptoms. No melena, hematochezia, hematuria, or epstaxis. No claudication.  ROS: A comprehensive was performed. Review of Systems  Constitutional: Negative for fever and malaise/fatigue.  HENT: Negative for congestion, hearing loss and nosebleeds.   Eyes: Negative.   Respiratory: Negative for cough, shortness of breath (With exertion when her heart rate goes very fast) and wheezing.   Cardiovascular:       Per history of present illness  Gastrointestinal: Positive for constipation. Negative for blood in stool and melena.  Genitourinary: Negative for hematuria.  Musculoskeletal: Negative for back pain, falls, joint pain and myalgias.  Neurological: Positive for dizziness (Per history of present illness). Negative for tingling, tremors, focal weakness and seizures.  Endo/Heme/Allergies: Bruises/bleeds easily.  Psychiatric/Behavioral: Negative for hallucinations and memory loss.    Past Medical History:  Diagnosis Date  . Allergic rhinitis, cause unspecified   . Anemia   . CARCINOMA, SKIN, SQUAMOUS CELL   . Chronic lymphoid leukemia in remission [facility]) 2002 dx, tx 2003   Diagnosed in July 2002. Started on clinical trial at [facility] in June 2003. Now she is reportedly in remission but has a very compromised immune system. She is intermittently been doing IVIG and then was restarted back in July 2016. Now she doesn't have a month.   . Dyslipidemia   . Hepatitis A antibody positive 04/06/2012   Acute diarrheal illness & elevated liver function tests 02/18/12;   . Hyperlipidemia   .  Hypogammaglobulinemia, acquired ([place]) 11/18/2011   Due to CLL and prior chemo  . Rectal bleeding 07/24/2015   Relevant past medical history: When she was younger she noticed that her heart rate would usually go up too quickly when she would job uphill. Currently she did have it checked out in the past and was told that she had some type of issue between the timing of her chambers was not  involved with mitral valve prolapse or atrial fibrillation. She was told it was benign and didn't want.   Past Surgical History:  Procedure Laterality Date  . ABDOMINAL HYSTERECTOMY  1966  . BUNIONECTOMY    . SIGMOIDOSCOPY    . SKIN BIOPSY    . TONSILLECTOMY     Current Meds  Medication Sig  . Calcium Carbonate-Vitamin D (CALTRATE 600+D) 600-400 MG-UNIT per tablet Take 1 tablet by mouth daily.  Marland Kitchen doxycycline (VIBRA-TABS) 100 MG tablet Take 1 tablet (100 mg total) by mouth 2 (two) times daily.  . fexofenadine (ALLEGRA) 180 MG tablet Take 180 mg by mouth daily.    Marland Kitchen guaiFENesin (MUCINEX) 600 MG 12 hr tablet Take by mouth daily as needed. Reported on 08/24/2015  . levothyroxine (SYNTHROID, LEVOTHROID) 50 MCG tablet Take 50 mcg by mouth daily before breakfast.   . Multiple Vitamin (MULTIVITAMIN) tablet Take 1 tablet by mouth daily.  . mupirocin ointment (BACTROBAN) 2 % Apply 1 application topically as needed.   . valACYclovir (VALTREX) 500 MG tablet Take 500 mg by mouth as needed.  . [DISCONTINUED] oseltamivir (TAMIFLU) 75 MG capsule Take 1 capsule (75 mg total) by mouth 2 (two) times daily.   Allergies  Allergen Reactions  . Penicillins   . Pentamidine   . Sulfonamide Derivatives     Social History   Social History  . Marital status: Married    Spouse name: N/A  . Number of children: N/A  . Years of education: N/A   Social History Main Topics  . Smoking status: Never Smoker  . Smokeless tobacco: Never Used     Comment: Married, lives with husband Gershon Crane), grown children. 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 Alcohol use Yes     Comment: Rare  . Drug use: No  . Sexual activity: Yes   Other Topics Concern  . None   Social History Narrative  . None   Family History  Problem Relation Age of Onset  . Colon cancer Mother   . Cancer Mother     colon  . Cancer Maternal Grandmother     ovarian  . Cancer Paternal Grandmother     breast  . Breast cancer Other     . Ovarian cancer Other      Wt Readings from Last 3 Encounters:  08/12/16 65.5 kg (144 lb 6.4 oz)  07/23/16 66.6 kg (146 lb 12.8 oz)  06/02/16 64.6 kg (142 lb 8 oz)    PHYSICAL EXAM BP 124/73   Pulse 75   Ht 5\' 3"  (1.6 m)   Wt 65.5 kg (144 lb 6.4 oz)   BMI 25.58 kg/m   General appearance: alert, cooperative, appears stated age, no distress and Well-nourished, well-groomed. Healthy-appearing. Normal mood and affect. HEENT: [place]/AT, EOMI, MMM, anicteric sclera Neck: no adenopathy, no carotid bruit and no JVD Lungs: clear to auscultation bilaterally, normal percussion bilaterally and non-labored Heart: regular rate and rhythm, S1 and S2 normal, no murmur, click, rub or gallop; nondisplaced PMI Abdomen: soft, non-tender; bowel sounds normal; no masses,  no organomegaly;  no HJR Extremities: extremities normal, atraumatic, no cyanosis, or edema  Pulses: 2+ and symmetric;  Skin: normal, no edema, no evidence of bleeding or bruising and no lesions noted  Neurologic: Mental status: Alert, oriented, thought content appropriate Cranial nerves: normal (II-XII grossly intact)    Adult ECG Report - n/a  Other studies Reviewed: Additional studies/ records that were reviewed today include:  Recent Labs:  05/22/2016  Sodium 141, potassium 4.9, chloride 105, bicarbonate 28, BUN 18, creatinine 0.9, glucose 80, calcium 9.7; total protein 8.0, Augmentin 3.8; AST/ALT 50/42 (borderline elevated, alkaline phosphatase 49, total bilirubin 0.6.  CBC: W4.0, H/H 15/45.9, platelets 142.;; TSH 1.62, free T4 1 0.2, vitamin D 4.5  TC 208, TG 88, HDL 57, LDL 133  ASSESSMENT / PLAN: Problem List Items Addressed This Visit    Exercise-induced tachycardia    Very unusual situation. We talked about potential options for management here including a beta blocker. She was not very excited about the idea being on a standing beta blocker. I think the plan would be to ensure that she is actively hydrated. And  monitor heart rates so that if it goes above 100 feet 50 beats minute, she would stop and rest and drink.  She wanted to do a conservative management option and see how things do. I will discuss with electrophysiology if there are any other potential options.      Pre-syncope    Related to her sinus tachycardia episode. This probably had to do with her heart rate 70 not coming down. Thankfully with exercise even though her heart rate when up, the blood pressure came down nicely after 1 minute. I think if she was dehydrated and it is orders up beyond a safe limit, it probably took longer for her to recover. She needs to hydrate prior to this physical exertion on the days leading up to the exercise as opposed to just on the day.        Current medicines are reviewed at length with the patient today. (+/- concerns) None The following changes have been made: See below  Patient Instructions  NO CHANGE WITH CURRENT MEDICATIONS   KEEP HYDRATED-BEFORE EXERCISING AND HIKING  MAX HEART RATE FOR YOU IS 150 BEAT A [place]  Your physician recommends that you schedule a follow-up appointment in [place]     Studies Ordered:   No orders of the defined types were placed in this encounter.     Glenetta Hew, M.D., M.S. Interventional Cardiologist   Pager # [phone] Phone # [phone] [address] [employer] [place], [place] [address]

## 2016-08-12 NOTE — Patient Instructions (Addendum)
NO CHANGE WITH CURRENT MEDICATIONS   KEEP HYDRATED-BEFORE EXERCISING AND HIKING  MAX HEART RATE FOR YOU IS 150 BEAT A MINUTES,STOP AND REST AND DRINK FLUIDS  Your physician recommends that you schedule a follow-up appointment in [place]

## 2016-08-14 ENCOUNTER — Encounter: Payer: Self-pay | Admitting: Cardiology

## 2016-08-14 NOTE — Assessment & Plan Note (Signed)
Related to her sinus tachycardia episode. This probably had to do with her heart rate 70 not coming down. Thankfully with exercise even though her heart rate when up, the blood pressure came down nicely after 1 minute. I think if she was dehydrated and it is orders up beyond a safe limit, it probably took longer for her to recover. She needs to hydrate prior to this physical exertion on the days leading up to the exercise as opposed to just on the day.

## 2016-08-14 NOTE — Assessment & Plan Note (Signed)
Very unusual situation. We talked about potential options for management here including a beta blocker. She was not very excited about the idea being on a standing beta blocker. I think the plan would be to ensure that she is actively hydrated. And monitor heart rates so that if it goes above 100 feet 50 beats minute, she would stop and rest and drink.  She wanted to do a conservative management option and see how things do. I will discuss with electrophysiology if there are any other potential options.

## 2016-08-22 ENCOUNTER — Ambulatory Visit (HOSPITAL_BASED_OUTPATIENT_CLINIC_OR_DEPARTMENT_OTHER): Payer: 59

## 2016-08-22 VITALS — BP 121/62 | HR 63 | Temp 98.6°F | Resp 17

## 2016-08-22 DIAGNOSIS — D801 Nonfamilial hypogammaglobulinemia: Secondary | ICD-10-CM | POA: Diagnosis not present

## 2016-08-22 MED ORDER — METHYLPREDNISOLONE SODIUM SUCC 40 MG IJ SOLR
INTRAMUSCULAR | Status: AC
  Filled 2016-08-22: qty 1

## 2016-08-22 MED ORDER — DIPHENHYDRAMINE HCL 25 MG PO CAPS
ORAL_CAPSULE | ORAL | Status: AC
  Filled 2016-08-22: qty 2

## 2016-08-22 MED ORDER — SODIUM CHLORIDE 0.9 % IV SOLN: INTRAVENOUS | Status: DC

## 2016-08-22 MED ORDER — METHYLPREDNISOLONE SODIUM SUCC 40 MG IJ SOLR
40.0000 mg | INTRAMUSCULAR | Status: DC
  Administered 2016-08-22: 40 mg via INTRAVENOUS

## 2016-08-22 MED ORDER — IMMUNE GLOBULIN (HUMAN) 10 GM/100ML IV SOLN
1.0000 g/kg | Freq: Once | INTRAVENOUS | Status: AC
  Administered 2016-08-22: 65 g via INTRAVENOUS
  Filled 2016-08-22: qty 600

## 2016-08-22 MED ORDER — DIPHENHYDRAMINE HCL 25 MG PO TABS
50.0000 mg | ORAL_TABLET | Freq: Once | ORAL | Status: AC
Start: 2016-08-22 — End: 2016-08-22
  Administered 2016-08-22: 50 mg via ORAL
  Filled 2016-08-22: qty 2

## 2016-08-22 MED ORDER — ACETAMINOPHEN 325 MG PO TABS
ORAL_TABLET | ORAL | Status: AC
  Filled 2016-08-22: qty 2

## 2016-08-22 MED ORDER — ACETAMINOPHEN 325 MG PO TABS
650.0000 mg | ORAL_TABLET | Freq: Once | ORAL | Status: AC
  Administered 2016-08-22: 650 mg via ORAL

## 2016-08-22 MED ORDER — DEXTROSE 5 % IV SOLN
Freq: Once | INTRAVENOUS | Status: AC
  Administered 2016-08-22: 09:00:00 via INTRAVENOUS

## 2016-08-22 NOTE — Patient Instructions (Signed)

## 2016-09-22 ENCOUNTER — Ambulatory Visit (HOSPITAL_BASED_OUTPATIENT_CLINIC_OR_DEPARTMENT_OTHER): Payer: 59

## 2016-09-22 VITALS — BP 126/53 | HR 73 | Temp 98.2°F | Resp 18

## 2016-09-22 DIAGNOSIS — D801 Nonfamilial hypogammaglobulinemia: Secondary | ICD-10-CM

## 2016-09-22 MED ORDER — METHYLPREDNISOLONE SODIUM SUCC 40 MG IJ SOLR
INTRAMUSCULAR | Status: AC
  Filled 2016-09-22: qty 1

## 2016-09-22 MED ORDER — METHYLPREDNISOLONE SODIUM SUCC 40 MG IJ SOLR
40.0000 mg | INTRAMUSCULAR | Status: DC
  Administered 2016-09-22: 40 mg via INTRAVENOUS

## 2016-09-22 MED ORDER — IMMUNE GLOBULIN (HUMAN) 10 GM/100ML IV SOLN
65.0000 g | Freq: Once | INTRAVENOUS | Status: AC
  Administered 2016-09-22: 65 g via INTRAVENOUS
  Filled 2016-09-22: qty 600

## 2016-09-22 MED ORDER — DEXTROSE 5 % IV SOLN
Freq: Once | INTRAVENOUS | Status: AC
  Administered 2016-09-22: 08:00:00 via INTRAVENOUS

## 2016-09-22 MED ORDER — DIPHENHYDRAMINE HCL 25 MG PO CAPS
ORAL_CAPSULE | ORAL | Status: AC
  Filled 2016-09-22: qty 2

## 2016-09-22 MED ORDER — DIPHENHYDRAMINE HCL 25 MG PO TABS
50.0000 mg | ORAL_TABLET | Freq: Once | ORAL | Status: AC
  Administered 2016-09-22: 50 mg via ORAL
  Filled 2016-09-22: qty 2

## 2016-09-22 MED ORDER — SODIUM CHLORIDE 0.9 % IV SOLN: Freq: Once | INTRAVENOUS | Status: DC

## 2016-09-22 MED ORDER — ACETAMINOPHEN 325 MG PO TABS
650.0000 mg | ORAL_TABLET | Freq: Once | ORAL | Status: AC
  Administered 2016-09-22: 650 mg via ORAL

## 2016-09-22 MED ORDER — ACETAMINOPHEN 325 MG PO TABS
ORAL_TABLET | ORAL | Status: AC
  Filled 2016-09-22: qty 2

## 2016-09-22 NOTE — Progress Notes (Signed)
Pt refused 30 minute post observation. Pt tolerated IVIG infusion well. Pt and VS stable at discharge.

## 2016-09-22 NOTE — Patient Instructions (Signed)

## 2016-10-20 ENCOUNTER — Ambulatory Visit (HOSPITAL_BASED_OUTPATIENT_CLINIC_OR_DEPARTMENT_OTHER): Payer: 59

## 2016-10-20 VITALS — BP 124/56 | HR 68 | Temp 97.2°F | Resp 18

## 2016-10-20 DIAGNOSIS — D801 Nonfamilial hypogammaglobulinemia: Secondary | ICD-10-CM

## 2016-10-20 MED ORDER — METHYLPREDNISOLONE SODIUM SUCC 40 MG IJ SOLR
INTRAMUSCULAR | Status: AC
  Filled 2016-10-20: qty 1

## 2016-10-20 MED ORDER — DIPHENHYDRAMINE HCL 25 MG PO TABS
50.0000 mg | ORAL_TABLET | Freq: Once | ORAL | Status: AC
  Administered 2016-10-20: 50 mg via ORAL
  Filled 2016-10-20: qty 2

## 2016-10-20 MED ORDER — DIPHENHYDRAMINE HCL 25 MG PO CAPS
ORAL_CAPSULE | ORAL | Status: AC
  Filled 2016-10-20: qty 2

## 2016-10-20 MED ORDER — IMMUNE GLOBULIN (HUMAN) 10 GM/100ML IV SOLN
65.0000 g | Freq: Once | INTRAVENOUS | Status: AC
  Administered 2016-10-20: 65 g via INTRAVENOUS
  Filled 2016-10-20: qty 600

## 2016-10-20 MED ORDER — METHYLPREDNISOLONE SODIUM SUCC 40 MG IJ SOLR
40.0000 mg | INTRAMUSCULAR | Status: DC
  Administered 2016-10-20: 40 mg via INTRAVENOUS

## 2016-10-20 MED ORDER — ACETAMINOPHEN 325 MG PO TABS
650.0000 mg | ORAL_TABLET | Freq: Once | ORAL | Status: AC
  Administered 2016-10-20: 650 mg via ORAL

## 2016-10-20 MED ORDER — DEXTROSE 5 % IV SOLN
Freq: Once | INTRAVENOUS | Status: AC
  Administered 2016-10-20: 08:00:00 via INTRAVENOUS

## 2016-10-20 MED ORDER — ACETAMINOPHEN 325 MG PO TABS
ORAL_TABLET | ORAL | Status: AC
  Filled 2016-10-20: qty 2

## 2016-10-20 NOTE — Patient Instructions (Signed)

## 2016-11-17 ENCOUNTER — Ambulatory Visit (HOSPITAL_BASED_OUTPATIENT_CLINIC_OR_DEPARTMENT_OTHER): Payer: 59

## 2016-11-17 VITALS — BP 145/78 | HR 69 | Temp 98.9°F | Resp 18

## 2016-11-17 DIAGNOSIS — D801 Nonfamilial hypogammaglobulinemia: Secondary | ICD-10-CM

## 2016-11-17 MED ORDER — DIPHENHYDRAMINE HCL 25 MG PO CAPS
ORAL_CAPSULE | ORAL | Status: AC
  Filled 2016-11-17: qty 2

## 2016-11-17 MED ORDER — METHYLPREDNISOLONE SODIUM SUCC 40 MG IJ SOLR
INTRAMUSCULAR | Status: AC
  Filled 2016-11-17: qty 1

## 2016-11-17 MED ORDER — ACETAMINOPHEN 325 MG PO TABS
650.0000 mg | ORAL_TABLET | Freq: Once | ORAL | Status: AC
  Administered 2016-11-17: 650 mg via ORAL

## 2016-11-17 MED ORDER — ACETAMINOPHEN 325 MG PO TABS
ORAL_TABLET | ORAL | Status: AC
  Filled 2016-11-17: qty 2

## 2016-11-17 MED ORDER — DIPHENHYDRAMINE HCL 25 MG PO TABS
50.0000 mg | ORAL_TABLET | Freq: Once | ORAL | Status: AC
  Administered 2016-11-17: 50 mg via ORAL
  Filled 2016-11-17: qty 2

## 2016-11-17 MED ORDER — METHYLPREDNISOLONE SODIUM SUCC 40 MG IJ SOLR
40.0000 mg | INTRAMUSCULAR | Status: DC
  Administered 2016-11-17: 40 mg via INTRAVENOUS

## 2016-11-17 MED ORDER — SODIUM CHLORIDE 0.9 % IV SOLN
INTRAVENOUS | Status: DC
  Administered 2016-11-17: 09:00:00 via INTRAVENOUS

## 2016-11-17 MED ORDER — IMMUNE GLOBULIN (HUMAN) 10 GM/100ML IV SOLN
65.0000 g | Freq: Once | INTRAVENOUS | Status: AC
  Administered 2016-11-17: 65 g via INTRAVENOUS
  Filled 2016-11-17: qty 600

## 2016-11-17 NOTE — Patient Instructions (Signed)

## 2016-12-15 ENCOUNTER — Telehealth: Payer: Self-pay | Admitting: Hematology and Oncology

## 2016-12-15 ENCOUNTER — Telehealth: Payer: Self-pay | Admitting: Nurse Practitioner

## 2016-12-15 ENCOUNTER — Encounter: Payer: Self-pay | Admitting: Hematology and Oncology

## 2016-12-15 ENCOUNTER — Ambulatory Visit (HOSPITAL_BASED_OUTPATIENT_CLINIC_OR_DEPARTMENT_OTHER): Payer: 59 | Admitting: Hematology and Oncology

## 2016-12-15 ENCOUNTER — Other Ambulatory Visit (HOSPITAL_BASED_OUTPATIENT_CLINIC_OR_DEPARTMENT_OTHER): Payer: 59

## 2016-12-15 ENCOUNTER — Ambulatory Visit (HOSPITAL_BASED_OUTPATIENT_CLINIC_OR_DEPARTMENT_OTHER): Payer: 59

## 2016-12-15 VITALS — BP 146/82 | HR 88 | Temp 99.2°F | Resp 16

## 2016-12-15 DIAGNOSIS — D801 Nonfamilial hypogammaglobulinemia: Secondary | ICD-10-CM | POA: Diagnosis not present

## 2016-12-15 DIAGNOSIS — R635 Abnormal weight gain: Secondary | ICD-10-CM | POA: Diagnosis not present

## 2016-12-15 DIAGNOSIS — C9191 Lymphoid leukemia, unspecified, in remission: Secondary | ICD-10-CM | POA: Diagnosis not present

## 2016-12-15 DIAGNOSIS — D696 Thrombocytopenia, unspecified: Secondary | ICD-10-CM

## 2016-12-15 DIAGNOSIS — C9111 Chronic lymphocytic leukemia of B-cell type in remission: Secondary | ICD-10-CM

## 2016-12-15 LAB — CBC WITH DIFFERENTIAL/PLATELET
BASO%: 0.3 % (ref 0.0–2.0)
BASOS ABS: 0 10*3/uL (ref 0.0–0.1)
EOS ABS: 0.1 10*3/uL (ref 0.0–0.5)
EOS%: 2.3 % (ref 0.0–7.0)
HCT: 42.9 % (ref 34.8–46.6)
HGB: 14.5 g/dL (ref 11.6–15.9)
LYMPH%: 38.8 % (ref 14.0–49.7)
MCH: 30.2 pg (ref 25.1–34.0)
MCHC: 33.8 g/dL (ref 31.5–36.0)
MCV: 89.5 fL (ref 79.5–101.0)
MONO#: 0.4 10*3/uL (ref 0.1–0.9)
MONO%: 7.8 % (ref 0.0–14.0)
NEUT%: 50.8 % (ref 38.4–76.8)
NEUTROS ABS: 2.6 10*3/uL (ref 1.5–6.5)
PLATELETS: 141 10*3/uL — AB (ref 145–400)
RBC: 4.79 10*6/uL (ref 3.70–5.45)
RDW: 14.1 % (ref 11.2–14.5)
WBC: 5 10*3/uL (ref 3.9–10.3)
lymph#: 2 10*3/uL (ref 0.9–3.3)

## 2016-12-15 MED ORDER — ACETAMINOPHEN 325 MG PO TABS
ORAL_TABLET | ORAL | Status: AC
  Filled 2016-12-15: qty 2

## 2016-12-15 MED ORDER — METHYLPREDNISOLONE SODIUM SUCC 40 MG IJ SOLR
INTRAMUSCULAR | Status: AC
  Filled 2016-12-15: qty 1

## 2016-12-15 MED ORDER — DIPHENHYDRAMINE HCL 25 MG PO TABS
50.0000 mg | ORAL_TABLET | Freq: Once | ORAL | Status: AC
  Administered 2016-12-15: 50 mg via ORAL
  Filled 2016-12-15: qty 2

## 2016-12-15 MED ORDER — DIPHENHYDRAMINE HCL 25 MG PO CAPS
ORAL_CAPSULE | ORAL | Status: AC
  Filled 2016-12-15: qty 2

## 2016-12-15 MED ORDER — IMMUNE GLOBULIN (HUMAN) 10 GM/100ML IV SOLN
65.0000 g | Freq: Once | INTRAVENOUS | Status: AC
  Administered 2016-12-15: 65 g via INTRAVENOUS
  Filled 2016-12-15: qty 600

## 2016-12-15 MED ORDER — ACETAMINOPHEN 325 MG PO TABS
650.0000 mg | ORAL_TABLET | Freq: Once | ORAL | Status: AC
  Administered 2016-12-15: 650 mg via ORAL

## 2016-12-15 MED ORDER — METHYLPREDNISOLONE SODIUM SUCC 40 MG IJ SOLR
20.0000 mg | INTRAMUSCULAR | Status: DC
  Administered 2016-12-15: 20 mg via INTRAVENOUS

## 2016-12-15 NOTE — Assessment & Plan Note (Signed)
Clinically, she remained in remission. Continues annual follow-up with history, physical examination and blood work.  

## 2016-12-15 NOTE — Assessment & Plan Note (Signed)
This is related to long-term side effects from prior chemotherapy. She has recurrent infection with herpesvirus, bronchitis, tooth abscess and skin infections She is already on chronic treatment with Valtrex daily. She is doing well and will continue IVIG treatment at 1 g/kg every [redacted] weeks along with premedication with Benadryl, Tylenol and Solu-Medrol. The risk, benefit, side effects of IVIG is fully discussed with the patient and she agreed to proceed. She has minor serum sickness with Octagam and Privigen. According to the patient, she does better with slower infusion. We will budget 8 hours infusion for her each time

## 2016-12-15 NOTE — Assessment & Plan Note (Signed)
The cause is unknown, could be due to consumption or treatment related It is mild and there is little change compared from previous platelet count.  She is asymptomatic from the thrombocytopenia. I will observe for now.  

## 2016-12-15 NOTE — Assessment & Plan Note (Signed)
She has nearly 30 pound weight gain since December Her blood pressure has also been coming up and down. I recommend exercise and weight loss program as tolerated. One major problem is related to premedication with Solu-Medrol causing her to have excessive appetite I plan to reduce Solu-Medrol to 20 mg

## 2016-12-15 NOTE — Telephone Encounter (Signed)
Appointments scheduled per 4.23.18 LOS. Patient given AVS report and calendars.

## 2016-12-15 NOTE — Telephone Encounter (Signed)
Called patient to inform her of 4.30.18 appointment time change from morning to afternoon per patient request. No answer/ voicemail to leave message.

## 2016-12-15 NOTE — Patient Instructions (Signed)

## 2016-12-15 NOTE — Progress Notes (Signed)
[facility] OFFICE PROGRESS NOTE  Patient Care Team: Haywood Pao, MD as PCP - General (Internal Medicine) Deneise Lever, MD as Referring Physician (Pulmonary Disease) Rozetta Nunnery, MD as Referring Physician (Otolaryngology) Milus Banister, MD (Gastroenterology) Arvella Nigh, MD (Obstetrics and Gynecology) Heath Lark, MD as Consulting Physician (Hematology and Oncology) Jari Pigg, MD as Consulting Physician (Dermatology) Marygrace Drought, MD as Consulting Physician (Ophthalmology)  SUMMARY OF ONCOLOGIC HISTORY:  She was in one of the first cohorts of patients treated with FCR on protocol at the [facility] at time of her initial diagnosis in July 2002. Technically she had RAI  stage III disease due to anemia but she had no organomegaly or lymphadenopathy on exam. FCR was started in 12/2001, and continued through 06/2002. She achieved a complete hematologic response and,which has been durable now for over 11 years!  She did develop hypogammaglobulinemia. As a result, she gets recurrent bronchitis and sinusitis. In April 2013 she was started her on monthly intravenous immunoglobulin 400 mg per kilogram and this has significantly impacted on the frequency and severity of her infections. This was continued through April of 2014. She has had no new infections since stopping the IVIG but as expected, immunoglobulin levels have fallen to pretreatment levels.   She has a history of her recurrent squamous cell carcinomas of her skin and has had 2 lesions excised from her left tibial area and another 1 excised from the vulvar area. Recently, she had a basal cell carcinoma removed. She has a history of both herpes zoster and herpes simplex infections. Recently, she had abnormal Pap smear and currently on close observation. Recent colposcopy was negative.  She received IVIG treatment for acquired hypogammaglobulinemia but stopped in April 2014 due to sick serum  syndrome In July 2016, IVIG is resumed with premedication In August 2016, she developed cellulitis from venipuncture, resolved with antibiotics  INTERVAL HISTORY: Please see below for problem oriented charting. Since the last time I saw her, she denies further serum sickness with slow infusion She has gained over 30 pounds of weight since December She has significant appetite after treatment and reduce exercise due to recent hip pain In January, she developed abscess in her tooth requiring multiple courses of antibiotics She also have some eye infection, resolved with topical eye drops She denies flulike illness or pneumonia over the past 6 months The patient denies any recent signs or symptoms of bleeding such as spontaneous epistaxis, hematuria or hematochezia.  REVIEW OF SYSTEMS:   Constitutional: Denies fevers, chills or abnormal weight loss Eyes: Denies blurriness of vision Ears, nose, mouth, throat, and face: Denies mucositis or sore throat Respiratory: Denies cough, dyspnea or wheezes Cardiovascular: Denies palpitation, chest discomfort or lower extremity swelling Gastrointestinal:  Denies nausea, heartburn or change in bowel habits Skin: Denies abnormal skin rashes Lymphatics: Denies new lymphadenopathy or easy bruising Neurological:Denies numbness, tingling or new weaknesses Behavioral/Psych: Mood is stable, no new changes  All other systems were reviewed with the patient and are negative.  I have reviewed the past medical history, past surgical history, social history and family history with the patient and they are unchanged from previous note.  ALLERGIES:  is allergic to penicillins; pentamidine; and sulfonamide derivatives.  MEDICATIONS:  Current Outpatient Prescriptions  Medication Sig Dispense Refill  . Calcium Carbonate-Vitamin D (CALTRATE 600+D) 600-400 MG-UNIT per tablet Take 1 tablet by mouth daily.    . fexofenadine (ALLEGRA) 180 MG tablet Take 180 mg by mouth  daily.      . guaiFENesin (MUCINEX) 600 MG 12 hr tablet Take by mouth daily as needed. Reported on 08/24/2015    . levothyroxine (SYNTHROID, LEVOTHROID) 50 MCG tablet Take 50 mcg by mouth daily before breakfast.     . Multiple Vitamin (MULTIVITAMIN) tablet Take 1 tablet by mouth daily.    . mupirocin ointment (BACTROBAN) 2 % Apply 1 application topically as needed.     . valACYclovir (VALTREX) 500 MG tablet Take 500 mg by mouth as needed.    . doxycycline (VIBRA-TABS) 100 MG tablet Take 1 tablet (100 mg total) by mouth 2 (two) times daily. (Patient not taking: Reported on 12/15/2016) 14 tablet 0   No current facility-administered medications for this visit.    Facility-Administered Medications Ordered in Other Visits  Medication Dose Route Frequency Provider Last Rate Last Dose  . acetaminophen (TYLENOL) tablet 650 mg  650 mg Oral Once Heath Lark, MD      . diphenhydrAMINE (BENADRYL) tablet 50 mg  50 mg Oral Once Heath Lark, MD      . Immune Globulin 10% (PRIVIGEN) IV infusion 65 g  65 g Intravenous Once Heath Lark, MD      . methylPREDNISolone sodium succinate (SOLU-MEDROL) 40 mg/mL injection 20 mg  20 mg Intravenous Q30 days Heath Lark, MD        PHYSICAL EXAMINATION: ECOG PERFORMANCE STATUS: 1 - Symptomatic but completely ambulatory  Vitals:   12/15/16 0810  BP: (!) 156/88  Pulse: 85  Resp: 18  Temp: 97.7 F (36.5 C)   Filed Weights   12/15/16 0810  Weight: 175 lb 9.6 oz (79.7 kg)    GENERAL:alert, no distress and comfortable SKIN: skin color, texture, turgor are normal, no rashes or significant lesions EYES: normal, Conjunctiva are pink and non-injected, sclera clear OROPHARYNX:no exudate, no erythema and lips, buccal mucosa, and tongue normal  NECK: supple, thyroid normal size, non-tender, without nodularity LYMPH:  no palpable lymphadenopathy in the cervical, axillary or inguinal LUNGS: clear to auscultation and percussion with normal breathing effort HEART: regular rate  & rhythm and no murmurs and no lower extremity edema ABDOMEN:abdomen soft, non-tender and normal bowel sounds Musculoskeletal:no cyanosis of digits and no clubbing  NEURO: alert & oriented x 3 with fluent speech, no focal motor/sensory deficits  LABORATORY DATA:  I have reviewed the data as listed    Component Value Date/Time   NA 140 06/04/2015 0829   K 3.8 06/04/2015 0829   CL 104 05/22/2014 0737   CL 109 (H) 01/20/2013 0801   CO2 21 (L) 06/04/2015 0829   GLUCOSE 120 06/04/2015 0829   GLUCOSE 89 01/20/2013 0801   BUN 18.8 06/04/2015 0829   CREATININE 1.1 06/04/2015 0829   CALCIUM 9.4 06/04/2015 0829   PROT 7.1 06/04/2015 0829   ALBUMIN 3.7 06/04/2015 0829   AST 34 06/04/2015 0829   ALT 33 06/04/2015 0829   ALKPHOS 51 06/04/2015 0829   BILITOT 0.42 06/04/2015 0829   GFRNONAA 57 (L) 04/18/2009 0943   GFRAA  04/18/2009 0943    >60        The eGFR has been calculated using the MDRD equation. This calculation has not been validated in all clinical situations. eGFR's persistently <60 mL/min signify possible Chronic Kidney Disease.    No results found for: SPEP, UPEP  Lab Results  Component Value Date   WBC 5.0 12/15/2016   NEUTROABS 2.6 12/15/2016   HGB 14.5 12/15/2016   HCT 42.9 12/15/2016   MCV  89.5 12/15/2016   PLT 141 (L) 12/15/2016      Chemistry      Component Value Date/Time   NA 140 06/04/2015 0829   K 3.8 06/04/2015 0829   CL 104 05/22/2014 0737   CL 109 (H) 01/20/2013 0801   CO2 21 (L) 06/04/2015 0829   BUN 18.8 06/04/2015 0829   CREATININE 1.1 06/04/2015 0829   GLU 92 06/17/2013      Component Value Date/Time   CALCIUM 9.4 06/04/2015 0829   ALKPHOS 51 06/04/2015 0829   AST 34 06/04/2015 0829   ALT 33 06/04/2015 0829   BILITOT 0.42 06/04/2015 0829       ASSESSMENT & PLAN:  Chronic lymphoid leukemia in remission Clinically, she remained in remission. Continues annual follow-up with history, physical examination and blood  work.   Hypogammaglobulinemia, acquired This is related to long-term side effects from prior chemotherapy. She has recurrent infection with herpesvirus, bronchitis, tooth abscess and skin infections She is already on chronic treatment with Valtrex daily. She is doing well and will continue IVIG treatment at 1 g/kg every [redacted] weeks along with premedication with Benadryl, Tylenol and Solu-Medrol. The risk, benefit, side effects of IVIG is fully discussed with the patient and she agreed to proceed. She has minor serum sickness with Octagam and Privigen. According to the patient, she does better with slower infusion. We will budget 8 hours infusion for her each time  Thrombocytopenia ([place]) The cause is unknown, could be due to consumption or treatment related It is mild and there is little change compared from previous platelet count.  She is asymptomatic from the thrombocytopenia. I will observe for now.   Excessive weight gain She has nearly 30 pound weight gain since December Her blood pressure has also been coming up and down. I recommend exercise and weight loss program as tolerated. One major problem is related to premedication with Solu-Medrol causing her to have excessive appetite I plan to reduce Solu-Medrol to 20 mg   No orders of the defined types were placed in this encounter.  All questions were answered. The patient knows to call the clinic with any problems, questions or concerns. No barriers to learning was detected. I spent 15 minutes counseling the patient face to face. The total time spent in the appointment was 20 minutes and more than 50% was on counseling and review of test results     Heath Lark, MD 12/15/2016 9:07 AM

## 2016-12-16 LAB — IGG, IGA, IGM
IgA, Qn, Serum: <5 mg/dL — ABNORMAL LOW (ref 87–352)
IgM, Qn, Serum: <5 mg/dL — ABNORMAL LOW (ref 26–217)

## 2017-01-12 ENCOUNTER — Ambulatory Visit (HOSPITAL_BASED_OUTPATIENT_CLINIC_OR_DEPARTMENT_OTHER): Payer: 59

## 2017-01-12 VITALS — BP 133/79 | HR 71 | Temp 98.9°F | Resp 16

## 2017-01-12 DIAGNOSIS — D801 Nonfamilial hypogammaglobulinemia: Secondary | ICD-10-CM

## 2017-01-12 MED ORDER — ACETAMINOPHEN 325 MG PO TABS
650.0000 mg | ORAL_TABLET | Freq: Once | ORAL | Status: AC
  Administered 2017-01-12: 650 mg via ORAL

## 2017-01-12 MED ORDER — ACETAMINOPHEN 325 MG PO TABS
ORAL_TABLET | ORAL | Status: AC
  Filled 2017-01-12: qty 2

## 2017-01-12 MED ORDER — IMMUNE GLOBULIN (HUMAN) 10 GM/100ML IV SOLN
65.0000 g | Freq: Once | INTRAVENOUS | Status: AC
  Administered 2017-01-12: 65 g via INTRAVENOUS
  Filled 2017-01-12: qty 600

## 2017-01-12 MED ORDER — DIPHENHYDRAMINE HCL 25 MG PO CAPS
ORAL_CAPSULE | ORAL | Status: AC
  Filled 2017-01-12: qty 2

## 2017-01-12 MED ORDER — METHYLPREDNISOLONE SODIUM SUCC 40 MG IJ SOLR
20.0000 mg | INTRAMUSCULAR | Status: DC
  Administered 2017-01-12: 20 mg via INTRAVENOUS

## 2017-01-12 MED ORDER — DIPHENHYDRAMINE HCL 25 MG PO TABS
50.0000 mg | ORAL_TABLET | Freq: Once | ORAL | Status: AC
  Administered 2017-01-12: 50 mg via ORAL
  Filled 2017-01-12: qty 2

## 2017-01-12 MED ORDER — METHYLPREDNISOLONE SODIUM SUCC 40 MG IJ SOLR
INTRAMUSCULAR | Status: AC
  Filled 2017-01-12: qty 1

## 2017-01-12 NOTE — Patient Instructions (Signed)

## 2017-01-28 ENCOUNTER — Encounter: Payer: Self-pay | Admitting: Cardiology

## 2017-02-06 ENCOUNTER — Telehealth: Payer: Self-pay

## 2017-02-06 NOTE — Telephone Encounter (Signed)
Patient called and left message. She was having abdominal pain this morning and went to see primary MD. They ordered a CT scan and she asked that the results be sent to Dr. Alvy Bimler. No need to call her back unless this is going to change her appt for Monday for IVIG.

## 2017-02-06 NOTE — Telephone Encounter (Signed)
Called with below message. Verbalized understanding. 

## 2017-02-06 NOTE — Telephone Encounter (Signed)
pls proceed with CT Keep appointment on Monday

## 2017-02-09 ENCOUNTER — Ambulatory Visit (HOSPITAL_BASED_OUTPATIENT_CLINIC_OR_DEPARTMENT_OTHER): Payer: 59

## 2017-02-09 VITALS — BP 117/97 | HR 81 | Temp 98.4°F | Resp 18

## 2017-02-09 DIAGNOSIS — D801 Nonfamilial hypogammaglobulinemia: Secondary | ICD-10-CM | POA: Diagnosis not present

## 2017-02-09 MED ORDER — IMMUNE GLOBULIN (HUMAN) 10 GM/100ML IV SOLN
65.0000 g | Freq: Once | INTRAVENOUS | Status: AC
  Administered 2017-02-09: 65 g via INTRAVENOUS
  Filled 2017-02-09: qty 600

## 2017-02-09 MED ORDER — DIPHENHYDRAMINE HCL 25 MG PO CAPS
ORAL_CAPSULE | ORAL | Status: AC
  Filled 2017-02-09: qty 2

## 2017-02-09 MED ORDER — METHYLPREDNISOLONE SODIUM SUCC 40 MG IJ SOLR
20.0000 mg | INTRAMUSCULAR | Status: DC
  Administered 2017-02-09: 20 mg via INTRAVENOUS

## 2017-02-09 MED ORDER — ACETAMINOPHEN 325 MG PO TABS
650.0000 mg | ORAL_TABLET | Freq: Once | ORAL | Status: AC
  Administered 2017-02-09: 650 mg via ORAL

## 2017-02-09 MED ORDER — ACETAMINOPHEN 325 MG PO TABS
ORAL_TABLET | ORAL | Status: AC
  Filled 2017-02-09: qty 2

## 2017-02-09 MED ORDER — METHYLPREDNISOLONE SODIUM SUCC 40 MG IJ SOLR
INTRAMUSCULAR | Status: AC
  Filled 2017-02-09: qty 1

## 2017-02-09 MED ORDER — DIPHENHYDRAMINE HCL 25 MG PO TABS
50.0000 mg | ORAL_TABLET | Freq: Once | ORAL | Status: AC
  Administered 2017-02-09: 50 mg via ORAL
  Filled 2017-02-09: qty 2

## 2017-02-09 NOTE — Progress Notes (Unsigned)
Pt requested to run at the initial rate x 90 mins, 2nd increase x 30 mins and final rate increase for remainder for bag.

## 2017-02-09 NOTE — Patient Instructions (Signed)

## 2017-03-09 ENCOUNTER — Ambulatory Visit (HOSPITAL_BASED_OUTPATIENT_CLINIC_OR_DEPARTMENT_OTHER): Payer: 59

## 2017-03-09 VITALS — BP 137/67 | HR 87 | Temp 98.9°F | Resp 17

## 2017-03-09 DIAGNOSIS — D801 Nonfamilial hypogammaglobulinemia: Secondary | ICD-10-CM | POA: Diagnosis not present

## 2017-03-09 MED ORDER — METHYLPREDNISOLONE SODIUM SUCC 40 MG IJ SOLR
INTRAMUSCULAR | Status: AC
  Filled 2017-03-09: qty 1

## 2017-03-09 MED ORDER — DEXTROSE 5 % IV SOLN
INTRAVENOUS | Status: DC
  Administered 2017-03-09: 08:00:00 via INTRAVENOUS

## 2017-03-09 MED ORDER — IMMUNE GLOBULIN (HUMAN) 20 GM/200ML IV SOLN
80.0000 g | Freq: Once | INTRAVENOUS | Status: AC
  Administered 2017-03-09: 80 g via INTRAVENOUS
  Filled 2017-03-09: qty 800

## 2017-03-09 MED ORDER — METHYLPREDNISOLONE SODIUM SUCC 40 MG IJ SOLR
20.0000 mg | INTRAMUSCULAR | Status: DC
  Administered 2017-03-09: 20 mg via INTRAVENOUS

## 2017-03-09 MED ORDER — ACETAMINOPHEN 325 MG PO TABS
ORAL_TABLET | ORAL | Status: AC
  Filled 2017-03-09: qty 2

## 2017-03-09 MED ORDER — DIPHENHYDRAMINE HCL 25 MG PO TABS
50.0000 mg | ORAL_TABLET | Freq: Once | ORAL | Status: AC
  Administered 2017-03-09: 50 mg via ORAL
  Filled 2017-03-09: qty 2

## 2017-03-09 MED ORDER — DIPHENHYDRAMINE HCL 25 MG PO CAPS
ORAL_CAPSULE | ORAL | Status: AC
  Filled 2017-03-09: qty 2

## 2017-03-09 MED ORDER — ACETAMINOPHEN 325 MG PO TABS
650.0000 mg | ORAL_TABLET | Freq: Once | ORAL | Status: AC
  Administered 2017-03-09: 650 mg via ORAL

## 2017-03-09 NOTE — Patient Instructions (Signed)

## 2017-03-11 IMAGING — US US SOFT TISSUE HEAD/NECK
1 series · 14 of 25 positions shown · non-contrast
Comparison: 06/26/2014

CLINICAL DATA: Follow-up thyroid nodules.

EXAM:
THYROID ULTRASOUND
TECHNIQUE: Ultrasound examination of the thyroid gland and adjacent soft
tissues was performed.

[Series 1: us soft tissue head/neck · 0.06mm/px · 14 of 47 slices shown]
[im 1/47]
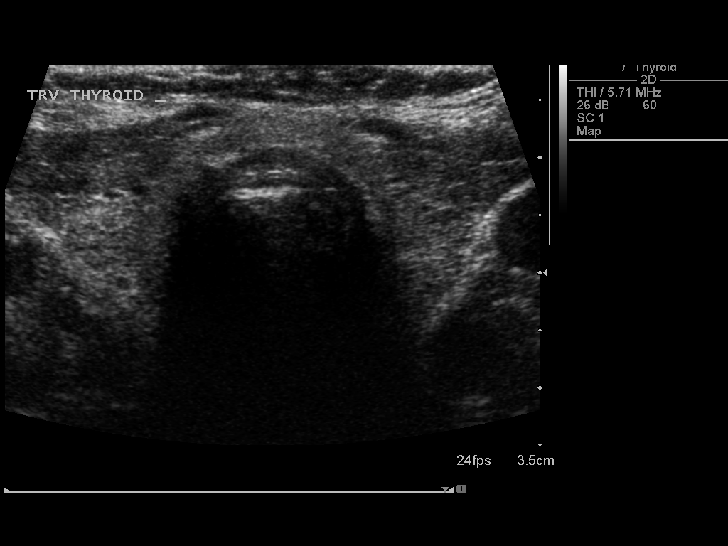
[im 4/47]
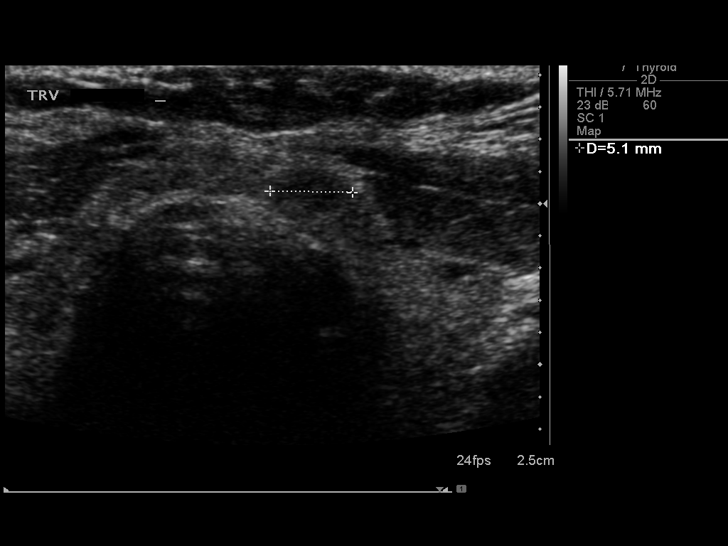
[im 8/47]
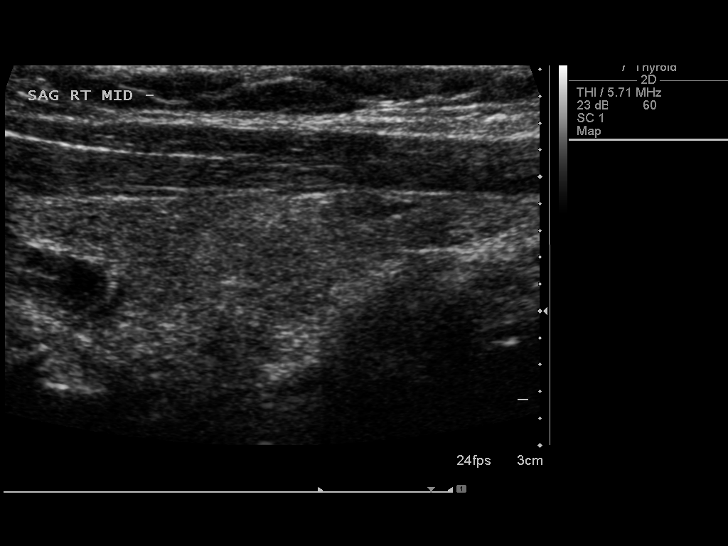
[im 12/47]
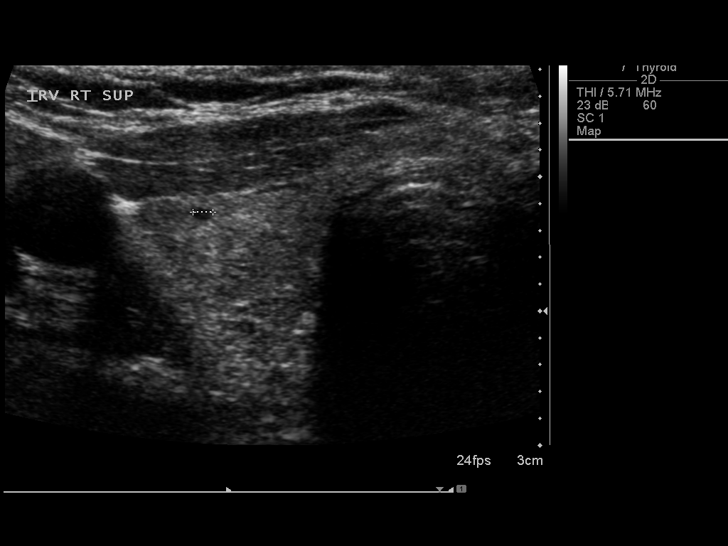
[im 16/47]
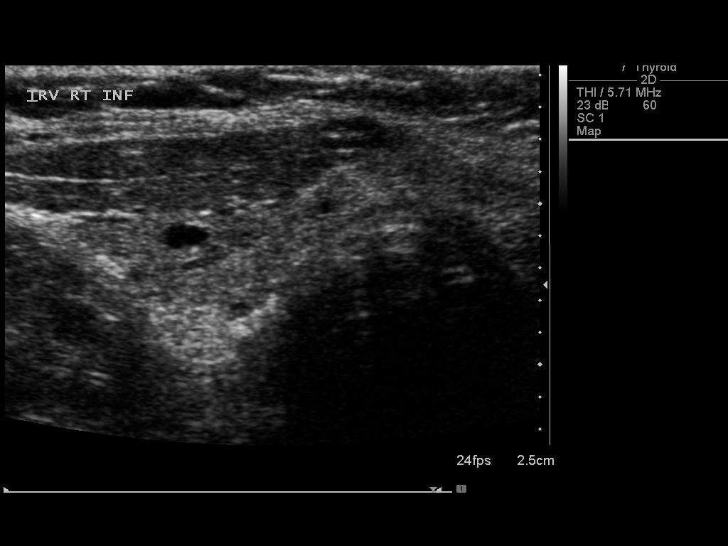
[im 18/47]
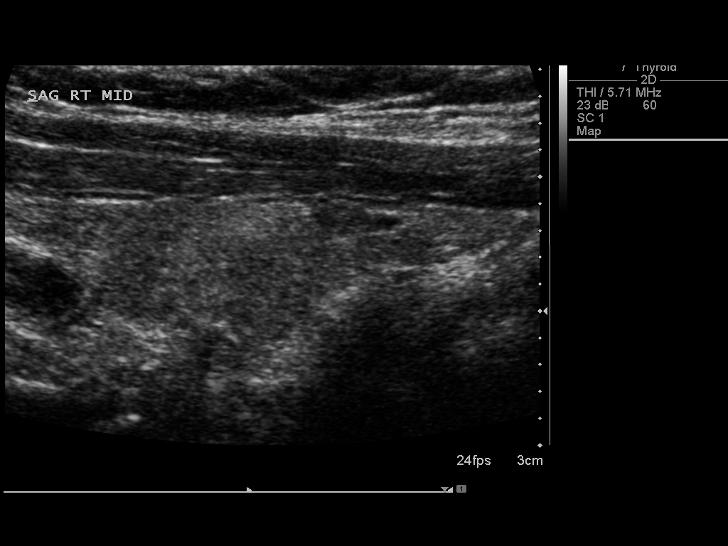
[im 22/47]
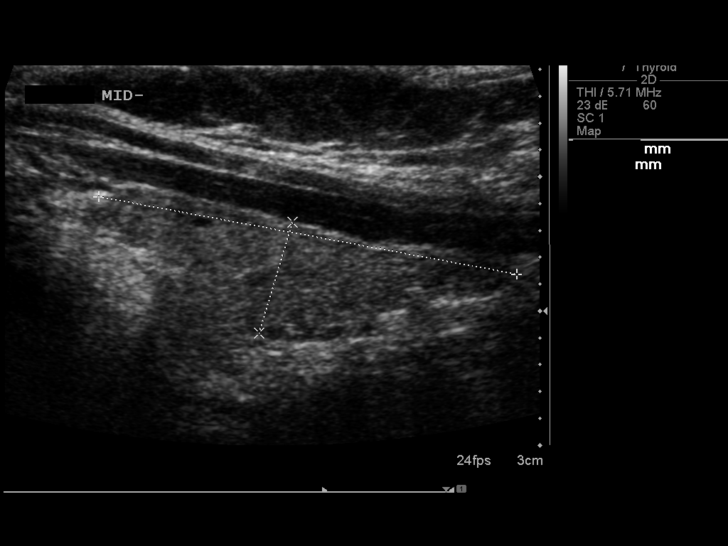
[im 25/47]
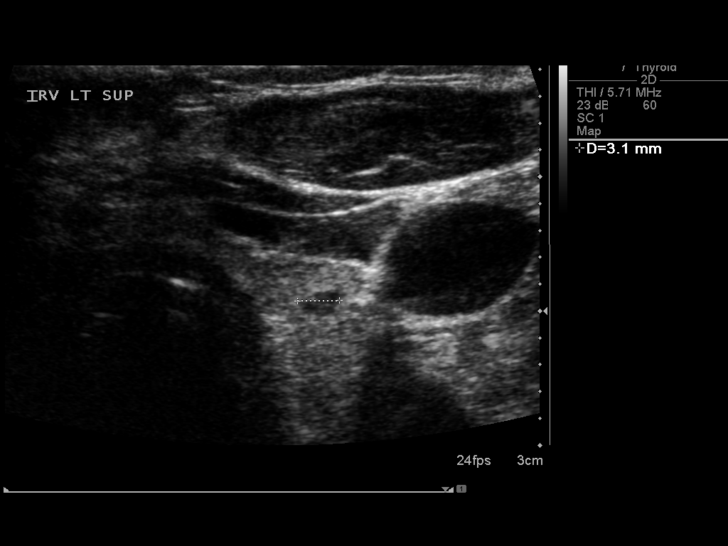
[im 29/47]
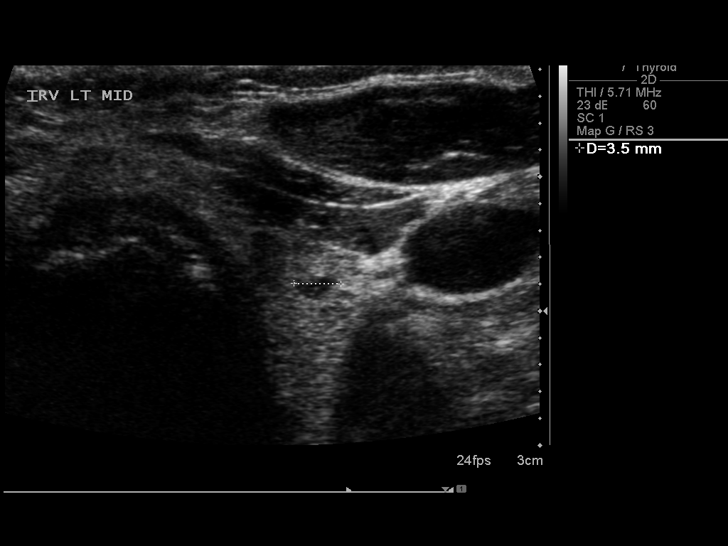
[im 31/47]
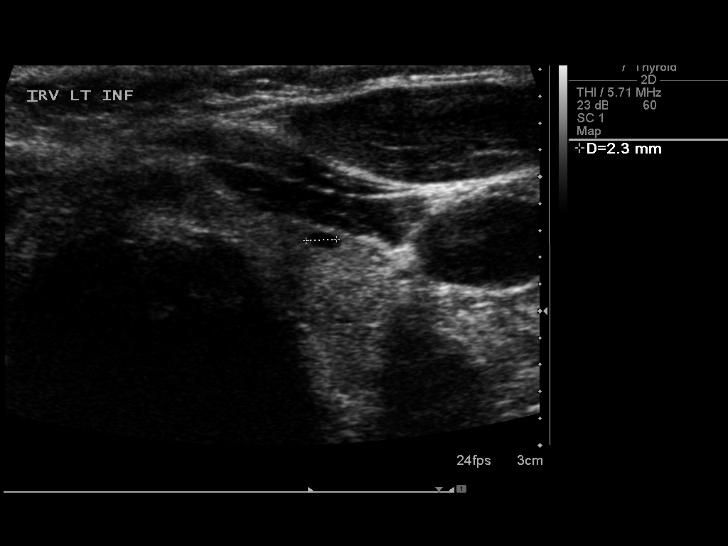
[im 35/47]
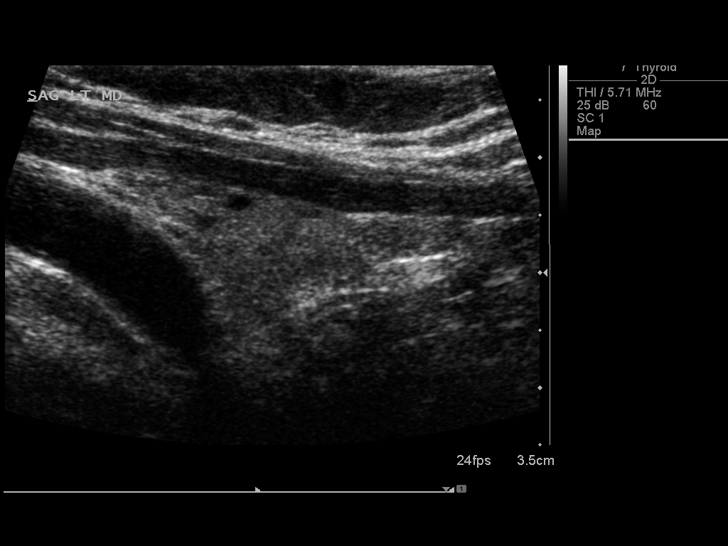
[im 39/47]
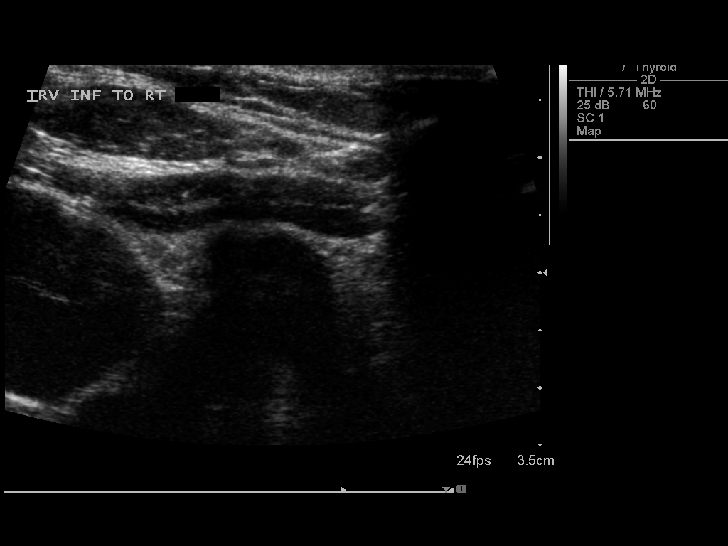
[im 43/47]
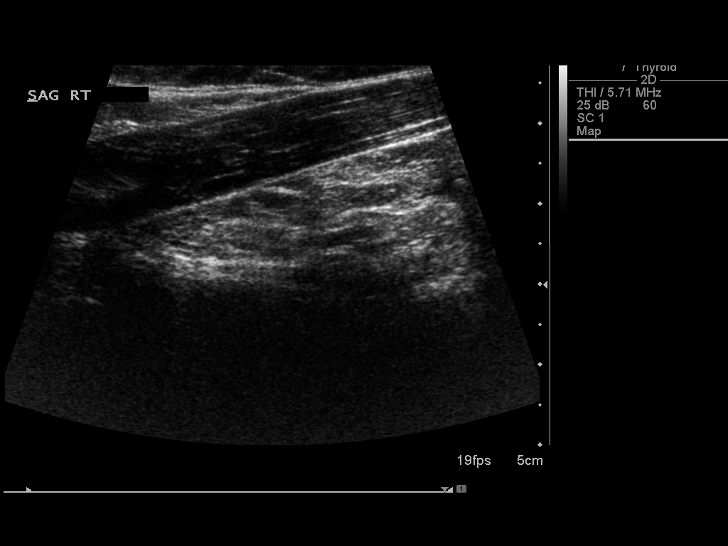
[im 47/47]
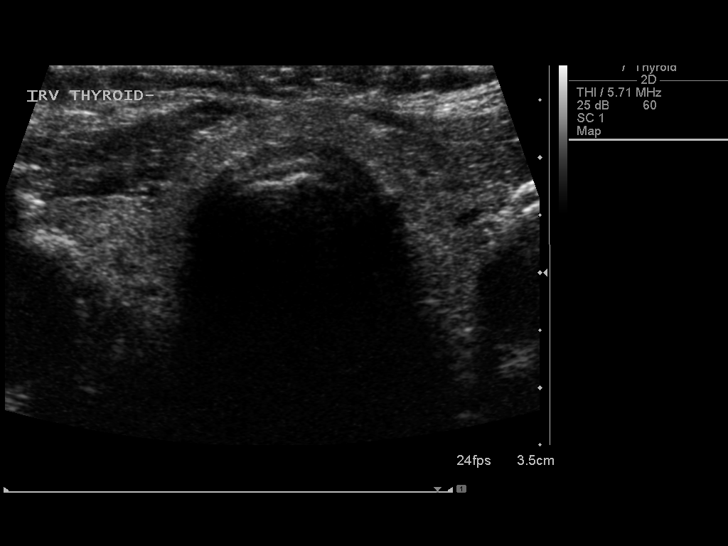

[14 of 25 positions shown; findings below may reference images not displayed]

FINDINGS: Right thyroid lobe

Measurements: 3.9 x 1.0 x 1.5 cm. Multiple small hypoechoic nodules
or cysts in the right thyroid lobe, largest measuring 0.3 cm.

Left thyroid lobe

Measurements: 3.2 x 0.9 x 1.2 cm. Multiple small hypoechoic and
heterogeneous nodules in left thyroid lobe, largest measuring
cm.

Isthmus

Thickness: 0.3 cm. Heterogeneous hypoechoic nodule along the left
side of the isthmus measures up to 0.5 cm and previously measured
0.9 cm.

Lymphadenopathy

None visualized.
IMPRESSION: Small bilateral nodules and cysts. The largest cystic nodule along
the left side of the isthmus has decreased in size.

## 2017-04-06 ENCOUNTER — Ambulatory Visit (HOSPITAL_BASED_OUTPATIENT_CLINIC_OR_DEPARTMENT_OTHER): Payer: 59

## 2017-04-06 VITALS — BP 116/80 | HR 87 | Temp 98.3°F | Resp 18

## 2017-04-06 DIAGNOSIS — D801 Nonfamilial hypogammaglobulinemia: Secondary | ICD-10-CM | POA: Diagnosis not present

## 2017-04-06 MED ORDER — IMMUNE GLOBULIN (HUMAN) 20 GM/200ML IV SOLN
80.0000 g | Freq: Once | INTRAVENOUS | Status: AC
  Administered 2017-04-06: 80 g via INTRAVENOUS
  Filled 2017-04-06: qty 800

## 2017-04-06 MED ORDER — DIPHENHYDRAMINE HCL 25 MG PO CAPS
ORAL_CAPSULE | ORAL | Status: AC
  Filled 2017-04-06: qty 2

## 2017-04-06 MED ORDER — METHYLPREDNISOLONE SODIUM SUCC 40 MG IJ SOLR
20.0000 mg | INTRAMUSCULAR | Status: DC
  Administered 2017-04-06: 20 mg via INTRAVENOUS

## 2017-04-06 MED ORDER — METHYLPREDNISOLONE SODIUM SUCC 40 MG IJ SOLR
INTRAMUSCULAR | Status: AC
  Filled 2017-04-06: qty 1

## 2017-04-06 MED ORDER — ACETAMINOPHEN 325 MG PO TABS
ORAL_TABLET | ORAL | Status: AC
  Filled 2017-04-06: qty 2

## 2017-04-06 MED ORDER — DIPHENHYDRAMINE HCL 25 MG PO TABS
50.0000 mg | ORAL_TABLET | Freq: Once | ORAL | Status: AC
  Administered 2017-04-06: 50 mg via ORAL
  Filled 2017-04-06: qty 2

## 2017-04-06 MED ORDER — ACETAMINOPHEN 325 MG PO TABS
650.0000 mg | ORAL_TABLET | Freq: Once | ORAL | Status: AC
  Administered 2017-04-06: 650 mg via ORAL

## 2017-04-06 NOTE — Patient Instructions (Signed)

## 2017-04-06 NOTE — Progress Notes (Signed)
Patient VSS. Patient does not want to stay for the 30 minute post IVIG observation.

## 2017-04-13 ENCOUNTER — Ambulatory Visit: Payer: 59

## 2017-05-04 ENCOUNTER — Ambulatory Visit (HOSPITAL_BASED_OUTPATIENT_CLINIC_OR_DEPARTMENT_OTHER): Payer: 59

## 2017-05-04 VITALS — BP 142/76 | HR 87 | Temp 98.1°F | Resp 16 | Wt 191.8 lb

## 2017-05-04 DIAGNOSIS — D801 Nonfamilial hypogammaglobulinemia: Secondary | ICD-10-CM

## 2017-05-04 MED ORDER — METHYLPREDNISOLONE SODIUM SUCC 40 MG IJ SOLR
INTRAMUSCULAR | Status: AC
  Filled 2017-05-04: qty 1

## 2017-05-04 MED ORDER — IMMUNE GLOBULIN (HUMAN) 20 GM/200ML IV SOLN
80.0000 g | Freq: Once | INTRAVENOUS | Status: AC
  Administered 2017-05-04: 80 g via INTRAVENOUS
  Filled 2017-05-04: qty 800

## 2017-05-04 MED ORDER — METHYLPREDNISOLONE SODIUM SUCC 40 MG IJ SOLR
20.0000 mg | INTRAMUSCULAR | Status: DC
Start: 2017-05-04 — End: 2017-05-04
  Administered 2017-05-04: 20 mg via INTRAVENOUS

## 2017-05-04 MED ORDER — DIPHENHYDRAMINE HCL 25 MG PO CAPS
ORAL_CAPSULE | ORAL | Status: AC
  Filled 2017-05-04: qty 2

## 2017-05-04 MED ORDER — DIPHENHYDRAMINE HCL 25 MG PO TABS
50.0000 mg | ORAL_TABLET | Freq: Once | ORAL | Status: AC
  Administered 2017-05-04: 50 mg via ORAL
  Filled 2017-05-04: qty 2

## 2017-05-04 MED ORDER — ACETAMINOPHEN 325 MG PO TABS
650.0000 mg | ORAL_TABLET | Freq: Once | ORAL | Status: AC
  Administered 2017-05-04: 650 mg via ORAL

## 2017-05-04 MED ORDER — ACETAMINOPHEN 325 MG PO TABS
ORAL_TABLET | ORAL | Status: AC
  Filled 2017-05-04: qty 2

## 2017-05-04 NOTE — Patient Instructions (Signed)

## 2017-05-04 NOTE — Progress Notes (Signed)
Patient refused to stay for 51min post-observation. Stable at time of discharge. VSS. Discharge instructions provided.  Wylene Simmer, BSN, RN

## 2017-05-11 ENCOUNTER — Ambulatory Visit: Payer: 59

## 2017-05-13 ENCOUNTER — Encounter: Payer: Self-pay | Admitting: Hematology and Oncology

## 2017-05-13 ENCOUNTER — Other Ambulatory Visit: Payer: Self-pay | Admitting: Hematology and Oncology

## 2017-05-13 DIAGNOSIS — Z1231 Encounter for screening mammogram for malignant neoplasm of breast: Secondary | ICD-10-CM

## 2017-05-21 ENCOUNTER — Encounter: Payer: Self-pay | Admitting: Gastroenterology

## 2017-05-29 ENCOUNTER — Other Ambulatory Visit: Payer: Self-pay | Admitting: Hematology and Oncology

## 2017-05-29 DIAGNOSIS — C9111 Chronic lymphocytic leukemia of B-cell type in remission: Secondary | ICD-10-CM

## 2017-05-29 DIAGNOSIS — D801 Nonfamilial hypogammaglobulinemia: Secondary | ICD-10-CM

## 2017-06-01 ENCOUNTER — Telehealth: Payer: Self-pay | Admitting: Hematology and Oncology

## 2017-06-01 ENCOUNTER — Encounter: Payer: Self-pay | Admitting: Hematology and Oncology

## 2017-06-01 ENCOUNTER — Other Ambulatory Visit (HOSPITAL_BASED_OUTPATIENT_CLINIC_OR_DEPARTMENT_OTHER): Payer: 59

## 2017-06-01 ENCOUNTER — Ambulatory Visit: Payer: 59

## 2017-06-01 ENCOUNTER — Ambulatory Visit (HOSPITAL_BASED_OUTPATIENT_CLINIC_OR_DEPARTMENT_OTHER): Payer: 59

## 2017-06-01 ENCOUNTER — Ambulatory Visit (HOSPITAL_BASED_OUTPATIENT_CLINIC_OR_DEPARTMENT_OTHER): Payer: 59 | Admitting: Hematology and Oncology

## 2017-06-01 VITALS — BP 122/57 | HR 77 | Temp 98.6°F | Resp 17

## 2017-06-01 DIAGNOSIS — C9111 Chronic lymphocytic leukemia of B-cell type in remission: Secondary | ICD-10-CM

## 2017-06-01 DIAGNOSIS — K5792 Diverticulitis of intestine, part unspecified, without perforation or abscess without bleeding: Secondary | ICD-10-CM | POA: Insufficient documentation

## 2017-06-01 DIAGNOSIS — D801 Nonfamilial hypogammaglobulinemia: Secondary | ICD-10-CM

## 2017-06-01 LAB — COMPREHENSIVE METABOLIC PANEL
ALBUMIN: 3.9 g/dL (ref 3.5–5.0)
ALK PHOS: 52 U/L (ref 40–150)
ALT: 28 U/L (ref 0–55)
ANION GAP: 12 meq/L — AB (ref 3–11)
AST: 35 U/L — AB (ref 5–34)
BILIRUBIN TOTAL: 0.58 mg/dL (ref 0.20–1.20)
BUN: 14.5 mg/dL (ref 7.0–26.0)
CALCIUM: 10.1 mg/dL (ref 8.4–10.4)
CO2: 21 meq/L — AB (ref 22–29)
CREATININE: 1 mg/dL (ref 0.6–1.1)
Chloride: 110 mEq/L — ABNORMAL HIGH (ref 98–109)
EGFR: 56 mL/min/{1.73_m2} — AB (ref >90)
GLUCOSE: 88 mg/dL (ref 70–140)
POTASSIUM: 4.2 meq/L (ref 3.5–5.1)
Sodium: 142 mEq/L (ref 136–145)
Total Protein: 7.7 g/dL (ref 6.4–8.3)

## 2017-06-01 LAB — CBC WITH DIFFERENTIAL/PLATELET
BASO%: 0.2 % (ref 0.0–2.0)
BASOS ABS: 0 10*3/uL (ref 0.0–0.1)
EOS ABS: 0.1 10*3/uL (ref 0.0–0.5)
EOS%: 1 % (ref 0.0–7.0)
HEMATOCRIT: 43.4 % (ref 34.8–46.6)
HEMOGLOBIN: 14.5 g/dL (ref 11.6–15.9)
LYMPH#: 2.4 10*3/uL (ref 0.9–3.3)
LYMPH%: 39.5 % (ref 14.0–49.7)
MCH: 29.1 pg (ref 25.1–34.0)
MCHC: 33.3 g/dL (ref 31.5–36.0)
MCV: 87.5 fL (ref 79.5–101.0)
MONO#: 0.5 10*3/uL (ref 0.1–0.9)
MONO%: 8.4 % (ref 0.0–14.0)
NEUT#: 3.1 10*3/uL (ref 1.5–6.5)
NEUT%: 50.9 % (ref 38.4–76.8)
PLATELETS: 166 10*3/uL (ref 145–400)
RBC: 4.96 10*6/uL (ref 3.70–5.45)
RDW: 14.2 % (ref 11.2–14.5)
WBC: 6 10*3/uL (ref 3.9–10.3)

## 2017-06-01 MED ORDER — METHYLPREDNISOLONE SODIUM SUCC 40 MG IJ SOLR
20.0000 mg | INTRAMUSCULAR | Status: DC
  Administered 2017-06-01: 20 mg via INTRAVENOUS

## 2017-06-01 MED ORDER — DIPHENHYDRAMINE HCL 25 MG PO CAPS
ORAL_CAPSULE | ORAL | Status: AC
  Filled 2017-06-01: qty 2

## 2017-06-01 MED ORDER — DIPHENHYDRAMINE HCL 25 MG PO TABS
50.0000 mg | ORAL_TABLET | Freq: Once | ORAL | Status: AC
  Administered 2017-06-01: 50 mg via ORAL
  Filled 2017-06-01: qty 2

## 2017-06-01 MED ORDER — IMMUNE GLOBULIN (HUMAN) 10 GM/100ML IV SOLN
1.0000 g/kg | Freq: Once | INTRAVENOUS | Status: AC
  Administered 2017-06-01: 90 g via INTRAVENOUS
  Filled 2017-06-01: qty 800

## 2017-06-01 MED ORDER — ACETAMINOPHEN 325 MG PO TABS
ORAL_TABLET | ORAL | Status: AC
  Filled 2017-06-01: qty 2

## 2017-06-01 MED ORDER — METHYLPREDNISOLONE SODIUM SUCC 40 MG IJ SOLR
INTRAMUSCULAR | Status: AC
  Filled 2017-06-01: qty 1

## 2017-06-01 MED ORDER — DEXTROSE-NACL 5-0.45 % IV SOLN
Freq: Once | INTRAVENOUS | Status: DC
Start: 2017-06-01 — End: 2017-06-01

## 2017-06-01 MED ORDER — DEXTROSE 5 % IV SOLN
INTRAVENOUS | Status: DC
  Administered 2017-06-01: 09:00:00 via INTRAVENOUS

## 2017-06-01 MED ORDER — ACETAMINOPHEN 325 MG PO TABS
650.0000 mg | ORAL_TABLET | Freq: Once | ORAL | Status: AC
  Administered 2017-06-01: 650 mg via ORAL

## 2017-06-01 NOTE — Assessment & Plan Note (Signed)
Clinically, she remained in remission. Continues annual follow-up with history, physical examination and blood work.

## 2017-06-01 NOTE — Patient Instructions (Signed)

## 2017-06-01 NOTE — Assessment & Plan Note (Signed)
The patient had diverticulitis not long ago She has been referred to GI for further follow-up I told her that her diverticulitis is likely unrelated to her ongoing underlying hematology process I would defer to them for further management

## 2017-06-01 NOTE — Telephone Encounter (Signed)
Appointments complete per 10/8 los. Patient to get print out in infusion area.

## 2017-06-01 NOTE — Assessment & Plan Note (Signed)
This is related to long-term side effects from prior chemotherapy. She has recurrent infection with herpesvirus, bronchitis, tooth abscess and skin infections in the past She is already on chronic treatment with Valtrex daily. She is doing well and will continue IVIG treatment at 1 g/kg every [redacted] weeks along with premedication with Benadryl, Tylenol and Solu-Medrol. The risk, benefit, side effects of IVIG is fully discussed with the patient and she agreed to proceed. She has minor serum sickness with Octagam and Privigen. According to the patient, she does better with slower infusion. We will budget 8 hours infusion for her each time

## 2017-06-01 NOTE — Progress Notes (Signed)
[place] OFFICE PROGRESS NOTE  Patient Care Team: Tisovec, Fransico Him, MD as PCP - General (Internal Medicine) Deneise Lever, MD as Referring Physician (Pulmonary Disease) Rozetta Nunnery, MD as Referring Physician (Otolaryngology) Milus Banister, MD (Gastroenterology) Arvella Nigh, MD (Obstetrics and Gynecology) Heath Lark, MD as Consulting Physician (Hematology and Oncology) Jari Pigg, MD as Consulting Physician (Dermatology) Marygrace Drought, MD as Consulting Physician (Ophthalmology)  SUMMARY OF ONCOLOGIC HISTORY:  She was in one of the first cohorts of patients treated with FCR on protocol at the [facility] at time of her initial diagnosis in July 2002. Technically she had RAI  stage III disease due to anemia but she had no organomegaly or lymphadenopathy on exam. FCR was started in 12/2001, and continued through 06/2002. She achieved a complete hematologic response and,which has been durable now for over 11 years!  She did develop hypogammaglobulinemia. As a result, she gets recurrent bronchitis and sinusitis. In April 2013 she was started her on monthly intravenous immunoglobulin 400 mg per kilogram and this has significantly impacted on the frequency and severity of her infections. This was continued through April of 2014. She has had no new infections since stopping the IVIG but as expected, immunoglobulin levels have fallen to pretreatment levels.   She has a history of her recurrent squamous cell carcinomas of her skin and has had 2 lesions excised from her left tibial area and another 1 excised from the vulvar area. Recently, she had a basal cell carcinoma removed. She has a history of both herpes zoster and herpes simplex infections. Recently, she had abnormal Pap smear and currently on close observation. Recent colposcopy was negative.  She received IVIG treatment for acquired hypogammaglobulinemia but stopped in April 2014 due to sick  serum syndrome In July 2016, IVIG is resumed with premedication In August 2016, she developed cellulitis from venipuncture, resolved with antibiotics  INTERVAL HISTORY: Please see below for problem oriented charting. Since the last time I saw her, she denies further serum sickness with slow infusion Since the last time I saw her, she had one episode of tooth sensitivity but not dental abscess.  She did not require antibiotic then She had one episode of diverticulitis around June, resolved with antibiotics but she felt that her bowel habits are not back to normal She denies recent flulike illness or pneumonia No recent recurrent outbreak with viral infection such as shingles The patient denies any recent signs or symptoms of bleeding such as spontaneous epistaxis, hematuria or hematochezia No new lymphadenopathy.  She continues to battle with intermittent hip pain, chronic in nature  REVIEW OF SYSTEMS:   Constitutional: Denies fevers, chills or abnormal weight loss Eyes: Denies blurriness of vision Ears, nose, mouth, throat, and face: Denies mucositis or sore throat Respiratory: Denies cough, dyspnea or wheezes Cardiovascular: Denies palpitation, chest discomfort or lower extremity swelling Gastrointestinal:  Denies nausea, heartburn or change in bowel habits Skin: Denies abnormal skin rashes Lymphatics: Denies new lymphadenopathy or easy bruising Neurological:Denies numbness, tingling or new weaknesses Behavioral/Psych: Mood is stable, no new changes  All other systems were reviewed with the patient and are negative.  I have reviewed the past medical history, past surgical history, social history and family history with the patient and they are unchanged from previous note.  ALLERGIES:  is allergic to penicillins; pentamidine; and sulfonamide derivatives.  MEDICATIONS:  Current Outpatient Prescriptions  Medication Sig Dispense Refill  . Calcium Carbonate-Vitamin D (CALTRATE 600+D)  600-400 MG-UNIT  per tablet Take 1 tablet by mouth daily.    Marland Kitchen doxycycline (VIBRA-TABS) 100 MG tablet Take 1 tablet (100 mg total) by mouth 2 (two) times daily. (Patient not taking: Reported on 12/15/2016) 14 tablet 0  . fexofenadine (ALLEGRA) 180 MG tablet Take 180 mg by mouth daily.      Marland Kitchen guaiFENesin (MUCINEX) 600 MG 12 hr tablet Take by mouth daily as needed. Reported on 08/24/2015    . levothyroxine (SYNTHROID, LEVOTHROID) 50 MCG tablet Take 50 mcg by mouth daily before breakfast.     . Multiple Vitamin (MULTIVITAMIN) tablet Take 1 tablet by mouth daily.    . mupirocin ointment (BACTROBAN) 2 % Apply 1 application topically as needed.     . valACYclovir (VALTREX) 500 MG tablet Take 500 mg by mouth as needed.     No current facility-administered medications for this visit.    Facility-Administered Medications Ordered in Other Visits  Medication Dose Route Frequency Provider Last Rate Last Dose  . methylPREDNISolone sodium succinate (SOLU-MEDROL) 40 mg/mL injection 20 mg  20 mg Intravenous Q30 days Heath Lark, MD   20 mg at 02/09/17 0920    PHYSICAL EXAMINATION: ECOG PERFORMANCE STATUS: 1 - Symptomatic but completely ambulatory  Vitals:   06/01/17 0820  BP: (!) 149/55  Pulse: 94  Resp: 20  Temp: 98.5 F (36.9 C)  SpO2: 96%   Filed Weights   06/01/17 0820  Weight: 194 lb (88 kg)    GENERAL:alert, no distress and comfortable SKIN: skin color, texture, turgor are normal, no rashes or significant lesions EYES: normal, Conjunctiva are pink and non-injected, sclera clear OROPHARYNX:no exudate, no erythema and lips, buccal mucosa, and tongue normal  NECK: supple, thyroid normal size, non-tender, without nodularity LYMPH:  no palpable lymphadenopathy in the cervical, axillary or inguinal LUNGS: clear to auscultation and percussion with normal breathing effort HEART: regular rate & rhythm and no murmurs and no lower extremity edema ABDOMEN:abdomen soft, non-tender and normal bowel  sounds Musculoskeletal:no cyanosis of digits and no clubbing  NEURO: alert & oriented x 3 with fluent speech, no focal motor/sensory deficits  LABORATORY DATA:  I have reviewed the data as listed    Component Value Date/Time   NA 142 06/01/2017 0756   K 4.2 06/01/2017 0756   CL 104 05/22/2014 0737   CL 109 (H) 01/20/2013 0801   CO2 21 (L) 06/01/2017 0756   GLUCOSE 88 06/01/2017 0756   GLUCOSE 89 01/20/2013 0801   BUN 14.5 06/01/2017 0756   CREATININE 1.0 06/01/2017 0756   CALCIUM 10.1 06/01/2017 0756   PROT 7.7 06/01/2017 0756   ALBUMIN 3.9 06/01/2017 0756   AST 35 (H) 06/01/2017 0756   ALT 28 06/01/2017 0756   ALKPHOS 52 06/01/2017 0756   BILITOT 0.58 06/01/2017 0756   GFRNONAA 57 (L) 04/18/2009 0943   GFRAA  04/18/2009 0943    >60        The eGFR has been calculated using the MDRD equation. This calculation has not been validated in all clinical situations. eGFR's persistently <60 mL/min signify possible Chronic Kidney Disease.    No results found for: SPEP, UPEP  Lab Results  Component Value Date   WBC 6.0 06/01/2017   NEUTROABS 3.1 06/01/2017   HGB 14.5 06/01/2017   HCT 43.4 06/01/2017   MCV 87.5 06/01/2017   PLT 166 06/01/2017      Chemistry      Component Value Date/Time   NA 142 06/01/2017 0756   K 4.2 06/01/2017 0756  CL 104 05/22/2014 0737   CL 109 (H) 01/20/2013 0801   CO2 21 (L) 06/01/2017 0756   BUN 14.5 06/01/2017 0756   CREATININE 1.0 06/01/2017 0756   GLU 92 06/17/2013      Component Value Date/Time   CALCIUM 10.1 06/01/2017 0756   ALKPHOS 52 06/01/2017 0756   AST 35 (H) 06/01/2017 0756   ALT 28 06/01/2017 0756   BILITOT 0.58 06/01/2017 0756      ASSESSMENT & PLAN:  Chronic lymphoid leukemia in remission Clinically, she remained in remission. Continues annual follow-up with history, physical examination and blood work.   Hypogammaglobulinemia, acquired This is related to long-term side effects from prior chemotherapy. She  has recurrent infection with herpesvirus, bronchitis, tooth abscess and skin infections in the past She is already on chronic treatment with Valtrex daily. She is doing well and will continue IVIG treatment at 1 g/kg every [redacted] weeks along with premedication with Benadryl, Tylenol and Solu-Medrol. The risk, benefit, side effects of IVIG is fully discussed with the patient and she agreed to proceed. She has minor serum sickness with Octagam and Privigen. According to the patient, she does better with slower infusion. We will budget 8 hours infusion for her each time  Diverticulitis The patient had diverticulitis not long ago She has been referred to GI for further follow-up I told her that her diverticulitis is likely unrelated to her ongoing underlying hematology process I would defer to them for further management   No orders of the defined types were placed in this encounter.  All questions were answered. The patient knows to call the clinic with any problems, questions or concerns. No barriers to learning was detected. I spent 15 minutes counseling the patient face to face. The total time spent in the appointment was 20 minutes and more than 50% was on counseling and review of test results     Heath Lark, MD 06/01/2017 9:04 AM

## 2017-06-02 LAB — IGG, IGA, IGM
IGM (IMMUNOGLOBIN M), SRM: 7 mg/dL — AB (ref 26–217)
IgG, Qn, Serum: 1341 mg/dL (ref 700–1600)

## 2017-06-15 ENCOUNTER — Ambulatory Visit: Payer: 59 | Admitting: Hematology and Oncology

## 2017-06-15 ENCOUNTER — Ambulatory Visit: Payer: 59

## 2017-06-15 ENCOUNTER — Other Ambulatory Visit: Payer: 59

## 2017-06-24 ENCOUNTER — Encounter: Payer: Self-pay | Admitting: Nurse Practitioner

## 2017-06-24 ENCOUNTER — Ambulatory Visit (INDEPENDENT_AMBULATORY_CARE_PROVIDER_SITE_OTHER): Payer: 59 | Admitting: Nurse Practitioner

## 2017-06-24 ENCOUNTER — Ambulatory Visit
Admission: RE | Admit: 2017-06-24 | Discharge: 2017-06-24 | Disposition: A | Discharge status: Home / Self Care | Payer: 59 | Source: Ambulatory Visit | Attending: Hematology and Oncology | Admitting: Hematology and Oncology

## 2017-06-24 VITALS — BP 138/88 | HR 96 | Ht 63.0 in | Wt 193.0 lb

## 2017-06-24 DIAGNOSIS — K219 Gastro-esophageal reflux disease without esophagitis: Secondary | ICD-10-CM | POA: Diagnosis not present

## 2017-06-24 DIAGNOSIS — Z800 Family history of malignant neoplasm of digestive organs: Secondary | ICD-10-CM

## 2017-06-24 DIAGNOSIS — Z1231 Encounter for screening mammogram for malignant neoplasm of breast: Secondary | ICD-10-CM

## 2017-06-24 NOTE — Patient Instructions (Signed)
If you are age 70 or older, your body mass index should be between 23-30. Your Body mass index is 34.19 kg/m. If this is out of the aforementioned range listed, please consider follow up with your Primary Care Provider.  If you are age 24 or younger, your body mass index should be between 19-25. Your Body mass index is 34.19 kg/m. If this is out of the aformentioned range listed, please consider follow up with your Primary Care Provider.   Increase Zantac to 300 mg at bedtime.  Will let you know when next colonoscopy is.  Thank you for choosing me and [facility].   Tye Savoy, NP

## 2017-06-24 NOTE — Progress Notes (Signed)
HPI: Patient is a 71 year old female known to Dr. Ardis Parker, not seen since 2013.   Patient has a history of CLL 16 years ago, treated at [facility].  She has an acquired immune deficiency from chemotherapy for which she gets IVIG infusions every 2 months.   Patient referred by PCP Dr. Osborne Parker for recent diverticulitis. Patient thought she had a UTI in June, saw PCP but turned out she had diverticulitis documented by CT scan. She had been eating a lot of grains at the time. Prescribed bland diet, flagyl and cipro. The antibiotics caused diarrhea but she took imodium and was able to complete treatment. The abdominal pain has totally resolved. Her last colonoscopy was July 2013 with findings of moderate diverticulosis. A hyperplastic polyp was removed. Random biopsies for diarrhea were normal. Follow up colonoscopy was advised at 10 years. Mother diagnosed and died of colon cancer in her 7's.   Bianca Parker has been getting IVIG infusions for a couple of years. She developed heartburn,  Reflux, and night time coughing after the infusions and was started on Zantac a year ago. For the first 30 minutes after taking Zantac her heartburn gets worse then subsides. Lately she is having GERD sx everyday, not just on infusion days. Symptoms mainly at night. She sleeps with the [facility] and / or on a wedge pillow. Goes to bed on an empty stomach. No dysphagia.    Past Medical History:  Diagnosis Date  . Allergic rhinitis, cause unspecified   . Anemia   . CARCINOMA, SKIN, SQUAMOUS CELL   . Chronic lymphoid leukemia in remission [facility]) 2002 dx, tx 2003   Diagnosed in July 2002. Started on clinical trial at [facility] in June 2003. Now she is reportedly in remission but has a very compromised immune system. She is intermittently been doing IVIG and then was restarted back in July 2016. Now she doesn't have a month.   . Dyslipidemia   . Hepatitis A antibody positive 04/06/2012   Acute diarrheal illness &  elevated liver function tests 02/18/12;   . Hyperlipidemia   . Hypogammaglobulinemia, acquired ([place]) 11/18/2011   Due to CLL and prior chemo  . Rectal bleeding 07/24/2015     Past Surgical History:  Procedure Laterality Date  . ABDOMINAL HYSTERECTOMY  1966  . BUNIONECTOMY    . SIGMOIDOSCOPY    . SKIN BIOPSY    . TONSILLECTOMY     Family History  Problem Relation Age of Onset  . Colon cancer Mother   . Cancer Mother        colon  . Cancer Maternal Grandmother        ovarian  . Cancer Paternal Grandmother        breast  . Breast cancer Paternal Grandmother   . Breast cancer Other   . Ovarian cancer Other   . Breast cancer Paternal Aunt    Social History  Substance Use Topics  . Smoking status: Never Smoker  . Smokeless tobacco: Never Used     Comment: Married, lives with husband Bianca Parker), grown children. 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 Alcohol use Yes     Comment: Rare   Current Outpatient Prescriptions  Medication Sig Dispense Refill  . Calcium Carbonate-Vitamin D (CALTRATE 600+D) 600-400 MG-UNIT per tablet Take 1 tablet by mouth daily.    . fexofenadine (ALLEGRA) 180 MG tablet Take 180 mg by mouth daily.      Marland Kitchen guaiFENesin (MUCINEX) 600 MG 12  hr tablet Take by mouth daily as needed. Reported on 08/24/2015    . levothyroxine (SYNTHROID, LEVOTHROID) 50 MCG tablet Take 50 mcg by mouth daily before breakfast.     . Multiple Vitamin (MULTIVITAMIN) tablet Take 1 tablet by mouth daily.    . mupirocin ointment (BACTROBAN) 2 % Apply 1 application topically as needed.     . valACYclovir (VALTREX) 500 MG tablet Take 500 mg by mouth as needed.     No current facility-administered medications for this visit.    Facility-Administered Medications Ordered in Other Visits  Medication Dose Route Frequency Provider Last Rate Last Dose  . methylPREDNISolone sodium succinate (SOLU-MEDROL) 40 mg/mL injection 20 mg  20 mg Intravenous Q30 days Bianca Lark, MD   20 mg at 02/09/17  0920   Allergies  Allergen Reactions  . Penicillins   . Pentamidine   . Sulfonamide Derivatives      Review of Systems: All systems reviewed and negative except where noted in HPI.    Physical Exam: BP 138/88   Pulse 96   Ht 5\' 3"  (1.6 m)   Wt 193 lb (87.5 kg)   BMI 34.19 kg/m  Constitutional:  Well-developed, white female in no acute distress. Psychiatric: Normal mood and affect. Behavior is normal. EENT: Pupils normal.  Conjunctivae are normal. No scleral icterus. Neck supple.  Cardiovascular: Normal rate, regular rhythm. No edema Pulmonary/chest: Effort normal and breath sounds normal. No wheezing, rales or rhonchi. Abdominal: Soft, nondistended. Nontender. Bowel sounds active throughout. There are no masses palpable. No hepatomegaly. Lymphadenopathy: No cervical adenopathy noted. Neurological: Alert and oriented to person place and time. Skin: Skin is warm and dry. No rashes noted.   ASSESSMENT AND PLAN:  1. 71 yo female with worsening GERD symptoms manifested by night time pyrosis, reflux and cough. Initially just occurred on IVIG infusion days, now daily despite anti-reflux measures.  -GERD literature given. Continue anti-reflux measures ( elevated HOB, wedge, bed on empty stomach) -She would rather not take a PPI. Will increase Zantac to 300mg  at night.  -she will let us know how dosage increase works. If symptoms refractory to increased dose and anti-reflux measures then consider EGD.   2. Hx. of diverticulitis in June, 1st episode. Responded to treatment. Will slowly start incorporating grains back into her diet as she has kept herself on bland foods.   3. [facility] of colon cancer in mother. Patient advised to have 10 year follow up colonoscopy at time of her last one in 2013. She is concerned about [facility] of colon cancer in mother in he 2's. Given Mother's advanced age at time of diagnosis I think patient still considered average risk.   Bianca Savoy, NP  06/24/2017,  9:57 AM  Cc: Tisovec, Bianca Him, MD

## 2017-06-25 NOTE — Progress Notes (Signed)
Bianca Parker, I agree her FH does not signficantly increase her risk for colon cancer.  I do think it would be reasonable to repeat colonoscopy now given the clinical change (new diverticulitis) and the chance (albeit small) of a colon cancer mimicking diveriticulitis.  Can you contact her and let her know that after further thought I'd like to go ahead with colonoscopy at her soonest convenience?  Thanks

## 2017-06-26 ENCOUNTER — Telehealth: Payer: Self-pay

## 2017-06-26 NOTE — Telephone Encounter (Signed)
I contacted the patient. She requests to wait until April to schedule her colonoscopy. She would prefer not to come in during flu season.

## 2017-06-26 NOTE — Telephone Encounter (Signed)
Okay, thanks.  Can you please put her in for recall colonoscopy April 2019.

## 2017-06-26 NOTE — Telephone Encounter (Signed)
Patient is aware. Recall entered into EPIC

## 2017-06-26 NOTE — Telephone Encounter (Signed)
-----   Message from Willia Craze, NP sent at 06/25/2017  1:41 PM EDT ----- Bianca Parker, please let patient know that I spoke to Dr. Ardis Hughs as I told her that I would regarding.  He would like to proceed a colonoscopy thanks. Can you schedule this please. Thanks

## 2017-06-29 ENCOUNTER — Inpatient Hospital Stay: Payer: Medicare Other | Attending: Hematology and Oncology

## 2017-06-29 VITALS — BP 145/86 | HR 88 | Temp 98.3°F | Resp 18

## 2017-06-29 DIAGNOSIS — D801 Nonfamilial hypogammaglobulinemia: Secondary | ICD-10-CM | POA: Diagnosis not present

## 2017-06-29 MED ORDER — IMMUNE GLOBULIN (HUMAN) 10 GM/100ML IV SOLN
1.0000 g/kg | Freq: Once | INTRAVENOUS | Status: AC
  Administered 2017-06-29: 90 g via INTRAVENOUS
  Filled 2017-06-29: qty 800

## 2017-06-29 MED ORDER — DIPHENHYDRAMINE HCL 25 MG PO TABS
50.0000 mg | ORAL_TABLET | Freq: Once | ORAL | Status: AC
  Administered 2017-06-29: 50 mg via ORAL
  Filled 2017-06-29: qty 2

## 2017-06-29 MED ORDER — METHYLPREDNISOLONE SODIUM SUCC 40 MG IJ SOLR
20.0000 mg | INTRAMUSCULAR | Status: DC
  Administered 2017-06-29: 20 mg via INTRAVENOUS

## 2017-06-29 MED ORDER — METHYLPREDNISOLONE SODIUM SUCC 40 MG IJ SOLR
INTRAMUSCULAR | Status: AC
  Filled 2017-06-29: qty 1

## 2017-06-29 MED ORDER — ACETAMINOPHEN 325 MG PO TABS
650.0000 mg | ORAL_TABLET | Freq: Once | ORAL | Status: AC
  Administered 2017-06-29: 650 mg via ORAL

## 2017-06-29 MED ORDER — DIPHENHYDRAMINE HCL 25 MG PO CAPS
ORAL_CAPSULE | ORAL | Status: AC
  Filled 2017-06-29: qty 2

## 2017-06-29 MED ORDER — ACETAMINOPHEN 325 MG PO TABS
ORAL_TABLET | ORAL | Status: AC
Start: 2017-06-29 — End: 2017-06-29
  Filled 2017-06-29: qty 2

## 2017-06-29 MED ORDER — DEXTROSE 5 % IV SOLN
Freq: Once | INTRAVENOUS | Status: AC
  Administered 2017-06-29: 09:00:00 via INTRAVENOUS

## 2017-06-29 NOTE — Patient Instructions (Signed)

## 2017-06-29 NOTE — Progress Notes (Signed)
Patient refused to stay for full 30 minute observation. Patient and vital signs stable upon discharge.

## 2017-07-07 ENCOUNTER — Other Ambulatory Visit: Payer: Self-pay | Admitting: Obstetrics and Gynecology

## 2017-07-07 DIAGNOSIS — E041 Nontoxic single thyroid nodule: Secondary | ICD-10-CM

## 2017-07-08 ENCOUNTER — Ambulatory Visit
Admission: RE | Admit: 2017-07-08 | Discharge: 2017-07-08 | Disposition: A | Discharge status: Home / Self Care | Payer: 59 | Source: Ambulatory Visit | Attending: Obstetrics and Gynecology | Admitting: Obstetrics and Gynecology

## 2017-07-08 DIAGNOSIS — E041 Nontoxic single thyroid nodule: Secondary | ICD-10-CM

## 2017-07-14 ENCOUNTER — Telehealth: Payer: Self-pay | Admitting: *Deleted

## 2017-07-14 NOTE — Telephone Encounter (Signed)
Bianca Parker left a message stating her grandson is coming to visit. Recently had vaccines for military- Anthrax, Typhoid and Flu. Wants to know if being with him over the holiday will pose a risk to her.

## 2017-07-14 NOTE — Telephone Encounter (Signed)
Notified of message below

## 2017-07-14 NOTE — Telephone Encounter (Signed)
I don;t think so She will be fine

## 2017-07-20 ENCOUNTER — Ambulatory Visit: Payer: 59 | Admitting: Gastroenterology

## 2017-07-27 ENCOUNTER — Inpatient Hospital Stay: Payer: 59 | Attending: Hematology and Oncology

## 2017-07-27 VITALS — BP 135/64 | HR 83 | Temp 98.6°F | Resp 18 | Wt 195.8 lb

## 2017-07-27 DIAGNOSIS — D801 Nonfamilial hypogammaglobulinemia: Secondary | ICD-10-CM | POA: Diagnosis not present

## 2017-07-27 MED ORDER — METHYLPREDNISOLONE SODIUM SUCC 40 MG IJ SOLR
20.0000 mg | INTRAMUSCULAR | Status: DC
  Administered 2017-07-27: 20 mg via INTRAVENOUS

## 2017-07-27 MED ORDER — DIPHENHYDRAMINE HCL 25 MG PO CAPS
ORAL_CAPSULE | ORAL | Status: AC
  Filled 2017-07-27: qty 2

## 2017-07-27 MED ORDER — ACETAMINOPHEN 325 MG PO TABS
650.0000 mg | ORAL_TABLET | Freq: Once | ORAL | Status: AC
  Administered 2017-07-27: 650 mg via ORAL

## 2017-07-27 MED ORDER — IMMUNE GLOBULIN (HUMAN) 10 GM/100ML IV SOLN
90.0000 g | Freq: Once | INTRAVENOUS | Status: AC
  Administered 2017-07-27: 90 g via INTRAVENOUS
  Filled 2017-07-27: qty 800

## 2017-07-27 MED ORDER — DIPHENHYDRAMINE HCL 25 MG PO TABS
50.0000 mg | ORAL_TABLET | Freq: Once | ORAL | Status: AC
  Administered 2017-07-27: 50 mg via ORAL
  Filled 2017-07-27: qty 2

## 2017-07-27 MED ORDER — METHYLPREDNISOLONE SODIUM SUCC 40 MG IJ SOLR
INTRAMUSCULAR | Status: AC
  Filled 2017-07-27: qty 1

## 2017-07-27 MED ORDER — ACETAMINOPHEN 325 MG PO TABS
ORAL_TABLET | ORAL | Status: AC
  Filled 2017-07-27: qty 2

## 2017-07-27 NOTE — Patient Instructions (Signed)

## 2017-08-20 ENCOUNTER — Ambulatory Visit: Payer: 59

## 2017-08-20 ENCOUNTER — Other Ambulatory Visit (HOSPITAL_COMMUNITY)
Admission: AD | Admit: 2017-08-20 | Discharge: 2017-08-20 | Disposition: A | Discharge status: Home / Self Care | Payer: 59 | Source: Ambulatory Visit | Attending: Medical | Admitting: Medical

## 2017-08-20 ENCOUNTER — Telehealth: Payer: Self-pay

## 2017-08-20 ENCOUNTER — Ambulatory Visit (HOSPITAL_BASED_OUTPATIENT_CLINIC_OR_DEPARTMENT_OTHER): Payer: 59 | Admitting: Medical

## 2017-08-20 VITALS — BP 162/86 | HR 79 | Temp 97.5°F | Resp 24 | Ht 63.0 in | Wt 196.3 lb

## 2017-08-20 DIAGNOSIS — R05 Cough: Secondary | ICD-10-CM | POA: Diagnosis not present

## 2017-08-20 DIAGNOSIS — D801 Nonfamilial hypogammaglobulinemia: Secondary | ICD-10-CM | POA: Diagnosis not present

## 2017-08-20 DIAGNOSIS — J069 Acute upper respiratory infection, unspecified: Secondary | ICD-10-CM

## 2017-08-20 DIAGNOSIS — C9111 Chronic lymphocytic leukemia of B-cell type in remission: Secondary | ICD-10-CM

## 2017-08-20 DIAGNOSIS — R059 Cough, unspecified: Secondary | ICD-10-CM

## 2017-08-20 LAB — INFLUENZA PANEL BY PCR (TYPE A & B)
INFLAPCR: NEGATIVE
INFLBPCR: NEGATIVE

## 2017-08-20 MED ORDER — DOXYCYCLINE HYCLATE 100 MG PO TABS: 100.0000 mg | ORAL_TABLET | Freq: Two times a day (BID) | ORAL | 0 refills | Status: DC

## 2017-08-20 NOTE — Telephone Encounter (Signed)
Patient called and left message to call her.  Called back. She has developed a respiratory cold, infection or flu overnight. Symptoms are sore throat, cough, tiredness and no fever. Scheduled for IVIG 12/31.  Sent scheduling message to add to Van's schedule at 10 am.

## 2017-08-20 NOTE — Progress Notes (Signed)
[facility]pt[facility] Management [facility] Progress Note   PETINA MURASKI [id number] October 28, 1945 71 y.o.  Aristea BRIENA SWINGLER is managed by Dr. Heath Lark  Actively treated with chemotherapy: no  Current Therapy: IVIG    Last Treated: 12 /03/ 2018  Assessment: Plan:    Cough - Plan: Influenza A and B   Cough, URI, hypogammaglobulinemia: The patient will return on 08/24/2017 for her next dose of IVIG.  She was given a prescription for doxycycline 100 mg p.o. twice daily times 7 days.  Also a rapid influenza A and B screen was obtained.  These results are pending.  Please see After Visit Summary for patient specific instructions.  Future Appointments  Date Time Provider [place]  08/24/2017  8:00 AM [facility] F18 [facility] None  09/21/2017  8:00 AM [facility] F18 [facility] None  10/19/2017  8:00 AM [facility] F18 [facility] None  11/16/2017  8:00 AM [facility] LAB 3 [facility] None  11/16/2017  8:30 AM Heath Lark, MD [facility] None  11/16/2017  9:15 AM [facility] B5 [facility] None    Orders Placed This Encounter  Procedures  . Influenza A and B       Subjective:   Patient ID:  FLOREINE KINGDON is a 71 y.o. (DOB 01/03/1946) female.  Chief Complaint:  Chief Complaint  Patient presents with  . cold symptoms, chills    HPI Duyen C Mondor is a 71 year old female with a history of a chronic lymphocytic leukemia in remission.  She also has a history of hypogammaglobulinemia which was acquired as a side effect of long-term chemotherapy.  She has had recurrent infections with herpes virus, bronchitis, tooth abscesses and skin infections.  She currently receives IVIG with her last dose given on 07/27/2017.  She is due for her next dose on 08/24/2017.  She presents to the office today with a history of a sore throat, chills, nausea, fatigue, generalized body aches, postnasal drainage, sinus pressure and pain, thin clear rhinorrhea, episodic nonproductive cough, and increased  facial pressure with bending forward.  She reports that she had visitors over Christmas holiday that had been sick with upper respiratory symptoms or had been in close contact with others with similar symptoms.  She did receive a flu shot this year.  Medications: I have reviewed the patient's current medications.  Allergies:  Allergies  Allergen Reactions  . Penicillins   . Pentamidine   . Sulfonamide Derivatives     Past Medical History:  Diagnosis Date  . Allergic rhinitis, cause unspecified   . Anemia   . CARCINOMA, SKIN, SQUAMOUS CELL   . Chronic lymphoid leukemia in remission [facility] [place]) 2002 dx, tx 2003   Diagnosed in July 2002. Started on clinical trial at [facility] in June 2003. Now she is reportedly in remission but has a very compromised immune system. She is intermittently been doing IVIG and then was restarted back in July 2016. Now she doesn't have a month.   . Dyslipidemia   . Hepatitis A antibody positive 04/06/2012   Acute diarrheal illness & elevated liver function tests 02/18/12;   . Hyperlipidemia   . Hypogammaglobulinemia, acquired ([place]) 11/18/2011   Due to CLL and prior chemo  . Rectal bleeding 07/24/2015    Past Surgical History:  Procedure Laterality Date  . ABDOMINAL HYSTERECTOMY  1966  . BUNIONECTOMY    . SIGMOIDOSCOPY    . SKIN BIOPSY    . TONSILLECTOMY      Family History  Problem Relation Age of Onset  .  Colon cancer Mother   . Cancer Mother        colon  . Cancer Maternal Grandmother        ovarian  . Cancer Paternal Grandmother        breast  . Breast cancer Paternal Grandmother   . Breast cancer Other   . Ovarian cancer Other   . Breast cancer Paternal Aunt     Social History   Socioeconomic History  . Marital status: Married    Spouse name: Not on file  . Number of children: Not on file  . Years of education: Not on file  . Highest education level: Not on file  Social Needs  . Financial resource strain: Not on file  . Food  insecurity - worry: Not on file  . Food insecurity - inability: Not on file  . Transportation needs - medical: Not on file  . Transportation needs - non-medical: Not on file  Occupational History  . Not on file  Tobacco Use  . Smoking status: Never Smoker  . Smokeless tobacco: Never Used  . Tobacco comment: Married, lives with husband Gershon Crane), grown children. 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 Alcohol use: Yes    Comment: Rare  . Drug use: No  . Sexual activity: Yes  Other Topics Concern  . Not on file  Social History Narrative  . Not on file    Past Medical History, Surgical history, Social history, and Family history were reviewed and updated as appropriate.   Please see review of systems for further details on the patient's review from today.   Review of Systems:  Review of Systems  Constitutional: Positive for chills, fatigue and fever (Subjective fever). Negative for diaphoresis.  HENT: Positive for congestion, postnasal drip, rhinorrhea, sinus pressure, sinus pain and sore throat.   Respiratory: Positive for cough. Negative for shortness of breath.   Cardiovascular: Negative for chest pain.  Gastrointestinal: Positive for nausea. Negative for constipation, diarrhea and vomiting.  Musculoskeletal: Positive for myalgias.  Neurological: Negative for headaches.    Objective:   Physical Exam:  BP (!) 162/86 (BP Location: Right Arm, Patient Position: Sitting)   Pulse 79   Temp (!) 97.5 F (36.4 C) (Oral)   Resp (!) 24   Ht _0  (1.6 m)   Wt 196 lb 4.8 oz (89 kg)   SpO2 98%   BMI 34.77 kg/m  ECOG: 1  Physical Exam  Constitutional: No distress.  HENT:  Head: Normocephalic and atraumatic.  Right Ear: External ear normal.  Left Ear: External ear normal.  Mild erythema of the oropharynx.  No exudate or patches noted.  Cardiovascular: Normal rate, regular rhythm and normal heart sounds. Exam reveals no gallop and no  friction rub.  No murmur heard. Pulmonary/Chest: Effort normal and breath sounds normal. No respiratory distress. She has no wheezes. She has no rales.  Neurological: She is alert. Coordination normal.  Skin: Skin is warm and dry. No rash noted. She is not diaphoretic. No erythema.    Lab Review:     Component Value Date/Time   NA 142 06/01/2017 0756   K 4.2 06/01/2017 0756   CL 104 05/22/2014 0737   CL 109 (H) 01/20/2013 0801   CO2 21 (L) 06/01/2017 0756   GLUCOSE 88 06/01/2017 0756   GLUCOSE 89 01/20/2013 0801   BUN 14.5 06/01/2017 0756   CREATININE 1.0 06/01/2017 0756   CALCIUM 10.1 06/01/2017 0756   PROT  7.7 06/01/2017 0756   ALBUMIN 3.9 06/01/2017 0756   AST 35 (H) 06/01/2017 0756   ALT 28 06/01/2017 0756   ALKPHOS 52 06/01/2017 0756   BILITOT 0.58 06/01/2017 0756   GFRNONAA 57 (L) 04/18/2009 0943   GFRAA  04/18/2009 0943    >60        The eGFR has been calculated using the MDRD equation. This calculation has not been validated in all clinical situations. eGFR's persistently <60 mL/min signify possible Chronic Kidney Disease.       Component Value Date/Time   WBC 6.0 06/01/2017 0756   WBC 4.9 05/22/2014 0737   RBC 4.96 06/01/2017 0756   RBC 4.76 05/22/2014 0737   HGB 14.5 06/01/2017 0756   HCT 43.4 06/01/2017 0756   PLT 166 06/01/2017 0756   MCV 87.5 06/01/2017 0756   MCH 29.1 06/01/2017 0756   MCH 31.3 09/11/2009 1539   MCHC 33.3 06/01/2017 0756   MCHC 33.2 05/22/2014 0737   RDW 14.2 06/01/2017 0756   LYMPHSABS 2.4 06/01/2017 0756   MONOABS 0.5 06/01/2017 0756   EOSABS 0.1 06/01/2017 0756   BASOSABS 0.0 06/01/2017 0756   -------------------------------  Imaging from last 24 hours (if applicable):  Radiology interpretation: No results found.

## 2017-08-20 NOTE — Progress Notes (Signed)
These results were called to Chandra Batch and were reviewed with her . Her were answered. She expressed understanding.

## 2017-08-24 ENCOUNTER — Inpatient Hospital Stay (HOSPITAL_BASED_OUTPATIENT_CLINIC_OR_DEPARTMENT_OTHER): Payer: 59

## 2017-08-24 VITALS — BP 136/70 | HR 89 | Temp 99.0°F | Resp 17 | Wt 191.5 lb

## 2017-08-24 DIAGNOSIS — D801 Nonfamilial hypogammaglobulinemia: Secondary | ICD-10-CM

## 2017-08-24 MED ORDER — DIPHENHYDRAMINE HCL 25 MG PO CAPS
ORAL_CAPSULE | ORAL | Status: AC
  Filled 2017-08-24: qty 2

## 2017-08-24 MED ORDER — DIPHENHYDRAMINE HCL 25 MG PO CAPS
ORAL_CAPSULE | ORAL | Status: AC
  Filled 2017-08-24: qty 1

## 2017-08-24 MED ORDER — IMMUNE GLOBULIN (HUMAN) 10 GM/100ML IV SOLN
90.0000 g | Freq: Once | INTRAVENOUS | Status: AC
  Administered 2017-08-24: 90 g via INTRAVENOUS
  Filled 2017-08-24: qty 800

## 2017-08-24 MED ORDER — ACETAMINOPHEN 325 MG PO TABS
ORAL_TABLET | ORAL | Status: AC
  Filled 2017-08-24: qty 2

## 2017-08-24 MED ORDER — ACETAMINOPHEN 325 MG PO TABS
650.0000 mg | ORAL_TABLET | Freq: Once | ORAL | Status: AC
  Administered 2017-08-24: 650 mg via ORAL

## 2017-08-24 MED ORDER — METHYLPREDNISOLONE SODIUM SUCC 40 MG IJ SOLR
INTRAMUSCULAR | Status: AC
  Filled 2017-08-24: qty 1

## 2017-08-24 MED ORDER — DIPHENHYDRAMINE HCL 25 MG PO TABS
50.0000 mg | ORAL_TABLET | Freq: Once | ORAL | Status: AC
  Administered 2017-08-24: 50 mg via ORAL
  Filled 2017-08-24: qty 2

## 2017-08-24 MED ORDER — METHYLPREDNISOLONE SODIUM SUCC 40 MG IJ SOLR
20.0000 mg | INTRAMUSCULAR | Status: DC
  Administered 2017-08-24: 20 mg via INTRAVENOUS

## 2017-08-24 NOTE — Progress Notes (Signed)
Patient declined to remain in infusion for post IVIG 30 minute observation period. Patient made aware of need for observation period. Patient instructed on risks versus benefits or remaining. Patient verbalized understanding.

## 2017-08-24 NOTE — Patient Instructions (Signed)

## 2017-09-21 ENCOUNTER — Inpatient Hospital Stay: Payer: 59 | Attending: Hematology and Oncology

## 2017-09-21 VITALS — BP 127/84 | HR 75 | Temp 98.9°F | Resp 18

## 2017-09-21 DIAGNOSIS — D801 Nonfamilial hypogammaglobulinemia: Secondary | ICD-10-CM

## 2017-09-21 DIAGNOSIS — Z8041 Family history of malignant neoplasm of ovary: Secondary | ICD-10-CM | POA: Insufficient documentation

## 2017-09-21 DIAGNOSIS — L03114 Cellulitis of left upper limb: Secondary | ICD-10-CM | POA: Insufficient documentation

## 2017-09-21 DIAGNOSIS — Z85828 Personal history of other malignant neoplasm of skin: Secondary | ICD-10-CM | POA: Diagnosis not present

## 2017-09-21 DIAGNOSIS — C9111 Chronic lymphocytic leukemia of B-cell type in remission: Secondary | ICD-10-CM | POA: Insufficient documentation

## 2017-09-21 DIAGNOSIS — Z800 Family history of malignant neoplasm of digestive organs: Secondary | ICD-10-CM | POA: Insufficient documentation

## 2017-09-21 DIAGNOSIS — Z803 Family history of malignant neoplasm of breast: Secondary | ICD-10-CM | POA: Insufficient documentation

## 2017-09-21 MED ORDER — METHYLPREDNISOLONE SODIUM SUCC 40 MG IJ SOLR
20.0000 mg | INTRAMUSCULAR | Status: DC
  Administered 2017-09-21: 20 mg via INTRAVENOUS

## 2017-09-21 MED ORDER — ACETAMINOPHEN 325 MG PO TABS
650.0000 mg | ORAL_TABLET | Freq: Once | ORAL | Status: AC
  Administered 2017-09-21: 650 mg via ORAL

## 2017-09-21 MED ORDER — ACETAMINOPHEN 325 MG PO TABS
ORAL_TABLET | ORAL | Status: AC
  Filled 2017-09-21: qty 2

## 2017-09-21 MED ORDER — IMMUNE GLOBULIN (HUMAN) 10 GM/100ML IV SOLN
90.0000 g | Freq: Once | INTRAVENOUS | Status: AC
  Administered 2017-09-21: 90 g via INTRAVENOUS
  Filled 2017-09-21: qty 800

## 2017-09-21 MED ORDER — DIPHENHYDRAMINE HCL 25 MG PO TABS
50.0000 mg | ORAL_TABLET | Freq: Once | ORAL | Status: AC
  Administered 2017-09-21: 50 mg via ORAL
  Filled 2017-09-21: qty 2

## 2017-09-21 MED ORDER — METHYLPREDNISOLONE SODIUM SUCC 40 MG IJ SOLR
INTRAMUSCULAR | Status: AC
  Filled 2017-09-21: qty 1

## 2017-09-21 MED ORDER — DIPHENHYDRAMINE HCL 25 MG PO CAPS
ORAL_CAPSULE | ORAL | Status: AC
  Filled 2017-09-21: qty 2

## 2017-09-21 NOTE — Patient Instructions (Signed)

## 2017-09-22 ENCOUNTER — Inpatient Hospital Stay (HOSPITAL_BASED_OUTPATIENT_CLINIC_OR_DEPARTMENT_OTHER): Payer: 59 | Admitting: Medical

## 2017-09-22 VITALS — BP 161/89 | HR 98 | Temp 98.4°F | Resp 18 | Ht 63.0 in | Wt 198.3 lb

## 2017-09-22 DIAGNOSIS — Z85828 Personal history of other malignant neoplasm of skin: Secondary | ICD-10-CM | POA: Diagnosis not present

## 2017-09-22 DIAGNOSIS — C9111 Chronic lymphocytic leukemia of B-cell type in remission: Secondary | ICD-10-CM

## 2017-09-22 DIAGNOSIS — D801 Nonfamilial hypogammaglobulinemia: Secondary | ICD-10-CM

## 2017-09-22 DIAGNOSIS — L03114 Cellulitis of left upper limb: Secondary | ICD-10-CM | POA: Diagnosis not present

## 2017-09-22 DIAGNOSIS — Z800 Family history of malignant neoplasm of digestive organs: Secondary | ICD-10-CM

## 2017-09-22 DIAGNOSIS — Z8041 Family history of malignant neoplasm of ovary: Secondary | ICD-10-CM

## 2017-09-22 DIAGNOSIS — Z803 Family history of malignant neoplasm of breast: Secondary | ICD-10-CM

## 2017-09-22 DIAGNOSIS — T148XXA Other injury of unspecified body region, initial encounter: Secondary | ICD-10-CM

## 2017-09-22 DIAGNOSIS — T8089XA Other complications following infusion, transfusion and therapeutic injection, initial encounter: Secondary | ICD-10-CM

## 2017-09-22 MED ORDER — DOXYCYCLINE HYCLATE 100 MG PO TABS: 100.0000 mg | ORAL_TABLET | Freq: Two times a day (BID) | ORAL | 0 refills | Status: DC

## 2017-09-22 NOTE — Progress Notes (Signed)
Symptoms Management Clinic Progress Note   Bianca Parker [id number] 1946/08/01 72 y.o.  Bianca Parker is managed by Dr. Heath Lark  Actively treated with chemotherapy: no  Current Therapy: IVIG  Last Treated: 09/21/2017  Assessment: Plan:    Hypogammaglobulinemia, acquired ([place])  Cellulitis of left upper extremity  Extravasation injury   Hypogammaglobulinemia, acquired: Patient is status post her most recent dosing of IVIG which was given on 09/21/2017.  She is scheduled to follow-up with Dr. Heath Lark on 11/16/2017.  Extravasation injury: The patient is status post an extravasation of a small volume of IVIG in her left lateral forearm yesterday at the end of her infusion.  There is no evidence of skin breakdown.  The patient is instructed to continue to use ice packs (20 minutes on and 20 minutes off) to the area.  Cellulitis of the left upper extremity: The patient was given a prescription for doxycycline 100 mg p.o. twice daily times 7 days.  She was additionally instructed to continue using ice packs (20 minutes on and 20 minutes off) to the area and to return as needed.  Please see After Visit Summary for patient specific instructions.  Future Appointments  Date Time Provider [place]  10/19/2017  8:00 AM [facility] F18 [facility] None  11/16/2017  8:00 AM [facility] LAB 3 [facility] None  11/16/2017  8:30 AM Alvy Bimler, [place], MD [facility] None  11/16/2017  9:15 AM [facility] B5 [facility] None    No orders of the defined types were placed in this encounter.      Subjective:   Patient ID:  Bianca Parker is a 72 y.o. (DOB [birth year]) female.  Chief Complaint:  Chief Complaint  Patient presents with  . Cellulitis    left forearm    HPI Bianca Parker is a 72 year old female with a history of a chronic lymphocytic leukemia in remission.  She also has a history of hypogammaglobulinemia which was acquired as a side effect of long-term  chemotherapy.  She has had recurrent infections with herpes virus, bronchitis, tooth abscesses and skin infections.  She currently receives IVIG with her last dose given on  09/21/2017.    She was near the end of her infusion of IVIG yesterday when she stretched and raised her left arm above her head.  Shortly thereafter, she was noted to have an area of fullness immediately proximal to her IV site.  The patient had around 10 mL's or less of IVIG left to infuse.  The decision was made to stop her infusion at that point.  She was instructed to use ice, 20 minutes on and 20 minutes off to the area and to transition to heat and around 48 hours.  She presents to clinic today stating that the area of erythema that had been around the infusion site has now moved.  She believes that her infusion site was in the anterior left forearm in the middle of her forearm.  I discussed with her that I recall that her infusion site was in the left lateral forearm at the site where she is currently having erythema.  Medications: I have reviewed the patient's current medications.  Allergies:  Allergies  Allergen Reactions  . Penicillins   . Pentamidine   . Sulfonamide Derivatives     Past Medical History:  Diagnosis Date  . Allergic rhinitis, cause unspecified   . Anemia   . CARCINOMA, SKIN, SQUAMOUS CELL   . Chronic lymphoid leukemia in remission ([place]) 2002 dx,  tx 2003   Diagnosed in July 2002. Started on clinical trial at [facility] in June 2003. Now she is reportedly in remission but has a very compromised immune system. She is intermittently been doing IVIG and then was restarted back in July 2016. Now she doesn't have a month.   . Dyslipidemia   . Hepatitis A antibody positive 04/06/2012   Acute diarrheal illness & elevated liver function tests 02/18/12;   . Hyperlipidemia   . Hypogammaglobulinemia, acquired ([place]) 11/18/2011   Due to CLL and prior chemo  . Rectal bleeding 07/24/2015    Past Surgical  History:  Procedure Laterality Date  . ABDOMINAL HYSTERECTOMY  1966  . BUNIONECTOMY    . SIGMOIDOSCOPY    . SKIN BIOPSY    . TONSILLECTOMY      Family History  Problem Relation Age of Onset  . Colon cancer Mother   . Cancer Mother        colon  . Cancer Maternal Grandmother        ovarian  . Cancer Paternal Grandmother        breast  . Breast cancer Paternal Grandmother   . Breast cancer Other   . Ovarian cancer Other   . Breast cancer Paternal Aunt     Social History   Socioeconomic History  . Marital status: Married    Spouse name: Not on file  . Number of children: Not on file  . Years of education: Not on file  . Highest education level: Not on file  Social Needs  . Financial resource strain: Not on file  . Food insecurity - worry: Not on file  . Food insecurity - inability: Not on file  . Transportation needs - medical: Not on file  . Transportation needs - non-medical: Not on file  Occupational History  . Not on file  Tobacco Use  . Smoking status: Never Smoker  . Smokeless tobacco: Never Used  . Tobacco comment: Married, lives with husband Gershon Crane), grown children. 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 Alcohol use: Yes    Comment: Rare  . Drug use: No  . Sexual activity: Yes  Other Topics Concern  . Not on file  Social History Narrative  . Not on file    Past Medical History, Surgical history, Social history, and Family history were reviewed and updated as appropriate.   Please see review of systems for further details on the patient's review from today.   Review of Systems:  Review of Systems  Constitutional: Negative for chills, diaphoresis and fever.  Skin: Positive for color change.       Erythema over the left lateral forearm at an area that is proximal to an IV site.  Patient had an extravasation of a small volume of IVIG yesterday at the end of her infusion.    Objective:   Physical Exam:  BP (!)  161/89 (BP Location: Right Arm, Patient Position: Sitting) Comment: Second reading in the right arm. 10 min wait in between readings  Pulse 98   Temp 98.4 F (36.9 C) (Oral)   Resp 18   Ht _0  (1.6 m)   Wt 198 lb 4.8 oz (89.9 kg)   SpO2 98%   BMI 35.13 kg/m  ECOG: 0  Physical Exam  Constitutional: No distress.  HENT:  Head: Normocephalic and atraumatic.  Neurological: She is alert. Coordination normal.  Skin: She is not diaphoretic.  Lab Review:     Component Value Date/Time   NA 142 06/01/2017 0756   K 4.2 06/01/2017 0756   CL 104 05/22/2014 0737   CL 109 (H) 01/20/2013 0801   CO2 21 (L) 06/01/2017 0756   GLUCOSE 88 06/01/2017 0756   GLUCOSE 89 01/20/2013 0801   BUN 14.5 06/01/2017 0756   CREATININE 1.0 06/01/2017 0756   CALCIUM 10.1 06/01/2017 0756   PROT 7.7 06/01/2017 0756   ALBUMIN 3.9 06/01/2017 0756   AST 35 (H) 06/01/2017 0756   ALT 28 06/01/2017 0756   ALKPHOS 52 06/01/2017 0756   BILITOT 0.58 06/01/2017 0756   GFRNONAA 57 (L) 04/18/2009 0943   GFRAA  04/18/2009 0943    >60        The eGFR has been calculated using the MDRD equation. This calculation has not been validated in all clinical situations. eGFR's persistently <60 mL/min signify possible Chronic Kidney Disease.       Component Value Date/Time   WBC 6.0 06/01/2017 0756   WBC 4.9 05/22/2014 0737   RBC 4.96 06/01/2017 0756   RBC 4.76 05/22/2014 0737   HGB 14.5 06/01/2017 0756   HCT 43.4 06/01/2017 0756   PLT 166 06/01/2017 0756   MCV 87.5 06/01/2017 0756   MCH 29.1 06/01/2017 0756   MCH 31.3 09/11/2009 1539   MCHC 33.3 06/01/2017 0756   MCHC 33.2 05/22/2014 0737   RDW 14.2 06/01/2017 0756   LYMPHSABS 2.4 06/01/2017 0756   MONOABS 0.5 06/01/2017 0756   EOSABS 0.1 06/01/2017 0756   BASOSABS 0.0 06/01/2017 0756   -------------------------------  Imaging from last 24 hours (if applicable):  Radiology interpretation: No results found.

## 2017-10-02 ENCOUNTER — Encounter: Payer: Self-pay | Admitting: Gastroenterology

## 2017-10-19 ENCOUNTER — Inpatient Hospital Stay: Payer: 59 | Attending: Hematology and Oncology

## 2017-10-19 VITALS — BP 149/84 | HR 102 | Temp 99.0°F | Resp 18 | Wt 196.8 lb

## 2017-10-19 DIAGNOSIS — D801 Nonfamilial hypogammaglobulinemia: Secondary | ICD-10-CM | POA: Diagnosis present

## 2017-10-19 MED ORDER — DIPHENHYDRAMINE HCL 25 MG PO CAPS
ORAL_CAPSULE | ORAL | Status: AC
  Filled 2017-10-19: qty 2

## 2017-10-19 MED ORDER — ACETAMINOPHEN 325 MG PO TABS
650.0000 mg | ORAL_TABLET | Freq: Once | ORAL | Status: AC
  Administered 2017-10-19: 650 mg via ORAL

## 2017-10-19 MED ORDER — DIPHENHYDRAMINE HCL 25 MG PO TABS
50.0000 mg | ORAL_TABLET | Freq: Once | ORAL | Status: AC
  Administered 2017-10-19: 50 mg via ORAL
  Filled 2017-10-19: qty 2

## 2017-10-19 MED ORDER — METHYLPREDNISOLONE SODIUM SUCC 40 MG IJ SOLR
INTRAMUSCULAR | Status: AC
  Filled 2017-10-19: qty 1

## 2017-10-19 MED ORDER — METHYLPREDNISOLONE SODIUM SUCC 40 MG IJ SOLR
20.0000 mg | INTRAMUSCULAR | Status: DC
  Administered 2017-10-19: 20 mg via INTRAVENOUS

## 2017-10-19 MED ORDER — ACETAMINOPHEN 325 MG PO TABS
ORAL_TABLET | ORAL | Status: AC
  Filled 2017-10-19: qty 2

## 2017-10-19 MED ORDER — IMMUNE GLOBULIN (HUMAN) 10 GM/100ML IV SOLN
1.0000 g/kg | Freq: Once | INTRAVENOUS | Status: AC
  Administered 2017-10-19: 90 g via INTRAVENOUS
  Filled 2017-10-19: qty 800

## 2017-10-19 NOTE — Patient Instructions (Signed)

## 2017-11-13 ENCOUNTER — Other Ambulatory Visit: Payer: Self-pay

## 2017-11-13 DIAGNOSIS — C9111 Chronic lymphocytic leukemia of B-cell type in remission: Secondary | ICD-10-CM

## 2017-11-16 ENCOUNTER — Inpatient Hospital Stay: Payer: 59 | Attending: Hematology and Oncology | Admitting: Hematology and Oncology

## 2017-11-16 ENCOUNTER — Inpatient Hospital Stay: Payer: 59

## 2017-11-16 ENCOUNTER — Encounter: Payer: Self-pay | Admitting: Hematology and Oncology

## 2017-11-16 VITALS — BP 150/83 | HR 79 | Temp 98.5°F | Resp 18 | Ht 63.0 in | Wt 196.3 lb

## 2017-11-16 VITALS — BP 150/72 | HR 91 | Temp 99.3°F | Resp 16

## 2017-11-16 DIAGNOSIS — D801 Nonfamilial hypogammaglobulinemia: Secondary | ICD-10-CM | POA: Insufficient documentation

## 2017-11-16 DIAGNOSIS — J328 Other chronic sinusitis: Secondary | ICD-10-CM | POA: Diagnosis not present

## 2017-11-16 DIAGNOSIS — C9111 Chronic lymphocytic leukemia of B-cell type in remission: Secondary | ICD-10-CM | POA: Diagnosis not present

## 2017-11-16 DIAGNOSIS — Z85828 Personal history of other malignant neoplasm of skin: Secondary | ICD-10-CM | POA: Diagnosis not present

## 2017-11-16 LAB — CMP (CANCER CENTER ONLY)
ALT: 33 U/L (ref 10–47)
ANION GAP: 10 (ref 5–15)
AST: 36 U/L (ref 11–38)
Albumin: 3.9 g/dL (ref 3.5–5.0)
Alkaline Phosphatase: 53 U/L (ref 40–150)
BUN: 13 mg/dL (ref 7–26)
CHLORIDE: 108 mmol/L (ref 98–109)
CO2: 23 mmol/L (ref 22–29)
Calcium: 10 mg/dL (ref 8.4–10.4)
Creatinine: 0.95 mg/dL (ref 0.60–1.20)
GFR, Estimated: 59 mL/min — ABNORMAL LOW (ref >60)
Glucose, Bld: 94 mg/dL (ref 70–140)
Potassium: 4.1 mmol/L (ref 3.5–5.1)
SODIUM: 141 mmol/L (ref 136–145)
Total Bilirubin: 0.6 mg/dL (ref 0.2–1.6)
Total Protein: 7.5 g/dL (ref 6.4–8.3)

## 2017-11-16 LAB — CBC WITH DIFFERENTIAL/PLATELET
Basophils Absolute: 0 10*3/uL (ref 0.0–0.1)
Basophils Relative: 0 %
Eosinophils Absolute: 0.1 10*3/uL (ref 0.0–0.5)
Eosinophils Relative: 2 %
HEMATOCRIT: 43.3 % (ref 34.8–46.6)
HEMOGLOBIN: 14.4 g/dL (ref 11.6–15.9)
LYMPHS ABS: 1.7 10*3/uL (ref 0.9–3.3)
LYMPHS PCT: 44 %
MCH: 29.3 pg (ref 25.1–34.0)
MCHC: 33.3 g/dL (ref 31.5–36.0)
MCV: 88 fL (ref 79.5–101.0)
Monocytes Absolute: 0.4 10*3/uL (ref 0.1–0.9)
Monocytes Relative: 12 %
NEUTROS ABS: 1.6 10*3/uL (ref 1.5–6.5)
NEUTROS PCT: 42 %
Platelets: 149 10*3/uL (ref 145–400)
RBC: 4.92 MIL/uL (ref 3.70–5.45)
RDW: 14 % (ref 11.2–14.5)
WBC: 3.8 10*3/uL — ABNORMAL LOW (ref 3.9–10.3)

## 2017-11-16 MED ORDER — IMMUNE GLOBULIN (HUMAN) 10 GM/100ML IV SOLN
1.0000 g/kg | Freq: Once | INTRAVENOUS | Status: AC
  Administered 2017-11-16: 90 g via INTRAVENOUS
  Filled 2017-11-16: qty 800

## 2017-11-16 MED ORDER — DIPHENHYDRAMINE HCL 25 MG PO TABS
50.0000 mg | ORAL_TABLET | Freq: Once | ORAL | Status: AC
  Administered 2017-11-16: 50 mg via ORAL
  Filled 2017-11-16: qty 2

## 2017-11-16 MED ORDER — ACYCLOVIR 400 MG PO TABS: 400.0000 mg | ORAL_TABLET | Freq: Two times a day (BID) | ORAL | 6 refills | Status: DC

## 2017-11-16 MED ORDER — METHYLPREDNISOLONE SODIUM SUCC 40 MG IJ SOLR
INTRAMUSCULAR | Status: AC
  Filled 2017-11-16: qty 1

## 2017-11-16 MED ORDER — DIPHENHYDRAMINE HCL 25 MG PO CAPS
ORAL_CAPSULE | ORAL | Status: AC
Start: 2017-11-16 — End: 2017-11-16
  Filled 2017-11-16: qty 2

## 2017-11-16 MED ORDER — ACETAMINOPHEN 325 MG PO TABS
ORAL_TABLET | ORAL | Status: AC
  Filled 2017-11-16: qty 2

## 2017-11-16 MED ORDER — ACETAMINOPHEN 325 MG PO TABS
650.0000 mg | ORAL_TABLET | Freq: Once | ORAL | Status: AC
  Administered 2017-11-16: 650 mg via ORAL

## 2017-11-16 MED ORDER — METHYLPREDNISOLONE SODIUM SUCC 40 MG IJ SOLR
20.0000 mg | INTRAMUSCULAR | Status: DC
  Administered 2017-11-16: 20 mg via INTRAVENOUS

## 2017-11-16 NOTE — Assessment & Plan Note (Signed)
She had history of skin cancer and is at risk of seeing cancer She is being monitored closely by dermatologist We discussed the importance of skin protection with sunscreen and avoidance of excessive sun exposure

## 2017-11-16 NOTE — Assessment & Plan Note (Signed)
Clinically, she remained in remission. I will continue with annual follow-up with history, physical examination and blood work.  

## 2017-11-16 NOTE — Assessment & Plan Note (Signed)
She had recurrent chronic sinusitis She has recovered from recent antibiotic treatment She will continue over-the-counter  medications for allergies

## 2017-11-16 NOTE — Assessment & Plan Note (Signed)
This is related to long-term side effects from prior chemotherapy. She has recurrent infection with herpesvirus, bronchitis, tooth abscess and skin infections in the past She is already on chronic treatment with Valtrex daily. She is doing well and will continue IVIG treatment at 1 g/kg every [redacted] weeks along with premedication with Benadryl, Tylenol and Solu-Medrol. The risk, benefit, side effects of IVIG is fully discussed with the patient and she agreed to proceed. She denies recent serum sickness According to the patient, she does better with slower infusion. We will budget 8 hours infusion for her each time I also recommend consideration for home infusion.  I will work with advanced home care agency to see if home infusion is feasible and not

## 2017-11-16 NOTE — Patient Instructions (Signed)
[facility] Discharge Instructions for Patients Receiving Chemotherapy/ IVIG  Today you received the following chemotherapy agents Privigen.   To help prevent nausea and vomiting after your treatment, we encourage you to take your nausea medication as prescribed.    If you develop nausea and vomiting that is not controlled by your nausea medication, call the clinic.   BELOW ARE SYMPTOMS THAT SHOULD BE REPORTED IMMEDIATELY:  *FEVER GREATER THAN 100.5 F  *CHILLS WITH OR WITHOUT FEVER  NAUSEA AND VOMITING THAT IS NOT CONTROLLED WITH YOUR NAUSEA MEDICATION  *UNUSUAL SHORTNESS OF BREATH  *UNUSUAL BRUISING OR BLEEDING  TENDERNESS IN MOUTH AND THROAT WITH OR WITHOUT PRESENCE OF ULCERS  *URINARY PROBLEMS  *BOWEL PROBLEMS  UNUSUAL RASH Items with * indicate a potential emergency and should be followed up as soon as possible.  Feel free to call the clinic should you have any questions or concerns. The clinic phone number is [phone].  Please show the [place] at check-in to the Emergency Department and triage nurse.

## 2017-11-16 NOTE — Progress Notes (Signed)
[facility] OFFICE PROGRESS NOTE  Patient Care Team: Tisovec, Fransico Him, MD as PCP - General (Internal Medicine) Deneise Lever, MD as Referring Physician (Pulmonary Disease) Rozetta Nunnery, MD as Referring Physician (Otolaryngology) Milus Banister, MD (Gastroenterology) Arvella Nigh, MD (Obstetrics and Gynecology) Heath Lark, MD as Consulting Physician (Hematology and Oncology) Jari Pigg, MD as Consulting Physician (Dermatology) Marygrace Drought, MD as Consulting Physician (Ophthalmology)  ASSESSMENT & PLAN:  Chronic lymphoid leukemia in remission Clinically, she remained in remission. I will continue with annual follow-up with history, physical examination and blood work.   Hypogammaglobulinemia, acquired This is related to long-term side effects from prior chemotherapy. She has recurrent infection with herpesvirus, bronchitis, tooth abscess and skin infections in the past She is already on chronic treatment with Valtrex daily. She is doing well and will continue IVIG treatment at 1 g/kg every [redacted] weeks along with premedication with Benadryl, Tylenol and Solu-Medrol. The risk, benefit, side effects of IVIG is fully discussed with the patient and she agreed to proceed. She denies recent serum sickness According to the patient, she does better with slower infusion. We will budget 8 hours infusion for her each time I also recommend consideration for home infusion.  I will work with advanced home care agency to see if home infusion is feasible and not  RHINOSINUSITIS, CHRONIC She had recurrent chronic sinusitis She has recovered from recent antibiotic treatment She will continue over-the-counter  medications for allergies  History of skin cancer She had history of skin cancer and is at risk of seeing cancer She is being monitored closely by dermatologist We discussed the importance of skin protection with sunscreen and avoidance of excessive sun  exposure   Orders Placed This Encounter  Procedures  . Ambulatory referral to Home Health    Referral Priority:   Routine    Referral Type:   Home Health Care    Referral Reason:   Specialty Services Required    Requested Specialty:   [place]    Number of Visits Requested:   1    INTERVAL HISTORY: Please see below for problem oriented charting. She returns for further follow-up She had multiple recurrent skin infections/sinus infection resolved with antibiotic treatment She denies severe pneumonia or hospitalization requiring IV antibiotic treatment She is undergoing a lot of stress due to frequent travel to get a beat home renovated Her appetite is stable, no recent weight loss, abnormal night sweats or abnormal lymphadenopathy. She denies recent serum sickness with treatment She has close follow-up with dermatologist for recurrent skin cancer She has poor venous access but has declined port placement  SUMMARY OF ONCOLOGIC HISTORY:  She was in one of the first cohorts of patients treated with FCR on protocol at the [facility] at time of her initial diagnosis in July 2002. Technically she had RAI  stage III disease due to anemia but she had no organomegaly or lymphadenopathy on exam. FCR was started in 12/2001, and continued through 06/2002. She achieved a complete hematologic response and,which has been durable now for over 11 years!  She did develop hypogammaglobulinemia. As a result, she gets recurrent bronchitis and sinusitis. In April 2013 she was started her on monthly intravenous immunoglobulin 400 mg per kilogram and this has significantly impacted on the frequency and severity of her infections. This was continued through April of 2014. She has had no new infections since stopping the IVIG but as expected, immunoglobulin levels have fallen to pretreatment  levels.   She has a history of her recurrent squamous cell carcinomas of her skin and has had 2  lesions excised from her left tibial area and another 1 excised from the vulvar area. Recently, she had a basal cell carcinoma removed. She has a history of both herpes zoster and herpes simplex infections. Recently, she had abnormal Pap smear and currently on close observation. Recent colposcopy was negative.  She received IVIG treatment for acquired hypogammaglobulinemia but stopped in April 2014 due to sick serum syndrome In July 2016, IVIG is resumed with premedication In August 2016, she developed cellulitis from venipuncture, resolved with antibiotics From 2016 to 2019, she continues to have recurrent infection requiring antibiotic therapy  REVIEW OF SYSTEMS:   Constitutional: Denies fevers, chills or abnormal weight loss Eyes: Denies blurriness of vision Ears, nose, mouth, throat, and face: Denies mucositis or sore throat Respiratory: Denies cough, dyspnea or wheezes Cardiovascular: Denies palpitation, chest discomfort or lower extremity swelling Gastrointestinal:  Denies nausea, heartburn or change in bowel habits Lymphatics: Denies new lymphadenopathy or easy bruising Neurological:Denies numbness, tingling or new weaknesses Behavioral/Psych: Mood is stable, no new changes  All other systems were reviewed with the patient and are negative.  I have reviewed the past medical history, past surgical history, social history and family history with the patient and they are unchanged from previous note.  ALLERGIES:  is allergic to penicillins; pentamidine; and sulfonamide derivatives.  MEDICATIONS:  Current Outpatient Medications  Medication Sig Dispense Refill  . acyclovir (ZOVIRAX) 400 MG tablet Take 1 tablet (400 mg total) by mouth 2 (two) times daily. 60 tablet 6  . Calcium Carbonate-Vitamin D (CALTRATE 600+D) 600-400 MG-UNIT per tablet Take 1 tablet by mouth daily.    . fexofenadine (ALLEGRA) 180 MG tablet Take 180 mg by mouth daily.      Marland Kitchen guaiFENesin (MUCINEX) 600 MG 12 hr tablet  Take by mouth daily as needed. Reported on 08/24/2015    . levothyroxine (SYNTHROID, LEVOTHROID) 50 MCG tablet Take 50 mcg by mouth daily before breakfast.     . Multiple Vitamin (MULTIVITAMIN) tablet Take 1 tablet by mouth daily.    . mupirocin ointment (BACTROBAN) 2 % Apply 1 application topically as needed.     . psyllium (METAMUCIL) 58.6 % powder Take 1 packet by mouth daily.    . ranitidine (ZANTAC) 150 MG tablet Take 300 mg by mouth at bedtime.     . valACYclovir (VALTREX) 500 MG tablet Take 500 mg by mouth as needed.     No current facility-administered medications for this visit.    Facility-Administered Medications Ordered in Other Visits  Medication Dose Route Frequency Provider Last Rate Last Dose  . methylPREDNISolone sodium succinate (SOLU-MEDROL) 40 mg/mL injection 20 mg  20 mg Intravenous Q30 days Heath Lark, MD   20 mg at 02/09/17 0920  . methylPREDNISolone sodium succinate (SOLU-MEDROL) 40 mg/mL injection 20 mg  20 mg Intravenous Q30 days Heath Lark, MD   20 mg at 11/16/17 0936    PHYSICAL EXAMINATION: ECOG PERFORMANCE STATUS: 1 - Symptomatic but completely ambulatory  Vitals:   11/16/17 0815  BP: (!) 150/83  Pulse: 79  Resp: 18  Temp: 98.5 F (36.9 C)  SpO2: 98%   Filed Weights   11/16/17 0815  Weight: 196 lb 4.8 oz (89 kg)    GENERAL:alert, no distress and comfortable SKIN: Noted discoloration on her skin over her nose bridge  eYES: normal, Conjunctiva are pink and non-injected, sclera clear OROPHARYNX:no exudate, no  erythema and lips, buccal mucosa, and tongue normal  NECK: supple, thyroid normal size, non-tender, without nodularity LYMPH:  no palpable lymphadenopathy in the cervical, axillary or inguinal LUNGS: clear to auscultation and percussion with normal breathing effort HEART: regular rate & rhythm and no murmurs and no lower extremity edema ABDOMEN:abdomen soft, non-tender and normal bowel sounds Musculoskeletal:no cyanosis of digits and no  clubbing  NEURO: alert & oriented x 3 with fluent speech, no focal motor/sensory deficits  LABORATORY DATA:  I have reviewed the data as listed    Component Value Date/Time   NA 141 11/16/2017 0747   NA 142 06/01/2017 0756   K 4.1 11/16/2017 0747   K 4.2 06/01/2017 0756   CL 108 11/16/2017 0747   CL 109 (H) 01/20/2013 0801   CO2 23 11/16/2017 0747   CO2 21 (L) 06/01/2017 0756   GLUCOSE 94 11/16/2017 0747   GLUCOSE 88 06/01/2017 0756   GLUCOSE 89 01/20/2013 0801   BUN 13 11/16/2017 0747   BUN 14.5 06/01/2017 0756   CREATININE 0.95 11/16/2017 0747   CREATININE 1.0 06/01/2017 0756   CALCIUM 10.0 11/16/2017 0747   CALCIUM 10.1 06/01/2017 0756   PROT 7.5 11/16/2017 0747   PROT 7.7 06/01/2017 0756   ALBUMIN 3.9 11/16/2017 0747   ALBUMIN 3.9 06/01/2017 0756   AST 36 11/16/2017 0747   AST 35 (H) 06/01/2017 0756   ALT 33 11/16/2017 0747   ALT 28 06/01/2017 0756   ALKPHOS 53 11/16/2017 0747   ALKPHOS 52 06/01/2017 0756   BILITOT 0.6 11/16/2017 0747   BILITOT 0.58 06/01/2017 0756   GFRNONAA 59 (L) 11/16/2017 0747   GFRAA >60 11/16/2017 0747    No results found for: SPEP, UPEP  Lab Results  Component Value Date   WBC 3.8 (L) 11/16/2017   NEUTROABS 1.6 11/16/2017   HGB 14.4 11/16/2017   HCT 43.3 11/16/2017   MCV 88.0 11/16/2017   PLT 149 11/16/2017      Chemistry      Component Value Date/Time   NA 141 11/16/2017 0747   NA 142 06/01/2017 0756   K 4.1 11/16/2017 0747   K 4.2 06/01/2017 0756   CL 108 11/16/2017 0747   CL 109 (H) 01/20/2013 0801   CO2 23 11/16/2017 0747   CO2 21 (L) 06/01/2017 0756   BUN 13 11/16/2017 0747   BUN 14.5 06/01/2017 0756   CREATININE 0.95 11/16/2017 0747   CREATININE 1.0 06/01/2017 0756   GLU 92 06/17/2013      Component Value Date/Time   CALCIUM 10.0 11/16/2017 0747   CALCIUM 10.1 06/01/2017 0756   ALKPHOS 53 11/16/2017 0747   ALKPHOS 52 06/01/2017 0756   AST 36 11/16/2017 0747   AST 35 (H) 06/01/2017 0756   ALT 33 11/16/2017  0747   ALT 28 06/01/2017 0756   BILITOT 0.6 11/16/2017 0747   BILITOT 0.58 06/01/2017 0756       All questions were answered. The patient knows to call the clinic with any problems, questions or concerns. No barriers to learning was detected.  I spent 25 minutes counseling the patient face to face. The total time spent in the appointment was 30 minutes and more than 50% was on counseling and review of test results  Heath Lark, MD 11/16/2017 12:40 PM

## 2017-11-17 ENCOUNTER — Telehealth: Payer: Self-pay | Admitting: Hematology and Oncology

## 2017-11-17 ENCOUNTER — Encounter: Payer: Self-pay | Admitting: Hematology and Oncology

## 2017-11-17 LAB — IGG, IGA, IGM
IGG (IMMUNOGLOBIN G), SERUM: 1396 mg/dL (ref 700–1600)
IgM (Immunoglobulin M), Srm: 9 mg/dL — ABNORMAL LOW (ref 26–217)

## 2017-11-17 NOTE — Telephone Encounter (Signed)
Mailed patient calendar of upcoming April through September appointments.

## 2017-11-23 ENCOUNTER — Encounter: Payer: Self-pay | Admitting: Hematology and Oncology

## 2017-12-01 ENCOUNTER — Telehealth: Payer: Self-pay | Admitting: Hematology and Oncology

## 2017-12-01 NOTE — Telephone Encounter (Signed)
Patient called to cancel she is going [facility] for [facility]. She will call if other dates are to be cancelled

## 2017-12-04 ENCOUNTER — Telehealth: Payer: Self-pay

## 2017-12-04 NOTE — Telephone Encounter (Signed)
Received message from Carolynn Sayers at [facility]. [facility] has scheduled home IVIG to be given at home on 4/17. Infusion appts canceled. She is scheduled to come to [facility] on 9/6 to see Dr. Alvy Bimler.

## 2017-12-07 ENCOUNTER — Encounter: Payer: Self-pay | Admitting: Gastroenterology

## 2017-12-07 ENCOUNTER — Ambulatory Visit (INDEPENDENT_AMBULATORY_CARE_PROVIDER_SITE_OTHER): Payer: 59 | Admitting: Gastroenterology

## 2017-12-07 VITALS — BP 122/84 | HR 80 | Ht 63.25 in | Wt 193.1 lb

## 2017-12-07 DIAGNOSIS — K5732 Diverticulitis of large intestine without perforation or abscess without bleeding: Secondary | ICD-10-CM

## 2017-12-07 DIAGNOSIS — K219 Gastro-esophageal reflux disease without esophagitis: Secondary | ICD-10-CM | POA: Diagnosis not present

## 2017-12-07 MED ORDER — PEG 3350-KCL-NA BICARB-NACL 420 G PO SOLR: 4000.0000 mL | ORAL | 0 refills | Status: DC

## 2017-12-07 NOTE — Progress Notes (Signed)
Review of pertinent gastrointestinal problems: 1. Acquired immune deficiency from chemo 2. Chronic diarrhea (? From #1?); 7/13 Colonoscopy to TI normal, random biopsies negative for colitis; EGD 7/13 normal, biopsies of duodenum negative for Celiac.  Stool cul 3. Routine risk for colon cancer: colonoscopy 7/13 with HP only, also diverticulosis, repeat colon in 10 years.  Mother with colon cancer in her 58's. 4. Acute diverticulitis 2018: Confirmed by CT scan ( [facility] noted 05/2017),PCP; cipro/flagyl 5. GERD symptoms 2018 (pyrosis, reflux, cough at night) preferred not to take PPI    HPI: This is a very pleasant 72 year old woman whom I last saw 2 or 3 years ago.  She was here more recently and saw Nevin Bloodgood for GERD, recent diverticulitis  Chief complaint is GERD, intermittent vomiting, history of diverticulitis  IV IG every 4 weeks.  Started taking zantac around the time of her infusions because of pryosis.   She takes the Zantac after dinner.  Lays down for bed 2 or 3 hours later.  Last June diverticulitis (2018), pain fever.  Saw her PCP; had a CT scan (no records here).  Took two abx and took a lot of yogurt.  Took imodium periodically.  Was eating a lot of fruits, vegetable, grains.  THen switched to soft diet around time of diverticultitis.    Believes that acid reflux will cause coughing at times, then gets emesis.  All of these problems were better until this past weekend. Felt regurg leading to coughing.  And then she had vomiting   Vomiting again yesterday several times.  She takes zantac 150mg  at bedtime.  Started acyclovir 3 weeks ago.  To prevent shingles and for periodic cold sores.  ROS: complete GI ROS as described in HPI, all other review negative.  Constitutional:  No unintentional weight loss; her weight is the same compared to office visit 6 months ago same scale   Past Medical History:  Diagnosis Date  . Allergic rhinitis, cause unspecified   . Anemia   .  CARCINOMA, SKIN, SQUAMOUS CELL   . Chronic lymphoid leukemia in remission [facility]) 2002 dx, tx 2003   Diagnosed in July 2002. Started on clinical trial at [facility] in June 2003. Now she is reportedly in remission but has a very compromised immune system. She is intermittently been doing IVIG and then was restarted back in July 2016. Now she doesn't have a month.   . Dyslipidemia   . Hepatitis A antibody positive 04/06/2012   Acute diarrheal illness & elevated liver function tests 02/18/12;   . Hyperlipidemia   . Hypogammaglobulinemia, acquired ([place]) 11/18/2011   Due to CLL and prior chemo  . Rectal bleeding 07/24/2015    Past Surgical History:  Procedure Laterality Date  . ABDOMINAL HYSTERECTOMY  1966  . BUNIONECTOMY    . SIGMOIDOSCOPY    . SKIN BIOPSY    . TONSILLECTOMY      Current Outpatient Medications  Medication Sig Dispense Refill  . acyclovir (ZOVIRAX) 400 MG tablet Take 1 tablet (400 mg total) by mouth 2 (two) times daily. 60 tablet 6  . Calcium Carbonate-Vitamin D (CALTRATE 600+D) 600-400 MG-UNIT per tablet Take 1 tablet by mouth daily.    . fexofenadine (ALLEGRA) 180 MG tablet Take 180 mg by mouth daily.      Marland Kitchen guaiFENesin (MUCINEX) 600 MG 12 hr tablet Take by mouth daily as needed. Reported on 08/24/2015    . levothyroxine (SYNTHROID, LEVOTHROID) 50 MCG tablet Take 50 mcg by mouth daily before breakfast.     .  Multiple Vitamin (MULTIVITAMIN) tablet Take 1 tablet by mouth daily.    . mupirocin ointment (BACTROBAN) 2 % Apply 1 application topically as needed.     . psyllium (METAMUCIL) 58.6 % powder Take 1 packet by mouth daily.    . ranitidine (ZANTAC) 150 MG tablet Take 300 mg by mouth at bedtime.      No current facility-administered medications for this visit.    Facility-Administered Medications Ordered in Other Visits  Medication Dose Route Frequency Provider Last Rate Last Dose  . methylPREDNISolone sodium succinate (SOLU-MEDROL) 40 mg/mL injection 20 mg  20 mg  Intravenous Q30 days Heath Lark, MD   20 mg at 02/09/17 0920    Allergies as of 12/07/2017 - Review Complete 12/07/2017  Allergen Reaction Noted  . Penicillins    . Pentamidine  11/14/2011  . Sulfonamide derivatives      Family History  Problem Relation Age of Onset  . Colon cancer Mother   . Cancer Mother        colon  . Cancer Maternal Grandmother        ovarian  . Cancer Paternal Grandmother        breast  . Breast cancer Paternal Grandmother   . Breast cancer Other   . Ovarian cancer Other   . Breast cancer Paternal Aunt     Social History   Socioeconomic History  . Marital status: Married    Spouse name: Not on file  . Number of children: Not on file  . Years of education: Not on file  . Highest education level: Not on file  Occupational History  . Not on file  Social Needs  . Financial resource strain: Not on file  . Food insecurity:    Worry: Not on file    Inability: Not on file  . Transportation needs:    Medical: Not on file    Non-medical: Not on file  Tobacco Use  . Smoking status: Never Smoker  . Smokeless tobacco: Never Used  . Tobacco comment: Married, lives with husband Gershon Crane), grown children. 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 Alcohol use: Yes    Comment: Rare  . Drug use: No  . Sexual activity: Yes  Lifestyle  . Physical activity:    Days per week: Not on file    Minutes per session: Not on file  . Stress: Not on file  Relationships  . Social connections:    Talks on phone: Not on file    Gets together: Not on file    Attends religious service: Not on file    Active member of club or organization: Not on file    Attends meetings of clubs or organizations: Not on file    Relationship status: Not on file  . Intimate partner violence:    Fear of current or ex partner: Not on file    Emotionally abused: Not on file    Physically abused: Not on file    Forced sexual activity: Not on file   Other Topics Concern  . Not on file  Social History Narrative  . Not on file     Physical Exam: BP 122/84 (BP Location: Left Arm, Patient Position: Sitting, Cuff Size: Normal)   Pulse 80   Ht 5' 3.25" (1.607 m) Comment: height measured without shoes  Wt 193 lb 2 oz (87.6 kg)   BMI 33.94 kg/m  Constitutional: generally well-appearing Psychiatric: alert and oriented x3 Abdomen: soft, nontender,  nondistended, no obvious ascites, no peritoneal signs, normal bowel sounds No peripheral edema noted in lower extremities  Assessment and plan: 72 y.o. female with history of diverticulitis 2018, GERD, vomiting recently  First I had recommended colonoscopy when she saw Nevin Bloodgood here in our office 6 months ago for her change in bowels, clinical and CT confirmed diverticulitis over the summer 2018.  She preferred to wait until after the winter, flu season.  I still think that that test is a good idea to confirm no underlying neoplastic changes since it has been about 6 years since her last colonoscopy.  She has been having vomiting, worsening GERD.  She is not taking her H2 blocker at the correct time necessarily and so I advised her to take it at bedtime instead of after dinner.  I also recommended she try over-the-counter proton pump inhibitor shortly before breakfast once daily for better acid control.  We will proceed with EGD at the same time of her colonoscopy given her vomiting, GERD symptoms, rule out H. pylori, peptic ulcer disease, significant gastritis, doubtful neoplasm.  Please see the "Patient Instructions" section for addition details about the plan.  Owens Loffler, MD [facility] 12/07/2017, 8:35 AM

## 2017-12-07 NOTE — Patient Instructions (Addendum)
Please start omeprazole OTC 20mg , once daily before breakfast. You will be set up for a colonoscopy; for diverticulitis. You will be set up for an upper endoscopy for GERD. Change your zantac to bedtime dosing.  Normal BMI (Body Mass Index- based on height and weight) is between 23 and 30. Your BMI today is Body mass index is 33.94 kg/m. Marland Kitchen Please consider follow up  regarding your BMI with your Primary Care Provider.

## 2017-12-10 ENCOUNTER — Telehealth: Payer: Self-pay | Admitting: Medical Oncology

## 2017-12-10 NOTE — Telephone Encounter (Signed)
Hassan Rowan, pls coordinate all future infusion through [facility]

## 2017-12-10 NOTE — Telephone Encounter (Signed)
RN reports home IVIG went very well. Pt had some flushing and a headache that resolved after the infusion. Pt was very happy that she can receive this at home. She wants to do her next IVIG on May 22 before her son's wedding. Cc Dr. Alvy Bimler.

## 2017-12-11 ENCOUNTER — Ambulatory Visit: Payer: 59

## 2017-12-22 ENCOUNTER — Encounter: Payer: Medicare Other | Admitting: Gastroenterology

## 2017-12-22 ENCOUNTER — Telehealth: Payer: Self-pay | Admitting: *Deleted

## 2017-12-22 NOTE — Telephone Encounter (Signed)
"  Lorelee New RN, [facility].  I see Ama Bethanie Dicker in the home for IVIG.  Next scheduled IVIG will not be until 01-13-2018, a week later than planned."  Call transferred.

## 2018-01-08 ENCOUNTER — Ambulatory Visit: Payer: 59

## 2018-01-18 ENCOUNTER — Encounter: Payer: Self-pay | Admitting: Gastroenterology

## 2018-01-29 ENCOUNTER — Encounter: Payer: Self-pay | Admitting: Gastroenterology

## 2018-01-29 ENCOUNTER — Ambulatory Visit (AMBULATORY_SURGERY_CENTER): Payer: 59 | Admitting: Gastroenterology

## 2018-01-29 ENCOUNTER — Other Ambulatory Visit: Payer: Self-pay

## 2018-01-29 VITALS — BP 124/59 | HR 62 | Temp 98.4°F | Resp 13 | Ht 63.0 in | Wt 193.0 lb

## 2018-01-29 DIAGNOSIS — K219 Gastro-esophageal reflux disease without esophagitis: Secondary | ICD-10-CM

## 2018-01-29 DIAGNOSIS — K2100 Gastro-esophageal reflux disease with esophagitis, without bleeding: Secondary | ICD-10-CM

## 2018-01-29 DIAGNOSIS — K5732 Diverticulitis of large intestine without perforation or abscess without bleeding: Secondary | ICD-10-CM | POA: Diagnosis present

## 2018-01-29 DIAGNOSIS — K449 Diaphragmatic hernia without obstruction or gangrene: Secondary | ICD-10-CM

## 2018-01-29 DIAGNOSIS — D122 Benign neoplasm of ascending colon: Secondary | ICD-10-CM

## 2018-01-29 MED ORDER — SODIUM CHLORIDE 0.9 % IV SOLN: 500.0000 mL | Freq: Once | INTRAVENOUS | Status: DC

## 2018-01-29 NOTE — Op Note (Signed)
[facility] Patient Name: Bianca Parker Procedure Date: 01/29/2018 2:16 PM MRN: [id number] Endoscopist: Milus Banister , MD Age: 72 Referring MD:  Date of Birth: Aug 05, 1946 Gender: Female Account #: [id number] Procedure:                Upper GI endoscopy Indications:              Heartburn Medicines:                Monitored Anesthesia Care Procedure:                Pre-Anesthesia Assessment:                           - Prior to the procedure, a History and Physical                            was performed, and patient medications and                            allergies were reviewed. The patient's tolerance of                            previous anesthesia was also reviewed. The risks                            and benefits of the procedure and the sedation                            options and risks were discussed with the patient.                            All questions were answered, and informed consent                            was obtained. Prior Anticoagulants: The patient has                            taken no previous anticoagulant or antiplatelet                            agents. ASA Grade Assessment: II - A patient with                            mild systemic disease. After reviewing the risks                            and benefits, the patient was deemed in                            satisfactory condition to undergo the procedure.                           After obtaining informed consent, the endoscope was  passed under direct vision. Throughout the                            procedure, the patient's blood pressure, pulse, and                            oxygen saturations were monitored continuously. The                            Endoscope was introduced through the mouth, and                            advanced to the second part of duodenum. The upper                            GI endoscopy was accomplished without  difficulty.                            The patient tolerated the procedure well. Scope In: Scope Out: Findings:                 LA Grade A (one or more mucosal breaks less than 5                            mm, not extending between tops of 2 mucosal folds)                            esophagitis was found at the gastroesophageal                            junction.                           A medium-sized hiatal hernia was present.                           The exam was otherwise without abnormality. Complications:            No immediate complications. Estimated blood loss:                            None. Estimated Blood Loss:     Estimated blood loss: none. Impression:               - LA Grade A reflux esophagitis.                           - Medium-sized hiatal hernia.                           - The examination was otherwise normal.                           - No specimens collected. Recommendation:           - Patient has a contact number available for  emergencies. The signs and symptoms of potential                            delayed complications were discussed with the                            patient. Return to normal activities tomorrow.                            Written discharge instructions were provided to the                            patient.                           - Resume previous diet.                           - Continue present medications: omeprazole 20mg                             pill before breakfast daily and ranitidine 150mg                             pill at bedtime nightly. Milus Banister, MD 01/29/2018 2:49:13 PM This report has been signed electronically.

## 2018-01-29 NOTE — Patient Instructions (Signed)
INFORMATION HIATAL HERNIA /GASTROESOPHAGEAL REFLUX GIVEN TO YOU TODAY   INFORMATION ON POLYPS AND DIVERTICULOSIS GIVEN TO YOU TODAY   AWAIT PATHOLOGY RESULTS IN A LETTER FROM DR JACOBS     YOU HAD AN ENDOSCOPIC PROCEDURE TODAY AT [facility]:   Refer to the procedure report that was given to you for any specific questions about what was found during the examination.  If the procedure report does not answer your questions, please call your gastroenterologist to clarify.  If you requested that your care partner not be given the details of your procedure findings, then the procedure report has been included in a sealed envelope for you to review at your convenience later.  YOU SHOULD EXPECT: Some feelings of bloating in the abdomen. Passage of more gas than usual.  Walking can help get rid of the air that was put into your GI tract during the procedure and reduce the bloating. If you had a lower endoscopy (such as a colonoscopy or flexible sigmoidoscopy) you may notice spotting of blood in your stool or on the toilet paper. If you underwent a bowel prep for your procedure, you may not have a normal bowel movement for a few days.  Please Note:  You might notice some irritation and congestion in your nose or some drainage.  This is from the oxygen used during your procedure.  There is no need for concern and it should clear up in a day or so.  SYMPTOMS TO REPORT IMMEDIATELY:   Following lower endoscopy (colonoscopy or flexible sigmoidoscopy):  Excessive amounts of blood in the stool  Significant tenderness or worsening of abdominal pains  Swelling of the abdomen that is new, acute  Fever of 100F or higher   Following upper endoscopy (EGD)  Vomiting of blood or coffee ground material  New chest pain or pain under the shoulder blades  Painful or persistently difficult swallowing  New shortness of breath  Fever of 100F or higher  Black, tarry-looking stools  For  urgent or emergent issues, a gastroenterologist can be reached at any hour by calling [phone].   DIET:  We do recommend a small meal at first, but then you may proceed to your regular diet.  Drink plenty of fluids but you should avoid alcoholic beverages for 24 hours.  ACTIVITY:  You should plan to take it easy for the rest of today and you should NOT DRIVE or use heavy machinery until tomorrow (because of the sedation medicines used during the test).    FOLLOW UP: Our staff will call the number listed on your records the next business day following your procedure to check on you and address any questions or concerns that you may have regarding the information given to you following your procedure. If we do not reach you, we will leave a message.  However, if you are feeling well and you are not experiencing any problems, there is no need to return our call.  We will assume that you have returned to your regular daily activities without incident.  If any biopsies were taken you will be contacted by phone or by letter within the next 1-3 weeks.  Please call us at [phone] if you have not heard about the biopsies in 3 weeks.    SIGNATURES/CONFIDENTIALITY: You and/or your care partner have signed paperwork which will be entered into your electronic medical record.  These signatures attest to the fact that that the information above on your After  Visit Summary has been reviewed and is understood.  Full responsibility of the confidentiality of this discharge information lies with you and/or your care-partner. 

## 2018-01-29 NOTE — Progress Notes (Signed)
Called to room to assist during endoscopic procedure.  Patient ID and intended procedure confirmed with present staff. Received instructions for my participation in the procedure from the performing physician.  

## 2018-01-29 NOTE — Op Note (Signed)
[facility] Patient Name: Bianca Parker Procedure Date: 01/29/2018 2:17 PM MRN: [id number] Endoscopist: Milus Banister , MD Age: 72 Referring MD:  Date of Birth: 27-May-1946 Gender: Female Account #: [id number] Procedure:                Colonoscopy Indications:              Follow-up of diverticulitis; colonoscopy 7/13 with                            HP only, also diverticulosis, repeat colon in 10                            years. Mother with colon cancer in her 74's. Acute                            diverticulitis 2018: Confirmed by CT scan                            (05/2017),PCP; cipro/flagyl Medicines:                Monitored Anesthesia Care Procedure:                Pre-Anesthesia Assessment:                           - Prior to the procedure, a History and Physical                            was performed, and patient medications and                            allergies were reviewed. The patient's tolerance of                            previous anesthesia was also reviewed. The risks                            and benefits of the procedure and the sedation                            options and risks were discussed with the patient.                            All questions were answered, and informed consent                            was obtained. Prior Anticoagulants: The patient has                            taken no previous anticoagulant or antiplatelet                            agents. ASA Grade Assessment: II - A patient with  mild systemic disease. After reviewing the risks                            and benefits, the patient was deemed in                            satisfactory condition to undergo the procedure.                           After obtaining informed consent, the colonoscope                            was passed under direct vision. Throughout the                            procedure, the patient's blood pressure,  pulse, and                            oxygen saturations were monitored continuously. The                            Colonoscope was introduced through the anus and                            advanced to the the cecum, identified by                            appendiceal orifice and ileocecal valve. The                            colonoscopy was performed without difficulty. The                            patient tolerated the procedure well. The quality                            of the bowel preparation was good. The ileocecal                            valve, appendiceal orifice, and rectum were                            photographed. Scope In: 2:22:17 PM Scope Out: 2:39:52 PM Scope Withdrawal Time: 0 hours 10 minutes 28 seconds  Total Procedure Duration: 0 hours 17 minutes 35 seconds  Findings:                 A 5 mm polyp was found in the ascending colon. The                            polyp was sessile. The polyp was removed with a                            cold snare. Resection and retrieval were complete.  Multiple small-mouthed diverticula were found in                            the left colon.                           The exam was otherwise without abnormality on                            direct and retroflexion views. Complications:            No immediate complications. Estimated blood loss:                            None. Estimated Blood Loss:     Estimated blood loss: none. Impression:               - One 5 mm polyp in the ascending colon, removed                            with a cold snare. Resected and retrieved.                           - Diverticulosis in the left colon.                           - The examination was otherwise normal on direct                            and retroflexion views. Recommendation:           - Patient has a contact number available for                            emergencies. The signs and symptoms of  potential                            delayed complications were discussed with the                            patient. Return to normal activities tomorrow.                            Written discharge instructions were provided to the                            patient.                           - Resume previous diet.                           - Continue present medications.                           You will receive a letter within 2-3 weeks with the  pathology results and my final recommendations.                           If the polyp(s) is proven to be 'pre-cancerous' on                            pathology, you will need repeat colonoscopy in 5                            years. If the polyp(s) is NOT 'precancerous' on                            pathology then you should repeat colon cancer                            screening in 10 years with colonoscopy without need                            for colon cancer screening by any method prior to                            then (including stool testing). Milus Banister, MD 01/29/2018 2:47:10 PM This report has been signed electronically.

## 2018-01-29 NOTE — Progress Notes (Signed)
A/ox3 pleased with MAC, report to RN 

## 2018-02-01 ENCOUNTER — Telehealth: Payer: Self-pay

## 2018-02-01 NOTE — Telephone Encounter (Signed)
  Follow up Call-  Call back number 01/29/2018  Post procedure Call Back phone  # [phone]  Permission to leave phone message Yes  Some recent data might be hidden     Patient questions:  Do you have a fever, pain , or abdominal swelling? No. Pain Score  0 *  Have you tolerated food without any problems? Yes.    Have you been able to return to your normal activities? Yes.    Do you have any questions about your discharge instructions: Diet   No. Medications  No. Follow up visit  No.  Do you have questions or concerns about your Care? No.  Actions: * If pain score is 4 or above: No action needed, pain <4.  No problems noted per pt. maw

## 2018-02-04 ENCOUNTER — Encounter: Payer: Self-pay | Admitting: Gastroenterology

## 2018-02-04 ENCOUNTER — Telehealth: Payer: Self-pay

## 2018-02-04 NOTE — Telephone Encounter (Signed)
Called and given below message to nurse at [facility]. She verbalized understanding.

## 2018-02-04 NOTE — Telephone Encounter (Signed)
No changes

## 2018-02-04 NOTE — Telephone Encounter (Signed)
[place] at [facility] called and left message. Patient is scheduled for IVIG 6/18 with [facility].  Nurse called and asked do they continue with the same orders or any changes?

## 2018-02-05 ENCOUNTER — Ambulatory Visit: Payer: 59

## 2018-03-05 ENCOUNTER — Ambulatory Visit: Payer: 59

## 2018-03-18 ENCOUNTER — Telehealth: Payer: Self-pay | Admitting: Hematology and Oncology

## 2018-03-18 NOTE — Telephone Encounter (Signed)
Called patient regarding message left on voicemail.  Patient needed to reschedule 9/6 appt.  Made appt for 9/10.

## 2018-03-26 ENCOUNTER — Encounter: Payer: Self-pay | Admitting: Hematology and Oncology

## 2018-03-26 ENCOUNTER — Telehealth: Payer: Self-pay | Admitting: *Deleted

## 2018-03-26 MED ORDER — ACYCLOVIR 400 MG PO TABS: 400.0000 mg | ORAL_TABLET | Freq: Two times a day (BID) | ORAL | 3 refills | Status: DC

## 2018-03-26 NOTE — Telephone Encounter (Signed)
LM that 90 day supply of acyclovir was sent to [facility].

## 2018-03-30 ENCOUNTER — Telehealth: Payer: Self-pay | Admitting: Cardiology

## 2018-03-30 NOTE — Telephone Encounter (Signed)
New message:      Pt would like to be released from the care of Dr. Ellyn Hack into the care of Dr.Skains upon release of Ellyn Hack. Patient states that is not because of care Dr. Ellyn Hack but she would just like for her and her husband to see the same Dr.

## 2018-03-30 NOTE — Telephone Encounter (Signed)
Dr. Ellyn Hack out of the office for two weeks.

## 2018-03-31 NOTE — Telephone Encounter (Signed)
Ok with me Tatiana Courter, MD  

## 2018-04-01 NOTE — Telephone Encounter (Signed)
Spoke to patient.  Patient aware Dr Ellyn Hack OUT OF TOWN.  Informed Patient okay to schedule with Dr Marlou Porch. PATIENT VERBALIZED UNDERSTANDING. AWARE WILL LET SCHEDULER TO CONTACT PATIENT - LAST APPT WITH DR Ellyn Hack 2017.

## 2018-04-02 ENCOUNTER — Ambulatory Visit: Payer: 59

## 2018-04-14 ENCOUNTER — Encounter: Payer: Self-pay | Admitting: *Deleted

## 2018-04-14 ENCOUNTER — Ambulatory Visit (INDEPENDENT_AMBULATORY_CARE_PROVIDER_SITE_OTHER): Payer: 59 | Admitting: Cardiology

## 2018-04-14 ENCOUNTER — Encounter: Payer: Self-pay | Admitting: Cardiology

## 2018-04-14 VITALS — BP 138/40 | HR 94 | Ht 63.0 in | Wt 200.6 lb

## 2018-04-14 DIAGNOSIS — R079 Chest pain, unspecified: Secondary | ICD-10-CM

## 2018-04-14 DIAGNOSIS — R000 Tachycardia, unspecified: Secondary | ICD-10-CM

## 2018-04-14 NOTE — Progress Notes (Signed)
Cardiology Office Note:    Date:  04/14/2018   ID:  Bianca Parker, DOB 03/22/1946, MRN [id number]  PCP:  Haywood Pao, MD  Cardiologist:  No primary care provider on file.   Referring MD: Haywood Pao, MD     History of Present Illness:    Bianca Parker is a 72 y.o. female here for follow-up of rapid heart rate.  She has had CLL.  Very active.  Hiking.  [place] overexertion..  Almost passed out.  Exercise stress test was performed no ischemia.  Heart rate did increase to 177.  It did appear to be sinus tachycardia.  Gradual.  No further episodes of syncope bleeding orthopnea PND chest pain.  Under stress for year. Bianca Parker is husband, CABG, Gerhardt. Fuss then. He was acting differently. Patient of mine.   During episode light headed, anxious, (she is a Education officer, museum), vague chest pain.    Past Medical History:  Diagnosis Date  . Allergic rhinitis, cause unspecified   . Anemia   . CARCINOMA, SKIN, SQUAMOUS CELL   . Cataract    bilateral cataracts   . Chronic lymphoid leukemia in remission [facility]) 2002 dx, tx 2003   Diagnosed in July 2002. Started on clinical trial at [facility] in June 2003. Now she is reportedly in remission but has a very compromised immune system. She is intermittently been doing IVIG and then was restarted back in July 2016. Now she doesn't have a month.   . Dyslipidemia   . GERD (gastroesophageal reflux disease)   . Hepatitis A antibody positive 04/06/2012   Acute diarrheal illness & elevated liver function tests 02/18/12;   . Hyperlipidemia   . Hypogammaglobulinemia, acquired ([employer]) 11/18/2011   Due to CLL and prior chemo  . Rectal bleeding 07/24/2015  . Thyroid disease     Past Surgical History:  Procedure Laterality Date  . ABDOMINAL HYSTERECTOMY  1966  . BUNIONECTOMY    . SIGMOIDOSCOPY    . SKIN BIOPSY    . TONSILLECTOMY      Current Medications: Current Meds  Medication Sig  .  acyclovir (ZOVIRAX) 400 MG tablet Take 1 tablet (400 mg total) by mouth 2 (two) times daily.  . Calcium Carbonate-Vitamin D (CALTRATE 600+D) 600-400 MG-UNIT per tablet Take 1 tablet by mouth daily.  . fexofenadine (ALLEGRA) 180 MG tablet Take 180 mg by mouth daily.    Marland Kitchen guaiFENesin (MUCINEX) 600 MG 12 hr tablet Take by mouth daily as needed. Reported on 08/24/2015  . levothyroxine (SYNTHROID, LEVOTHROID) 50 MCG tablet Take 50 mcg by mouth daily before breakfast.   . Multiple Vitamin (MULTIVITAMIN) tablet Take 1 tablet by mouth daily.  . mupirocin ointment (BACTROBAN) 2 % Apply 1 application topically as needed.   Marland Kitchen omeprazole (PRILOSEC) 20 MG capsule Take 20 tablets by mouth daily.  . psyllium (METAMUCIL) 58.6 % powder Take 1 packet by mouth daily.  . ranitidine (ZANTAC) 150 MG tablet Take 300 mg by mouth at bedtime.    Current Facility-Administered Medications for the 04/14/18 encounter (Office Visit) with Jerline Pain, MD  Medication  . 0.9 %  sodium chloride infusion     Allergies:   Penicillins; Pentamidine; and Sulfonamide derivatives   Social History   Socioeconomic History  . Marital status: Married    Spouse name: Not on file  . Number of children: Not on file  . Years of education: Not on file  . Highest education  level: Not on file  Occupational History  . Not on file  Social Needs  . Financial resource strain: Not on file  . Food insecurity:    Worry: Not on file    Inability: Not on file  . Transportation needs:    Medical: Not on file    Non-medical: Not on file  Tobacco Use  . Smoking status: Never Smoker  . Smokeless tobacco: Never Used  . Tobacco comment: Married, lives with husband Gershon Crane), grown children. 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 Alcohol use: Yes    Comment: Rare  . Drug use: No  . Sexual activity: Yes  Lifestyle  . Physical activity:    Days per week: Not on file    Minutes per session: Not on file    . Stress: Not on file  Relationships  . Social connections:    Talks on phone: Not on file    Gets together: Not on file    Attends religious service: Not on file    Active member of club or organization: Not on file    Attends meetings of clubs or organizations: Not on file    Relationship status: Not on file  Other Topics Concern  . Not on file  Social History Narrative  . Not on file     Family History: The patient's family history includes Breast cancer in her other, paternal aunt, and paternal grandmother; Cancer in her maternal grandmother, mother, and paternal grandmother; Colon cancer in her mother; Ovarian cancer in her other.  ROS:   Please see the history of present illness.     All other systems reviewed and are negative.  EKGs/Labs/Other Studies Reviewed:    The following studies were reviewed today: Prior office notes, lab work, testing  EKG:  EKG is  ordered today.  The ekg ordered today demonstrates normal sinus rhythm no other abnormalities  Recent Labs: 11/16/2017: ALT 33; BUN 13; Creatinine 0.95; Hemoglobin 14.4; Platelets 149; Potassium 4.1; Sodium 141  Recent Lipid Panel    Component Value Date/Time   CHOL 229 (H) 06/14/2014 0733   TRIG 95 06/14/2014 0733   HDL 73 06/14/2014 0733   CHOLHDL 4 05/22/2014 0737   VLDL 21.0 05/22/2014 0737   LDLCALC 137 (H) 06/14/2014 0733   LDLDIRECT 176.8 11/12/2011 0737    Physical Exam:    VS:  BP (!) 138/40   Pulse 94   Ht 5\' 3"  (1.6 m)   Wt 200 lb 9.6 oz (91 kg)   BMI 35.53 kg/m     Wt Readings from Last 3 Encounters:  04/14/18 200 lb 9.6 oz (91 kg)  01/29/18 193 lb (87.5 kg)  12/07/17 193 lb 2 oz (87.6 kg)     GEN:  Well nourished, well developed in no acute distress, overweight HEENT: Normal NECK: No JVD; No carotid bruits LYMPHATICS: No lymphadenopathy CARDIAC: RRR, no murmurs, rubs, gallops RESPIRATORY:  Clear to auscultation without rales, wheezing or rhonchi  ABDOMEN: Soft, non-tender,  non-distended MUSCULOSKELETAL:  No edema; No deformity  SKIN: Warm and dry NEUROLOGIC:  Alert and oriented x 3 PSYCHIATRIC:  Normal affect   ASSESSMENT:    1. Exercise-induced tachycardia   2. Chest pain, unspecified type    PLAN:    In order of problems listed above:  Atypical chest pain - We will go ahead and place an order for a nuclear stress test, she should be able to walk the treadmill.  Could  be anxiety related.  Tachycardia -Stable.  EKG reassuring.  CLL -Stable.  Receives monthly IVIG at home.    Medication Adjustments/Labs and Tests Ordered: Current medicines are reviewed at length with the patient today.  Concerns regarding medicines are outlined above.  Orders Placed This Encounter  Procedures  . Myocardial Perfusion Imaging  . EKG 12-Lead   No orders of the defined types were placed in this encounter.   Patient Instructions  Medication Instructions:  The current medical regimen is effective;  continue present plan and medications.  Testing/Procedures: Your physician has requested that you have a myoview. For further information please visit [email or web address]. Please follow instruction sheet, as given.  Follow-Up: Follow up in 1 year with Dr. Marlou Porch.  You will receive a letter in the mail 2 months before you are due.  Please call us when you receive this letter to schedule your follow up appointment.  If you need a refill on your cardiac medications before your next appointment, please call your pharmacy.  Thank you for choosing [facility]!!        Signed, Candee Furbish, MD  04/14/2018 10:54 AM    [place]

## 2018-04-14 NOTE — Patient Instructions (Signed)
Medication Instructions:  The current medical regimen is effective;  continue present plan and medications.  Testing/Procedures: Your physician has requested that you have a myoview. For further information please visit [email or web address]. Please follow instruction sheet, as given.  Follow-Up: Follow up in 1 year with Dr. Marlou Porch.  You will receive a letter in the mail 2 months before you are due.  Please call us when you receive this letter to schedule your follow up appointment.  If you need a refill on your cardiac medications before your next appointment, please call your pharmacy.  Thank you for choosing [facility]!!

## 2018-04-15 ENCOUNTER — Telehealth (HOSPITAL_COMMUNITY): Payer: Self-pay | Admitting: *Deleted

## 2018-04-15 NOTE — Telephone Encounter (Signed)
Patient given detailed instructions per Myocardial Perfusion Study Information Sheet for the test on 04/19/18. Patient notified to arrive 15 minutes early and that it is imperative to arrive on time for appointment to keep from having the test rescheduled.  If you need to cancel or reschedule your appointment, please call the office within 24 hours of your appointment. . Patient verbalized understanding. Kirstie Peri

## 2018-04-19 ENCOUNTER — Ambulatory Visit (HOSPITAL_COMMUNITY): Payer: 59 | Attending: Cardiology

## 2018-04-19 DIAGNOSIS — R079 Chest pain, unspecified: Secondary | ICD-10-CM | POA: Diagnosis not present

## 2018-04-19 LAB — MYOCARDIAL PERFUSION IMAGING
CSEPED: 4 min
Estimated workload: 6.4 METS
Exercise duration (sec): 30 s
LHR: 0.34
LV dias vol: 63 mL (ref 46–106)
LV sys vol: 26 mL
MPHR: 149 {beats}/min
NUC STRESS TID: 0.81
Peak HR: 164 {beats}/min
Percent HR: 110 %
RPE: 18
Rest HR: 72 {beats}/min
SDS: 3
SRS: 8
SSS: 11

## 2018-04-19 MED ORDER — TECHNETIUM TC 99M TETROFOSMIN IV KIT
33.0000 | PACK | Freq: Once | INTRAVENOUS | Status: AC | PRN
  Administered 2018-04-19: 33 via INTRAVENOUS
  Filled 2018-04-19: qty 33

## 2018-04-19 MED ORDER — TECHNETIUM TC 99M TETROFOSMIN IV KIT
10.1000 | PACK | Freq: Once | INTRAVENOUS | Status: AC | PRN
  Administered 2018-04-19: 10.1 via INTRAVENOUS
  Filled 2018-04-19: qty 11

## 2018-04-30 ENCOUNTER — Ambulatory Visit: Payer: 59

## 2018-04-30 ENCOUNTER — Ambulatory Visit: Payer: 59 | Admitting: Hematology and Oncology

## 2018-04-30 ENCOUNTER — Other Ambulatory Visit: Payer: 59

## 2018-05-03 ENCOUNTER — Other Ambulatory Visit: Payer: Self-pay

## 2018-05-03 DIAGNOSIS — C9111 Chronic lymphocytic leukemia of B-cell type in remission: Secondary | ICD-10-CM

## 2018-05-04 ENCOUNTER — Inpatient Hospital Stay: Payer: 59 | Attending: Hematology and Oncology

## 2018-05-04 ENCOUNTER — Inpatient Hospital Stay (HOSPITAL_BASED_OUTPATIENT_CLINIC_OR_DEPARTMENT_OTHER): Payer: 59 | Admitting: Hematology and Oncology

## 2018-05-04 ENCOUNTER — Telehealth: Payer: Self-pay | Admitting: Hematology and Oncology

## 2018-05-04 ENCOUNTER — Encounter: Payer: Self-pay | Admitting: Hematology and Oncology

## 2018-05-04 DIAGNOSIS — D801 Nonfamilial hypogammaglobulinemia: Secondary | ICD-10-CM | POA: Insufficient documentation

## 2018-05-04 DIAGNOSIS — Z85828 Personal history of other malignant neoplasm of skin: Secondary | ICD-10-CM | POA: Diagnosis not present

## 2018-05-04 DIAGNOSIS — C9111 Chronic lymphocytic leukemia of B-cell type in remission: Secondary | ICD-10-CM | POA: Insufficient documentation

## 2018-05-04 LAB — CMP (CANCER CENTER ONLY)
ALT: 30 U/L (ref 0–44)
ANION GAP: 8 (ref 5–15)
AST: 33 U/L (ref 15–41)
Albumin: 3.8 g/dL (ref 3.5–5.0)
Alkaline Phosphatase: 72 U/L (ref 38–126)
BILIRUBIN TOTAL: 0.5 mg/dL (ref 0.3–1.2)
BUN: 12 mg/dL (ref 8–23)
CO2: 28 mmol/L (ref 22–32)
Calcium: 9.8 mg/dL (ref 8.9–10.3)
Chloride: 108 mmol/L (ref 98–111)
Creatinine: 1.06 mg/dL — ABNORMAL HIGH (ref 0.44–1.00)
GFR, EST AFRICAN AMERICAN: 60 mL/min — AB (ref >60)
GFR, EST NON AFRICAN AMERICAN: 52 mL/min — AB (ref >60)
Glucose, Bld: 102 mg/dL — ABNORMAL HIGH (ref 70–99)
POTASSIUM: 4.3 mmol/L (ref 3.5–5.1)
Sodium: 144 mmol/L (ref 135–145)
TOTAL PROTEIN: 7.8 g/dL (ref 6.5–8.1)

## 2018-05-04 LAB — CBC WITH DIFFERENTIAL (CANCER CENTER ONLY)
BASOS ABS: 0 10*3/uL (ref 0.0–0.1)
Basophils Relative: 0 %
Eosinophils Absolute: 0 10*3/uL (ref 0.0–0.5)
Eosinophils Relative: 1 %
HEMATOCRIT: 42.9 % (ref 34.8–46.6)
Hemoglobin: 14.2 g/dL (ref 11.6–15.9)
LYMPHS PCT: 34 %
Lymphs Abs: 1.5 10*3/uL (ref 0.9–3.3)
MCH: 28.8 pg (ref 25.1–34.0)
MCHC: 33.1 g/dL (ref 31.5–36.0)
MCV: 86.8 fL (ref 79.5–101.0)
Monocytes Absolute: 0.3 10*3/uL (ref 0.1–0.9)
Monocytes Relative: 7 %
NEUTROS ABS: 2.5 10*3/uL (ref 1.5–6.5)
NEUTROS PCT: 58 %
Platelet Count: 145 10*3/uL (ref 145–400)
RBC: 4.94 MIL/uL (ref 3.70–5.45)
RDW: 14.6 % — ABNORMAL HIGH (ref 11.2–14.5)
WBC: 4.3 10*3/uL (ref 3.9–10.3)

## 2018-05-04 NOTE — Assessment & Plan Note (Signed)
This is related to long-term side effects from prior chemotherapy. She has recurrent infection with herpesvirus, bronchitis, tooth abscess and skin infections in the past She is already on chronic treatment with Valtrex daily. She is doing well and will continue IVIG treatment at 1 g/kg every [redacted] weeks along with premedication with Benadryl, Tylenol and Solu-Medrol. The risk, benefit, side effects of IVIG is fully discussed with the patient and she agreed to proceed. She denies recent serum sickness She is doing well with home IVIG infusion and we will continue the next 6 months.

## 2018-05-04 NOTE — Telephone Encounter (Signed)
Per 9/10 los patient decline avs and calendar

## 2018-05-04 NOTE — Assessment & Plan Note (Signed)
Clinically, she remained in remission. I will continue with annual follow-up with history, physical examination and blood work.

## 2018-05-04 NOTE — Progress Notes (Signed)
[place] OFFICE PROGRESS NOTE  Patient Care Team: Tisovec, Fransico Him, MD as PCP - General (Internal Medicine) Deneise Lever, MD as Referring Physician (Pulmonary Disease) Rozetta Nunnery, MD as Referring Physician (Otolaryngology) Milus Banister, MD (Gastroenterology) Arvella Nigh, MD (Obstetrics and Gynecology) Heath Lark, MD as Consulting Physician (Hematology and Oncology) Jari Pigg, MD as Consulting Physician (Dermatology) Marygrace Drought, MD as Consulting Physician (Ophthalmology)  ASSESSMENT & PLAN:  Chronic lymphoid leukemia in remission Clinically, she remained in remission. I will continue with annual follow-up with history, physical examination and blood work.   Hypogammaglobulinemia, acquired This is related to long-term side effects from prior chemotherapy. She has recurrent infection with herpesvirus, bronchitis, tooth abscess and skin infections in the past She is already on chronic treatment with Valtrex daily. She is doing well and will continue IVIG treatment at 1 g/kg every [redacted] weeks along with premedication with Benadryl, Tylenol and Solu-Medrol. The risk, benefit, side effects of IVIG is fully discussed with the patient and she agreed to proceed. She denies recent serum sickness She is doing well with home IVIG infusion and we will continue the next 6 months.  History of skin cancer She had history of skin cancer and is at risk of skin cancer She is being monitored closely by dermatologist We discussed the importance of skin protection with sunscreen and avoidance of excessive sun exposure   No orders of the defined types were placed in this encounter.   INTERVAL HISTORY: Please see below for problem oriented charting. She tolerated home IVIG infusion well No serum sickness She denies recent infection, fever or chills No new lymphadenopathy.  She feels well and is pleased with home IVIG infusion.  She would like to continue  the same. She saw her dermatologist recently.  She denies recent skin cancer.  SUMMARY OF ONCOLOGIC HISTORY:  She was in one of the first cohorts of patients treated with FCR on protocol at the [facility] at time of her initial diagnosis in July 2002. Technically she had RAI  stage III disease due to anemia but she had no organomegaly or lymphadenopathy on exam. FCR was started in 12/2001, and continued through 06/2002. She achieved a complete hematologic response and,which has been durable now for over 11 years!  She did develop hypogammaglobulinemia. As a result, she gets recurrent bronchitis and sinusitis. In April 2013 she was started her on monthly intravenous immunoglobulin 400 mg per kilogram and this has significantly impacted on the frequency and severity of her infections. This was continued through April of 2014. She has had no new infections since stopping the IVIG but as expected, immunoglobulin levels have fallen to pretreatment levels.   She has a history of her recurrent squamous cell carcinomas of her skin and has had 2 lesions excised from her left tibial area and another 1 excised from the vulvar area. Recently, she had a basal cell carcinoma removed. She has a history of both herpes zoster and herpes simplex infections. Recently, she had abnormal Pap smear and currently on close observation. Recent colposcopy was negative.  She received IVIG treatment for acquired hypogammaglobulinemia but stopped in April 2014 due to sick serum syndrome In July 2016, IVIG is resumed with premedication In August 2016, she developed cellulitis from venipuncture, resolved with antibiotics From 2016 to 2019, she continues to have recurrent infection requiring antibiotic therapy  REVIEW OF SYSTEMS:   Constitutional: Denies fevers, chills or abnormal weight loss Eyes: Denies blurriness of  vision Ears, nose, mouth, throat, and face: Denies mucositis or sore throat Respiratory: Denies  cough, dyspnea or wheezes Cardiovascular: Denies palpitation, chest discomfort or lower extremity swelling Gastrointestinal:  Denies nausea, heartburn or change in bowel habits Skin: Denies abnormal skin rashes Lymphatics: Denies new lymphadenopathy or easy bruising Neurological:Denies numbness, tingling or new weaknesses Behavioral/Psych: Mood is stable, no new changes  All other systems were reviewed with the patient and are negative.  I have reviewed the past medical history, past surgical history, social history and family history with the patient and they are unchanged from previous note.  ALLERGIES:  is allergic to penicillins; pentamidine; and sulfonamide derivatives.  MEDICATIONS:  Current Outpatient Medications  Medication Sig Dispense Refill  . acyclovir (ZOVIRAX) 400 MG tablet Take 1 tablet (400 mg total) by mouth 2 (two) times daily. 180 tablet 3  . Calcium Carbonate-Vitamin D (CALTRATE 600+D) 600-400 MG-UNIT per tablet Take 1 tablet by mouth daily.    . fexofenadine (ALLEGRA) 180 MG tablet Take 180 mg by mouth daily.      Marland Kitchen guaiFENesin (MUCINEX) 600 MG 12 hr tablet Take by mouth daily as needed. Reported on 08/24/2015    . levothyroxine (SYNTHROID, LEVOTHROID) 50 MCG tablet Take 50 mcg by mouth daily before breakfast.     . Multiple Vitamin (MULTIVITAMIN) tablet Take 1 tablet by mouth daily.    . mupirocin ointment (BACTROBAN) 2 % Apply 1 application topically as needed.     Marland Kitchen omeprazole (PRILOSEC) 20 MG capsule Take 20 tablets by mouth daily.    . psyllium (METAMUCIL) 58.6 % powder Take 1 packet by mouth daily.    . ranitidine (ZANTAC) 150 MG tablet Take 300 mg by mouth at bedtime.      Current Facility-Administered Medications  Medication Dose Route Frequency Provider Last Rate Last Dose  . 0.9 %  sodium chloride infusion  500 mL Intravenous Once Milus Banister, MD       Facility-Administered Medications Ordered in Other Visits  Medication Dose Route Frequency  Provider Last Rate Last Dose  . methylPREDNISolone sodium succinate (SOLU-MEDROL) 40 mg/mL injection 20 mg  20 mg Intravenous Q30 days Heath Lark, MD   20 mg at 02/09/17 0920    PHYSICAL EXAMINATION: ECOG PERFORMANCE STATUS: 0 - Asymptomatic  Vitals:   05/04/18 0947  BP: (!) 149/83  Pulse: 75  Resp: 18  Temp: 98.1 F (36.7 C)  SpO2: 98%   Filed Weights   05/04/18 0947  Weight: 201 lb 3.2 oz (91.3 kg)    GENERAL:alert, no distress and comfortable SKIN: skin color, texture, turgor are normal, no rashes or significant lesions EYES: normal, Conjunctiva are pink and non-injected, sclera clear OROPHARYNX:no exudate, no erythema and lips, buccal mucosa, and tongue normal  NECK: supple, thyroid normal size, non-tender, without nodularity LYMPH:  no palpable lymphadenopathy in the cervical, axillary or inguinal LUNGS: clear to auscultation and percussion with normal breathing effort HEART: regular rate & rhythm and no murmurs and no lower extremity edema ABDOMEN:abdomen soft, non-tender and normal bowel sounds Musculoskeletal:no cyanosis of digits and no clubbing  NEURO: alert & oriented x 3 with fluent speech, no focal motor/sensory deficits  LABORATORY DATA:  I have reviewed the data as listed    Component Value Date/Time   NA 144 05/04/2018 0922   NA 142 06/01/2017 0756   K 4.3 05/04/2018 0922   K 4.2 06/01/2017 0756   CL 108 05/04/2018 0922   CL 109 (H) 01/20/2013 0801   CO2 28  05/04/2018 0922   CO2 21 (L) 06/01/2017 0756   GLUCOSE 102 (H) 05/04/2018 0922   GLUCOSE 88 06/01/2017 0756   GLUCOSE 89 01/20/2013 0801   BUN 12 05/04/2018 0922   BUN 14.5 06/01/2017 0756   CREATININE 1.06 (H) 05/04/2018 0922   CREATININE 1.0 06/01/2017 0756   CALCIUM 9.8 05/04/2018 0922   CALCIUM 10.1 06/01/2017 0756   PROT 7.8 05/04/2018 0922   PROT 7.7 06/01/2017 0756   ALBUMIN 3.8 05/04/2018 0922   ALBUMIN 3.9 06/01/2017 0756   AST 33 05/04/2018 0922   AST 35 (H) 06/01/2017 0756    ALT 30 05/04/2018 0922   ALT 28 06/01/2017 0756   ALKPHOS 72 05/04/2018 0922   ALKPHOS 52 06/01/2017 0756   BILITOT 0.5 05/04/2018 0922   BILITOT 0.58 06/01/2017 0756   GFRNONAA 52 (L) 05/04/2018 0922   GFRAA 60 (L) 05/04/2018 0922    No results found for: SPEP, UPEP  Lab Results  Component Value Date   WBC 4.3 05/04/2018   NEUTROABS 2.5 05/04/2018   HGB 14.2 05/04/2018   HCT 42.9 05/04/2018   MCV 86.8 05/04/2018   PLT 145 05/04/2018      Chemistry      Component Value Date/Time   NA 144 05/04/2018 0922   NA 142 06/01/2017 0756   K 4.3 05/04/2018 0922   K 4.2 06/01/2017 0756   CL 108 05/04/2018 0922   CL 109 (H) 01/20/2013 0801   CO2 28 05/04/2018 0922   CO2 21 (L) 06/01/2017 0756   BUN 12 05/04/2018 0922   BUN 14.5 06/01/2017 0756   CREATININE 1.06 (H) 05/04/2018 0922   CREATININE 1.0 06/01/2017 0756   GLU 92 06/17/2013      Component Value Date/Time   CALCIUM 9.8 05/04/2018 0922   CALCIUM 10.1 06/01/2017 0756   ALKPHOS 72 05/04/2018 0922   ALKPHOS 52 06/01/2017 0756   AST 33 05/04/2018 0922   AST 35 (H) 06/01/2017 0756   ALT 30 05/04/2018 0922   ALT 28 06/01/2017 0756   BILITOT 0.5 05/04/2018 0922   BILITOT 0.58 06/01/2017 0756      All questions were answered. The patient knows to call the clinic with any problems, questions or concerns. No barriers to learning was detected.  I spent 12 minutes counseling the patient face to face. The total time spent in the appointment was 15 minutes and more than 50% was on counseling and review of test results  Heath Lark, MD 05/04/2018 10:18 AM

## 2018-05-04 NOTE — Assessment & Plan Note (Signed)
She had history of skin cancer and is at risk of skin cancer She is being monitored closely by dermatologist We discussed the importance of skin protection with sunscreen and avoidance of excessive sun exposure

## 2018-05-05 LAB — IGA: IgA: <5 mg/dL — ABNORMAL LOW (ref 64–422)

## 2018-05-05 LAB — IGG: IgG (Immunoglobin G), Serum: 1569 mg/dL (ref 700–1600)

## 2018-05-05 LAB — IGM

## 2018-05-18 ENCOUNTER — Other Ambulatory Visit: Payer: Self-pay | Admitting: Obstetrics and Gynecology

## 2018-05-18 DIAGNOSIS — Z1231 Encounter for screening mammogram for malignant neoplasm of breast: Secondary | ICD-10-CM

## 2018-06-28 ENCOUNTER — Ambulatory Visit
Admission: RE | Admit: 2018-06-28 | Discharge: 2018-06-28 | Disposition: A | Discharge status: Home / Self Care | Payer: 59 | Source: Ambulatory Visit | Attending: Obstetrics and Gynecology | Admitting: Obstetrics and Gynecology

## 2018-06-28 DIAGNOSIS — Z1231 Encounter for screening mammogram for malignant neoplasm of breast: Secondary | ICD-10-CM

## 2018-09-22 ENCOUNTER — Encounter: Payer: Self-pay | Admitting: Hematology and Oncology

## 2018-09-22 ENCOUNTER — Other Ambulatory Visit: Payer: Self-pay | Admitting: Hematology and Oncology

## 2018-09-22 DIAGNOSIS — D801 Nonfamilial hypogammaglobulinemia: Secondary | ICD-10-CM

## 2018-09-22 DIAGNOSIS — J328 Other chronic sinusitis: Secondary | ICD-10-CM

## 2018-09-22 DIAGNOSIS — C9111 Chronic lymphocytic leukemia of B-cell type in remission: Secondary | ICD-10-CM

## 2018-09-27 ENCOUNTER — Telehealth: Payer: Self-pay | Admitting: Hematology and Oncology

## 2018-09-27 ENCOUNTER — Encounter: Payer: Self-pay | Admitting: Hematology and Oncology

## 2018-09-27 NOTE — Telephone Encounter (Signed)
Scheduled appt per 1/29 sch message - pt is awre of appt for f/u will get an updated schedule when she comes in for IVIG - not scheduled due to cap

## 2018-09-28 ENCOUNTER — Telehealth: Payer: Self-pay

## 2018-09-28 ENCOUNTER — Telehealth: Payer: Self-pay | Admitting: Hematology and Oncology

## 2018-09-28 NOTE — Telephone Encounter (Signed)
-----   Message from Heath Lark, MD sent at 09/28/2018  8:10 AM EST ----- Regarding: appt this week If I recalled correctly, she is not at 5 weeks yet. SHe is scheduled to see me tomorrow. Her labs will still show high level IgG. I need you to call her and reschedule, 5 weeks from last infusion for labs and see me

## 2018-09-28 NOTE — Telephone Encounter (Signed)
Called and given below message. She verbalized understanding. 5 weeks will be 2/13 since last IVIG. Scheduling message sent.

## 2018-09-28 NOTE — Telephone Encounter (Signed)
Scheduled appt per 12/9 sch message pt is aware of appt date and time   

## 2018-09-29 ENCOUNTER — Other Ambulatory Visit: Payer: 59

## 2018-09-29 ENCOUNTER — Ambulatory Visit: Payer: 59 | Admitting: Hematology and Oncology

## 2018-10-07 ENCOUNTER — Inpatient Hospital Stay: Payer: Managed Care, Other (non HMO) | Attending: Hematology and Oncology | Admitting: Hematology and Oncology

## 2018-10-07 ENCOUNTER — Inpatient Hospital Stay: Payer: Managed Care, Other (non HMO)

## 2018-10-07 DIAGNOSIS — C9111 Chronic lymphocytic leukemia of B-cell type in remission: Secondary | ICD-10-CM | POA: Diagnosis not present

## 2018-10-07 DIAGNOSIS — Z79899 Other long term (current) drug therapy: Secondary | ICD-10-CM | POA: Diagnosis not present

## 2018-10-07 DIAGNOSIS — D649 Anemia, unspecified: Secondary | ICD-10-CM | POA: Diagnosis not present

## 2018-10-07 DIAGNOSIS — J328 Other chronic sinusitis: Secondary | ICD-10-CM

## 2018-10-07 DIAGNOSIS — D801 Nonfamilial hypogammaglobulinemia: Secondary | ICD-10-CM | POA: Insufficient documentation

## 2018-10-07 DIAGNOSIS — Z85828 Personal history of other malignant neoplasm of skin: Secondary | ICD-10-CM | POA: Diagnosis not present

## 2018-10-07 DIAGNOSIS — J329 Chronic sinusitis, unspecified: Secondary | ICD-10-CM | POA: Diagnosis not present

## 2018-10-07 DIAGNOSIS — R0981 Nasal congestion: Secondary | ICD-10-CM | POA: Insufficient documentation

## 2018-10-07 LAB — COMPREHENSIVE METABOLIC PANEL
ALT: 60 U/L — ABNORMAL HIGH (ref 0–44)
AST: 49 U/L — ABNORMAL HIGH (ref 15–41)
Albumin: 4.3 g/dL (ref 3.5–5.0)
Alkaline Phosphatase: 75 U/L (ref 38–126)
Anion gap: 10 (ref 5–15)
BUN: 15 mg/dL (ref 8–23)
CO2: 27 mmol/L (ref 22–32)
Calcium: 10.2 mg/dL (ref 8.9–10.3)
Chloride: 107 mmol/L (ref 98–111)
Creatinine, Ser: 1.1 mg/dL — ABNORMAL HIGH (ref 0.44–1.00)
GFR calc Af Amer: 58 mL/min — ABNORMAL LOW (ref >60)
GFR calc non Af Amer: 50 mL/min — ABNORMAL LOW (ref >60)
Glucose, Bld: 101 mg/dL — ABNORMAL HIGH (ref 70–99)
Potassium: 4.3 mmol/L (ref 3.5–5.1)
Sodium: 144 mmol/L (ref 135–145)
Total Bilirubin: 0.6 mg/dL (ref 0.3–1.2)
Total Protein: 7.8 g/dL (ref 6.5–8.1)

## 2018-10-07 LAB — CBC WITH DIFFERENTIAL/PLATELET
Abs Immature Granulocytes: 0.02 10*3/uL (ref 0.00–0.07)
Basophils Absolute: 0 10*3/uL (ref 0.0–0.1)
Basophils Relative: 0 %
Eosinophils Absolute: 0.1 10*3/uL (ref 0.0–0.5)
Eosinophils Relative: 1 %
HCT: 46.4 % — ABNORMAL HIGH (ref 36.0–46.0)
HEMOGLOBIN: 14.7 g/dL (ref 12.0–15.0)
Immature Granulocytes: 0 %
Lymphocytes Relative: 39 %
Lymphs Abs: 2.2 10*3/uL (ref 0.7–4.0)
MCH: 28.4 pg (ref 26.0–34.0)
MCHC: 31.7 g/dL (ref 30.0–36.0)
MCV: 89.6 fL (ref 80.0–100.0)
Monocytes Absolute: 0.5 10*3/uL (ref 0.1–1.0)
Monocytes Relative: 8 %
Neutro Abs: 2.9 10*3/uL (ref 1.7–7.7)
Neutrophils Relative %: 52 %
Platelets: 155 10*3/uL (ref 150–400)
RBC: 5.18 MIL/uL — ABNORMAL HIGH (ref 3.87–5.11)
RDW: 14.1 % (ref 11.5–15.5)
WBC: 5.6 10*3/uL (ref 4.0–10.5)
nRBC: 0 % (ref 0.0–0.2)

## 2018-10-08 ENCOUNTER — Telehealth: Payer: Self-pay | Admitting: Hematology and Oncology

## 2018-10-08 ENCOUNTER — Ambulatory Visit: Payer: Self-pay

## 2018-10-08 ENCOUNTER — Encounter: Payer: Self-pay | Admitting: Hematology and Oncology

## 2018-10-08 LAB — IGG, IGA, IGM
IgA: <5 mg/dL — ABNORMAL LOW (ref 64–422)
IgG (Immunoglobin G), Serum: 1236 mg/dL (ref 700–1600)
IgM (Immunoglobulin M), Srm: <5 mg/dL — ABNORMAL LOW (ref 26–217)

## 2018-10-08 NOTE — Telephone Encounter (Signed)
No los °

## 2018-10-08 NOTE — Progress Notes (Signed)
[place] OFFICE PROGRESS NOTE  Patient Care Team: Tisovec, Fransico Him, MD as PCP - General (Internal Medicine) Deneise Lever, MD as Referring Physician (Pulmonary Disease) Rozetta Nunnery, MD as Referring Physician (Otolaryngology) Milus Banister, MD (Gastroenterology) Arvella Nigh, MD (Obstetrics and Gynecology) Heath Lark, MD as Consulting Physician (Hematology and Oncology) Jari Pigg, MD as Consulting Physician (Dermatology) Marygrace Drought, MD as Consulting Physician (Ophthalmology)  ASSESSMENT & PLAN:  Chronic lymphoid leukemia in remission She has no signs or symptoms of CLL recurrence We will continue close monitoring  Hypogammaglobulinemia, acquired The patient tolerated intravenous immunoglobulin treatment well at home. However, due to changes in insurance policy, at present time, she does not qualify for home infusion Repeat immunoglobulin level come back within normal range but could be related to recent infusion We will recheck her blood count again in 2 weeks If her serum IgG level is low, we will obtain new insurance prior authorization to approve her treatment.  She agreed with the plan of care  RHINOSINUSITIS, CHRONIC She complained of significant chronic sinus congestion and recurrent sinus infection With IVIG treatment, her severe infection is less For now, she will continue conservative management and antibiotics treatment as needed   No orders of the defined types were placed in this encounter.   INTERVAL HISTORY: Please see below for problem oriented charting. She returns for further follow-up Since she switch her insurance, she was not able to get IVIG at home She continues to have recurrent sinus congestion and intermittent doses of antibiotics. She has mild cough due to sinus drainage No sore throat No new lymphadenopathy She denies infusion reaction at home  SUMMARY OF ONCOLOGIC HISTORY:  She was in one of the first  cohorts of patients treated with FCR on protocol at the [facility] at time of her initial diagnosis in July 2002. Technically she had RAI  stage III disease due to anemia but she had no organomegaly or lymphadenopathy on exam. FCR was started in 12/2001, and continued through 06/2002. She achieved a complete hematologic response and,which has been durable now for over 11 years!  She did develop hypogammaglobulinemia. As a result, she gets recurrent bronchitis and sinusitis. In April 2013 she was started her on monthly intravenous immunoglobulin 400 mg per kilogram and this has significantly impacted on the frequency and severity of her infections. This was continued through April of 2014. She has had no new infections since stopping the IVIG but as expected, immunoglobulin levels have fallen to pretreatment levels.   She has a history of her recurrent squamous cell carcinomas of her skin and has had 2 lesions excised from her left tibial area and another 1 excised from the vulvar area. Recently, she had a basal cell carcinoma removed. She has a history of both herpes zoster and herpes simplex infections. Recently, she had abnormal Pap smear and currently on close observation. Recent colposcopy was negative.  She received IVIG treatment for acquired hypogammaglobulinemia but stopped in April 2014 due to sick serum syndrome In July 2016, IVIG is resumed with premedication In August 2016, she developed cellulitis from venipuncture, resolved with antibiotics From 2016 to 2019, she continues to have recurrent infection requiring antibiotic therapy.  She is also receiving IVIG treatment that helps reduce the risk of severe infection.  REVIEW OF SYSTEMS:   Constitutional: Denies fevers, chills or abnormal weight loss Eyes: Denies blurriness of vision Ears, nose, mouth, throat, and face: Denies mucositis or sore throat Respiratory:  Denies cough, dyspnea or wheezes Cardiovascular: Denies  palpitation, chest discomfort or lower extremity swelling Gastrointestinal:  Denies nausea, heartburn or change in bowel habits Skin: Denies abnormal skin rashes Lymphatics: Denies new lymphadenopathy or easy bruising Neurological:Denies numbness, tingling or new weaknesses Behavioral/Psych: Mood is stable, no new changes  All other systems were reviewed with the patient and are negative.  I have reviewed the past medical history, past surgical history, social history and family history with the patient and they are unchanged from previous note.  ALLERGIES:  is allergic to penicillins; pentamidine; and sulfonamide derivatives.  MEDICATIONS:  Current Outpatient Medications  Medication Sig Dispense Refill  . atorvastatin (LIPITOR) 10 MG tablet Take 10 mg by mouth daily.    Marland Kitchen omeprazole (PRILOSEC) 20 MG capsule Take 20 mg by mouth daily.    Marland Kitchen acyclovir (ZOVIRAX) 400 MG tablet Take 1 tablet (400 mg total) by mouth 2 (two) times daily. 180 tablet 3  . Calcium Carbonate-Vitamin D (CALTRATE 600+D) 600-400 MG-UNIT per tablet Take 1 tablet by mouth daily.    . fexofenadine (ALLEGRA) 180 MG tablet Take 180 mg by mouth daily.      Marland Kitchen levothyroxine (SYNTHROID, LEVOTHROID) 50 MCG tablet Take 50 mcg by mouth daily before breakfast.     . Multiple Vitamin (MULTIVITAMIN) tablet Take 1 tablet by mouth daily.    . mupirocin ointment (BACTROBAN) 2 % Apply 1 application topically as needed.     . psyllium (METAMUCIL) 58.6 % powder Take 1 packet by mouth daily.     Current Facility-Administered Medications  Medication Dose Route Frequency Provider Last Rate Last Dose  . 0.9 %  sodium chloride infusion  500 mL Intravenous Once Milus Banister, MD       Facility-Administered Medications Ordered in Other Visits  Medication Dose Route Frequency Provider Last Rate Last Dose  . methylPREDNISolone sodium succinate (SOLU-MEDROL) 40 mg/mL injection 20 mg  20 mg Intravenous Q30 days Heath Lark, MD   20 mg at  02/09/17 0920    PHYSICAL EXAMINATION: ECOG PERFORMANCE STATUS: 1 - Symptomatic but completely ambulatory  Vitals:   10/07/18 1304  BP: (!) 161/80  Pulse: 98  Resp: 20  Temp: 98.3 F (36.8 C)  SpO2: 99%   Filed Weights   10/07/18 1304  Weight: 210 lb 9.6 oz (95.5 kg)    GENERAL:alert, no distress and comfortable SKIN: skin color, texture, turgor are normal, no rashes or significant lesions EYES: normal, Conjunctiva are pink and non-injected, sclera clear OROPHARYNX:no exudate, no erythema and lips, buccal mucosa, and tongue normal  NECK: supple, thyroid normal size, non-tender, without nodularity LYMPH:  no palpable lymphadenopathy in the cervical, axillary or inguinal LUNGS: clear to auscultation and percussion with normal breathing effort HEART: regular rate & rhythm and no murmurs and no lower extremity edema ABDOMEN:abdomen soft, non-tender and normal bowel sounds Musculoskeletal:no cyanosis of digits and no clubbing  NEURO: alert & oriented x 3 with fluent speech, no focal motor/sensory deficits  LABORATORY DATA:  I have reviewed the data as listed    Component Value Date/Time   NA 144 10/07/2018 1237   NA 142 06/01/2017 0756   K 4.3 10/07/2018 1237   K 4.2 06/01/2017 0756   CL 107 10/07/2018 1237   CL 109 (H) 01/20/2013 0801   CO2 27 10/07/2018 1237   CO2 21 (L) 06/01/2017 0756   GLUCOSE 101 (H) 10/07/2018 1237   GLUCOSE 88 06/01/2017 0756   GLUCOSE 89 01/20/2013 0801   BUN 15  10/07/2018 1237   BUN 14.5 06/01/2017 0756   CREATININE 1.10 (H) 10/07/2018 1237   CREATININE 1.06 (H) 05/04/2018 0922   CREATININE 1.0 06/01/2017 0756   CALCIUM 10.2 10/07/2018 1237   CALCIUM 10.1 06/01/2017 0756   PROT 7.8 10/07/2018 1237   PROT 7.7 06/01/2017 0756   ALBUMIN 4.3 10/07/2018 1237   ALBUMIN 3.9 06/01/2017 0756   AST 49 (H) 10/07/2018 1237   AST 33 05/04/2018 0922   AST 35 (H) 06/01/2017 0756   ALT 60 (H) 10/07/2018 1237   ALT 30 05/04/2018 0922   ALT 28  06/01/2017 0756   ALKPHOS 75 10/07/2018 1237   ALKPHOS 52 06/01/2017 0756   BILITOT 0.6 10/07/2018 1237   BILITOT 0.5 05/04/2018 0922   BILITOT 0.58 06/01/2017 0756   GFRNONAA 50 (L) 10/07/2018 1237   GFRNONAA 52 (L) 05/04/2018 0922   GFRAA 58 (L) 10/07/2018 1237   GFRAA 60 (L) 05/04/2018 0922    No results found for: SPEP, UPEP  Lab Results  Component Value Date   WBC 5.6 10/07/2018   NEUTROABS 2.9 10/07/2018   HGB 14.7 10/07/2018   HCT 46.4 (H) 10/07/2018   MCV 89.6 10/07/2018   PLT 155 10/07/2018      Chemistry      Component Value Date/Time   NA 144 10/07/2018 1237   NA 142 06/01/2017 0756   K 4.3 10/07/2018 1237   K 4.2 06/01/2017 0756   CL 107 10/07/2018 1237   CL 109 (H) 01/20/2013 0801   CO2 27 10/07/2018 1237   CO2 21 (L) 06/01/2017 0756   BUN 15 10/07/2018 1237   BUN 14.5 06/01/2017 0756   CREATININE 1.10 (H) 10/07/2018 1237   CREATININE 1.06 (H) 05/04/2018 0922   CREATININE 1.0 06/01/2017 0756   GLU 92 06/17/2013      Component Value Date/Time   CALCIUM 10.2 10/07/2018 1237   CALCIUM 10.1 06/01/2017 0756   ALKPHOS 75 10/07/2018 1237   ALKPHOS 52 06/01/2017 0756   AST 49 (H) 10/07/2018 1237   AST 33 05/04/2018 0922   AST 35 (H) 06/01/2017 0756   ALT 60 (H) 10/07/2018 1237   ALT 30 05/04/2018 0922   ALT 28 06/01/2017 0756   BILITOT 0.6 10/07/2018 1237   BILITOT 0.5 05/04/2018 0922   BILITOT 0.58 06/01/2017 0756      All questions were answered. The patient knows to call the clinic with any problems, questions or concerns. No barriers to learning was detected.  I spent 15 minutes counseling the patient face to face. The total time spent in the appointment was 20 minutes and more than 50% was on counseling and review of test results  Heath Lark, MD 10/08/2018 1:31 PM

## 2018-10-08 NOTE — Assessment & Plan Note (Signed)
She complained of significant chronic sinus congestion and recurrent sinus infection With IVIG treatment, her severe infection is less For now, she will continue conservative management and antibiotics treatment as needed

## 2018-10-08 NOTE — Assessment & Plan Note (Signed)
She has no signs or symptoms of CLL recurrence We will continue close monitoring

## 2018-10-08 NOTE — Assessment & Plan Note (Signed)
The patient tolerated intravenous immunoglobulin treatment well at home. However, due to changes in insurance policy, at present time, she does not qualify for home infusion Repeat immunoglobulin level come back within normal range but could be related to recent infusion We will recheck her blood count again in 2 weeks If her serum IgG level is low, we will obtain new insurance prior authorization to approve her treatment.  She agreed with the plan of care

## 2018-10-19 ENCOUNTER — Encounter: Payer: Self-pay | Admitting: Hematology and Oncology

## 2018-10-19 ENCOUNTER — Telehealth: Payer: Self-pay

## 2018-10-19 NOTE — Telephone Encounter (Signed)
-----   Message from Heath Lark, MD sent at 10/19/2018  2:51 PM EST ----- Regarding: IVIG peer to peer Can you call her and ask  1) Were all her treatment last year approved? 2) Which treatment was not approved 3) Who did she call and number and information for follow-up?  Thanks

## 2018-10-19 NOTE — Telephone Encounter (Signed)
Called with below message.  1. She thinks all of her treatments were approved last year in 2019. She changed insurance at the first of this year. 2. Her treatment with [facility] on 1/9 for IVIG was not approved.  3. Her insurance suggested a peer to peer with the medical director and Dr. Alvy Bimler. She does not have a phone number. They will be contacted the office regarding peer to peer and giving a phone #.

## 2018-10-20 ENCOUNTER — Inpatient Hospital Stay: Payer: Managed Care, Other (non HMO)

## 2018-10-20 ENCOUNTER — Other Ambulatory Visit: Payer: Managed Care, Other (non HMO)

## 2018-10-20 DIAGNOSIS — D801 Nonfamilial hypogammaglobulinemia: Secondary | ICD-10-CM

## 2018-10-20 DIAGNOSIS — C9111 Chronic lymphocytic leukemia of B-cell type in remission: Secondary | ICD-10-CM | POA: Diagnosis not present

## 2018-10-20 DIAGNOSIS — J328 Other chronic sinusitis: Secondary | ICD-10-CM

## 2018-10-20 LAB — CBC WITH DIFFERENTIAL/PLATELET
Abs Immature Granulocytes: 0.01 10*3/uL (ref 0.00–0.07)
BASOS PCT: 0 %
Basophils Absolute: 0 10*3/uL (ref 0.0–0.1)
EOS ABS: 0.1 10*3/uL (ref 0.0–0.5)
Eosinophils Relative: 1 %
HCT: 43.6 % (ref 36.0–46.0)
Hemoglobin: 14 g/dL (ref 12.0–15.0)
Immature Granulocytes: 0 %
Lymphocytes Relative: 35 %
Lymphs Abs: 1.7 10*3/uL (ref 0.7–4.0)
MCH: 28.5 pg (ref 26.0–34.0)
MCHC: 32.1 g/dL (ref 30.0–36.0)
MCV: 88.8 fL (ref 80.0–100.0)
Monocytes Absolute: 0.5 10*3/uL (ref 0.1–1.0)
Monocytes Relative: 10 %
Neutro Abs: 2.6 10*3/uL (ref 1.7–7.7)
Neutrophils Relative %: 54 %
Platelets: 152 10*3/uL (ref 150–400)
RBC: 4.91 MIL/uL (ref 3.87–5.11)
RDW: 14 % (ref 11.5–15.5)
WBC: 4.9 10*3/uL (ref 4.0–10.5)
nRBC: 0 % (ref 0.0–0.2)

## 2018-10-20 NOTE — Telephone Encounter (Signed)
Called and left a message for Carolynn Sayers with [facility]. Ask her to call the office back.

## 2018-10-20 NOTE — Telephone Encounter (Signed)
Pls contact Carolynn Sayers to discuss this. It is my understanding that [facility] is supposed to get all the treatment at home approved

## 2018-10-21 ENCOUNTER — Telehealth: Payer: Self-pay

## 2018-10-21 LAB — IGG, IGA, IGM
IgG (Immunoglobin G), Serum: 979 mg/dL (ref 700–1600)
IgM (Immunoglobulin M), Srm: <5 mg/dL — ABNORMAL LOW (ref 26–217)

## 2018-10-21 NOTE — Telephone Encounter (Signed)
-----   Message from Heath Lark, MD sent at 10/21/2018  7:55 AM EST ----- Regarding: Immunoglobulin level Pls let her know result is still normal I suggest repeat in 2-3 weeks Please send lab appt request ----- Message ----- From: Interface, Lab In [place] Sent: 10/20/2018   7:59 AM EST To: Heath Lark, MD

## 2018-10-21 NOTE — Telephone Encounter (Signed)
Called with below message. She verbalized understanding. Scheduling message sent for lab appt 3/11 at [facility].

## 2018-10-26 ENCOUNTER — Encounter: Payer: Self-pay | Admitting: Hematology and Oncology

## 2018-10-27 ENCOUNTER — Telehealth: Payer: Self-pay

## 2018-10-27 NOTE — Telephone Encounter (Signed)
Called and instructed per Dr. Alvy Bimler to call [phone] at [facility] to fill appeal for January home IVIG. She verbalized understanding.  Faxed Requested paper work to [phone] at [facility] to the attention of Dr. Aubery Lapping.

## 2018-10-28 ENCOUNTER — Telehealth: Payer: Self-pay

## 2018-10-28 NOTE — Telephone Encounter (Signed)
She called and left a message to call her.  Called back. She called her insurance company today and got upset/ frustrated trying to get approval letter to get IVIG at home. The only thing they could see was that Dr. Alvy Bimler was going to speak with someone at 3 pm on 3/3, they had no documentation. I told her everything was faxed from the office yesterday to a Dr. Aubery Lapping that was requested. Ask her to wait for approval letter from insurance company to get home IVIG restarted.  She just wanted Dr. Alvy Bimler to know what was going on.  FYI

## 2018-11-01 ENCOUNTER — Encounter: Payer: Self-pay | Admitting: Hematology and Oncology

## 2018-11-01 ENCOUNTER — Telehealth: Payer: Self-pay

## 2018-11-01 NOTE — Telephone Encounter (Signed)
Spoke with pt by phone to let her know that Dr Alvy Bimler does want her to have blood work done on 3/11 prior to her IVIG infusion.  Per Dr Alvy Bimler she does not need to wait for the results before getting the infusion.

## 2018-11-02 ENCOUNTER — Other Ambulatory Visit: Payer: 59

## 2018-11-02 ENCOUNTER — Ambulatory Visit: Payer: 59 | Admitting: Hematology and Oncology

## 2018-11-03 ENCOUNTER — Inpatient Hospital Stay: Payer: Managed Care, Other (non HMO) | Attending: Hematology and Oncology

## 2018-11-03 ENCOUNTER — Other Ambulatory Visit: Payer: Self-pay

## 2018-11-03 DIAGNOSIS — D801 Nonfamilial hypogammaglobulinemia: Secondary | ICD-10-CM | POA: Diagnosis not present

## 2018-11-03 DIAGNOSIS — C9111 Chronic lymphocytic leukemia of B-cell type in remission: Secondary | ICD-10-CM | POA: Diagnosis not present

## 2018-11-03 DIAGNOSIS — J328 Other chronic sinusitis: Secondary | ICD-10-CM

## 2018-11-03 LAB — CBC WITH DIFFERENTIAL/PLATELET
ABS IMMATURE GRANULOCYTES: 0.01 10*3/uL (ref 0.00–0.07)
Basophils Absolute: 0 10*3/uL (ref 0.0–0.1)
Basophils Relative: 0 %
EOS ABS: 0.1 10*3/uL (ref 0.0–0.5)
Eosinophils Relative: 1 %
HCT: 45.3 % (ref 36.0–46.0)
Hemoglobin: 14.7 g/dL (ref 12.0–15.0)
IMMATURE GRANULOCYTES: 0 %
Lymphocytes Relative: 40 %
Lymphs Abs: 1.9 10*3/uL (ref 0.7–4.0)
MCH: 28.8 pg (ref 26.0–34.0)
MCHC: 32.5 g/dL (ref 30.0–36.0)
MCV: 88.8 fL (ref 80.0–100.0)
Monocytes Absolute: 0.5 10*3/uL (ref 0.1–1.0)
Monocytes Relative: 10 %
Neutro Abs: 2.4 10*3/uL (ref 1.7–7.7)
Neutrophils Relative %: 49 %
PLATELETS: 172 10*3/uL (ref 150–400)
RBC: 5.1 MIL/uL (ref 3.87–5.11)
RDW: 14.1 % (ref 11.5–15.5)
WBC: 4.9 10*3/uL (ref 4.0–10.5)
nRBC: 0 % (ref 0.0–0.2)

## 2018-11-04 LAB — IGG, IGA, IGM
IgA: <5 mg/dL — ABNORMAL LOW (ref 64–422)
IgG (Immunoglobin G), Serum: 834 mg/dL (ref 700–1600)

## 2018-11-05 ENCOUNTER — Telehealth: Payer: Self-pay | Admitting: Hematology and Oncology

## 2018-11-05 NOTE — Telephone Encounter (Signed)
Scheduled appt per 3/12 sch message - pt aware of appt date and time   

## 2019-01-24 ENCOUNTER — Encounter: Payer: Self-pay | Admitting: Hematology and Oncology

## 2019-03-22 ENCOUNTER — Other Ambulatory Visit: Payer: Self-pay | Admitting: Hematology and Oncology

## 2019-04-11 ENCOUNTER — Encounter: Payer: Self-pay | Admitting: Hematology and Oncology

## 2019-04-22 ENCOUNTER — Encounter: Payer: Self-pay | Admitting: Hematology and Oncology

## 2019-04-25 ENCOUNTER — Telehealth: Payer: Self-pay

## 2019-04-25 ENCOUNTER — Encounter: Payer: Self-pay | Admitting: Hematology and Oncology

## 2019-04-25 NOTE — Telephone Encounter (Signed)
LVM with Dr Loren Racer office regarding blood work and requested a return call.

## 2019-04-25 NOTE — Telephone Encounter (Signed)
When is she going to get her labs done? We can certainly cancel her labs here More than likely she will need IVIG.  I need to put in orders for IVIG so I would need to see her to get the orders in; I cannot do virtual visit for that

## 2019-04-25 NOTE — Telephone Encounter (Signed)
Spoke with Bianca Parker by phone to let her know we would cancel her lab appt here and that Dr Loren Racer office will draw her labs. Bianca Parker wants to know if she can change her appt with Dr Alvy Bimler to a virtual visit or delay for a few weeks - she is trying to avoid exposure to covid. Please advise.

## 2019-04-25 NOTE — Telephone Encounter (Signed)
-----   Message from Heath Lark, MD sent at 04/25/2019  8:25 AM EDT ----- Regarding: mychart message Please call her and advise mychart messages sometimes do not reach our desk and I did not see her messages until today I am not sure how to get her primary doctor to order these tests Please call her PCP first and see if he is willing to order them and if not she will have to come back here 1) CBC with diff 2) Immunoglobulin IgG

## 2019-04-25 NOTE — Telephone Encounter (Signed)
Pt says she gets IVIG at home q 4 wks.  I cancelled her lab appt here.  She will get labs at Dr Osborne Casco prior to her appt with you.  She agrees to come in for a face to face visit.

## 2019-05-04 ENCOUNTER — Encounter: Payer: Self-pay | Admitting: Hematology and Oncology

## 2019-05-06 ENCOUNTER — Encounter: Payer: Self-pay | Admitting: Hematology and Oncology

## 2019-05-09 ENCOUNTER — Other Ambulatory Visit: Payer: Self-pay

## 2019-05-09 ENCOUNTER — Encounter: Payer: Self-pay | Admitting: Hematology and Oncology

## 2019-05-09 ENCOUNTER — Other Ambulatory Visit: Payer: Managed Care, Other (non HMO)

## 2019-05-09 ENCOUNTER — Inpatient Hospital Stay: Payer: Managed Care, Other (non HMO) | Attending: Hematology and Oncology | Admitting: Hematology and Oncology

## 2019-05-09 DIAGNOSIS — C9111 Chronic lymphocytic leukemia of B-cell type in remission: Secondary | ICD-10-CM | POA: Insufficient documentation

## 2019-05-09 DIAGNOSIS — R635 Abnormal weight gain: Secondary | ICD-10-CM

## 2019-05-09 DIAGNOSIS — Z299 Encounter for prophylactic measures, unspecified: Secondary | ICD-10-CM | POA: Diagnosis not present

## 2019-05-09 DIAGNOSIS — D801 Nonfamilial hypogammaglobulinemia: Secondary | ICD-10-CM | POA: Diagnosis not present

## 2019-05-09 DIAGNOSIS — Z79899 Other long term (current) drug therapy: Secondary | ICD-10-CM | POA: Insufficient documentation

## 2019-05-09 DIAGNOSIS — D649 Anemia, unspecified: Secondary | ICD-10-CM | POA: Insufficient documentation

## 2019-05-09 NOTE — Assessment & Plan Note (Signed)
We discussed the importance of influenza vaccination We have extensive discussion about social distancing and practice of hand hygiene and precaution

## 2019-05-09 NOTE — Assessment & Plan Note (Signed)
She has no signs or symptoms of CLL recurrence We will continue close monitoring

## 2019-05-09 NOTE — Progress Notes (Signed)
[facility] OFFICE PROGRESS NOTE  Patient Care Team: Tisovec, Fransico Him, MD as PCP - General (Internal Medicine) Deneise Lever, MD as Referring Physician (Pulmonary Disease) Rozetta Nunnery, MD as Referring Physician (Otolaryngology) Milus Banister, MD (Gastroenterology) Arvella Nigh, MD (Obstetrics and Gynecology) Heath Lark, MD as Consulting Physician (Hematology and Oncology) Jari Pigg, MD as Consulting Physician (Dermatology) Marygrace Drought, MD as Consulting Physician (Ophthalmology)  ASSESSMENT & PLAN:  Chronic lymphoid leukemia in remission She has no signs or symptoms of CLL recurrence We will continue close monitoring  Hypogammaglobulinemia, acquired The patient tolerated intravenous immunoglobulin treatment well at home. Repeat immunoglobulin level come back within normal range but could be related to recent infusion She will continue IVIG treatment once a month  Excessive weight gain She is noted to have excessive weight gain I recommend dietary modification and lifestyle changes  Preventive measure We discussed the importance of influenza vaccination We have extensive discussion about social distancing and practice of hand hygiene and precaution   No orders of the defined types were placed in this encounter.   INTERVAL HISTORY: Please see below for problem oriented charting. She returns for further follow-up She tolerated IVIG at home well She had recent tooth infection that was taken care of by her dentist No recent hospitalization She had numerous questions about social distancing She denies IVIG infusion reaction No recent lymphadenopathy  SUMMARY OF ONCOLOGIC HISTORY:  She was in one of the first cohorts of patients treated with FCR on protocol at the [facility] at time of her initial diagnosis in July 2002. Technically she had RAI  stage III disease due to anemia but she had no organomegaly or  lymphadenopathy on exam. FCR was started in 12/2001, and continued through 06/2002. She achieved a complete hematologic response and,which has been durable now for over 11 years!  She did develop hypogammaglobulinemia. As a result, she gets recurrent bronchitis and sinusitis. In April 2013 she was started her on monthly intravenous immunoglobulin 400 mg per kilogram and this has significantly impacted on the frequency and severity of her infections. This was continued through April of 2014. She has had no new infections since stopping the IVIG but as expected, immunoglobulin levels have fallen to pretreatment levels.   She has a history of her recurrent squamous cell carcinomas of her skin and has had 2 lesions excised from her left tibial area and another 1 excised from the vulvar area. Recently, she had a basal cell carcinoma removed. She has a history of both herpes zoster and herpes simplex infections. Recently, she had abnormal Pap smear and currently on close observation. Recent colposcopy was negative.  She received IVIG treatment for acquired hypogammaglobulinemia but stopped in April 2014 due to sick serum syndrome In July 2016, IVIG is resumed with premedication In August 2016, she developed cellulitis from venipuncture, resolved with antibiotics From 2016 to 2019, she continues to have recurrent infection requiring antibiotic therapy.  She is also receiving IVIG treatment that helps reduce the risk of severe infection.   REVIEW OF SYSTEMS:   Constitutional: Denies fevers, chills or abnormal weight loss Eyes: Denies blurriness of vision Ears, nose, mouth, throat, and face: Denies mucositis or sore throat Respiratory: Denies cough, dyspnea or wheezes Cardiovascular: Denies palpitation, chest discomfort or lower extremity swelling Gastrointestinal:  Denies nausea, heartburn or change in bowel habits Skin: Denies abnormal skin rashes Lymphatics: Denies new lymphadenopathy or easy  bruising Neurological:Denies numbness, tingling or new weaknesses Behavioral/Psych:  Mood is stable, no new changes  All other systems were reviewed with the patient and are negative.  I have reviewed the past medical history, past surgical history, social history and family history with the patient and they are unchanged from previous note.  ALLERGIES:  is allergic to penicillins; pentamidine; and sulfonamide derivatives.  MEDICATIONS:  Current Outpatient Medications  Medication Sig Dispense Refill  . acyclovir (ZOVIRAX) 400 MG tablet TAKE 1 TABLET BY MOUTH TWO TIMES DAILY 180 tablet 3  . atorvastatin (LIPITOR) 10 MG tablet Take 10 mg by mouth daily.    . Calcium Carbonate-Vitamin D (CALTRATE 600+D) 600-400 MG-UNIT per tablet Take 1 tablet by mouth daily.    . fexofenadine (ALLEGRA) 180 MG tablet Take 180 mg by mouth daily.      Marland Kitchen levothyroxine (SYNTHROID, LEVOTHROID) 50 MCG tablet Take 50 mcg by mouth daily before breakfast.     . Multiple Vitamin (MULTIVITAMIN) tablet Take 1 tablet by mouth daily.    . mupirocin ointment (BACTROBAN) 2 % Apply 1 application topically as needed.     Marland Kitchen omeprazole (PRILOSEC) 20 MG capsule Take 20 mg by mouth daily.    . psyllium (METAMUCIL) 58.6 % powder Take 1 packet by mouth daily.     Current Facility-Administered Medications  Medication Dose Route Frequency Provider Last Rate Last Dose  . 0.9 %  sodium chloride infusion  500 mL Intravenous Once Milus Banister, MD       Facility-Administered Medications Ordered in Other Visits  Medication Dose Route Frequency Provider Last Rate Last Dose  . methylPREDNISolone sodium succinate (SOLU-MEDROL) 40 mg/mL injection 20 mg  20 mg Intravenous Q30 days Heath Lark, MD   20 mg at 02/09/17 0920    PHYSICAL EXAMINATION: ECOG PERFORMANCE STATUS: 0 - Asymptomatic  Vitals:   05/09/19 0851  BP: 135/73  Pulse: 100  Resp: 18  Temp: 98.9 F (37.2 C)  SpO2: 100%   Filed Weights   05/09/19 0851  Weight: 209  lb 12.8 oz (95.2 kg)    GENERAL:alert, no distress and comfortable SKIN: skin color, texture, turgor are normal, no rashes or significant lesions EYES: normal, Conjunctiva are pink and non-injected, sclera clear OROPHARYNX:no exudate, no erythema and lips, buccal mucosa, and tongue normal  NECK: supple, thyroid normal size, non-tender, without nodularity LYMPH:  no palpable lymphadenopathy in the cervical, axillary or inguinal LUNGS: clear to auscultation and percussion with normal breathing effort HEART: regular rate & rhythm and no murmurs and no lower extremity edema ABDOMEN:abdomen soft, non-tender and normal bowel sounds Musculoskeletal:no cyanosis of digits and no clubbing  NEURO: alert & oriented x 3 with fluent speech, no focal motor/sensory deficits  LABORATORY DATA:  I have reviewed the data as listed    Component Value Date/Time   NA 144 10/07/2018 1237   NA 142 06/01/2017 0756   K 4.3 10/07/2018 1237   K 4.2 06/01/2017 0756   CL 107 10/07/2018 1237   CL 109 (H) 01/20/2013 0801   CO2 27 10/07/2018 1237   CO2 21 (L) 06/01/2017 0756   GLUCOSE 101 (H) 10/07/2018 1237   GLUCOSE 88 06/01/2017 0756   GLUCOSE 89 01/20/2013 0801   BUN 15 10/07/2018 1237   BUN 14.5 06/01/2017 0756   CREATININE 1.10 (H) 10/07/2018 1237   CREATININE 1.06 (H) 05/04/2018 0922   CREATININE 1.0 06/01/2017 0756   CALCIUM 10.2 10/07/2018 1237   CALCIUM 10.1 06/01/2017 0756   PROT 7.8 10/07/2018 1237   PROT 7.7 06/01/2017 0756  ALBUMIN 4.3 10/07/2018 1237   ALBUMIN 3.9 06/01/2017 0756   AST 49 (H) 10/07/2018 1237   AST 33 05/04/2018 0922   AST 35 (H) 06/01/2017 0756   ALT 60 (H) 10/07/2018 1237   ALT 30 05/04/2018 0922   ALT 28 06/01/2017 0756   ALKPHOS 75 10/07/2018 1237   ALKPHOS 52 06/01/2017 0756   BILITOT 0.6 10/07/2018 1237   BILITOT 0.5 05/04/2018 0922   BILITOT 0.58 06/01/2017 0756   GFRNONAA 50 (L) 10/07/2018 1237   GFRNONAA 52 (L) 05/04/2018 0922   GFRAA 58 (L) 10/07/2018  1237   GFRAA 60 (L) 05/04/2018 0922    No results found for: SPEP, UPEP  Lab Results  Component Value Date   WBC 4.9 11/03/2018   NEUTROABS 2.4 11/03/2018   HGB 14.7 11/03/2018   HCT 45.3 11/03/2018   MCV 88.8 11/03/2018   PLT 172 11/03/2018      Chemistry      Component Value Date/Time   NA 144 10/07/2018 1237   NA 142 06/01/2017 0756   K 4.3 10/07/2018 1237   K 4.2 06/01/2017 0756   CL 107 10/07/2018 1237   CL 109 (H) 01/20/2013 0801   CO2 27 10/07/2018 1237   CO2 21 (L) 06/01/2017 0756   BUN 15 10/07/2018 1237   BUN 14.5 06/01/2017 0756   CREATININE 1.10 (H) 10/07/2018 1237   CREATININE 1.06 (H) 05/04/2018 0922   CREATININE 1.0 06/01/2017 0756   GLU 92 06/17/2013      Component Value Date/Time   CALCIUM 10.2 10/07/2018 1237   CALCIUM 10.1 06/01/2017 0756   ALKPHOS 75 10/07/2018 1237   ALKPHOS 52 06/01/2017 0756   AST 49 (H) 10/07/2018 1237   AST 33 05/04/2018 0922   AST 35 (H) 06/01/2017 0756   ALT 60 (H) 10/07/2018 1237   ALT 30 05/04/2018 0922   ALT 28 06/01/2017 0756   BILITOT 0.6 10/07/2018 1237   BILITOT 0.5 05/04/2018 0922   BILITOT 0.58 06/01/2017 0756       All questions were answered. The patient knows to call the clinic with any problems, questions or concerns. No barriers to learning was detected.  I spent 15 minutes counseling the patient face to face. The total time spent in the appointment was 20 minutes and more than 50% was on counseling and review of test results  Heath Lark, MD 05/09/2019 2:23 PM

## 2019-05-09 NOTE — Assessment & Plan Note (Signed)
The patient tolerated intravenous immunoglobulin treatment well at home. Repeat immunoglobulin level come back within normal range but could be related to recent infusion She will continue IVIG treatment once a month

## 2019-05-09 NOTE — Assessment & Plan Note (Signed)
She is noted to have excessive weight gain I recommend dietary modification and lifestyle changes

## 2019-05-10 ENCOUNTER — Telehealth: Payer: Self-pay | Admitting: Hematology and Oncology

## 2019-05-10 NOTE — Telephone Encounter (Signed)
I talk with patient regarding schedule  

## 2019-07-08 ENCOUNTER — Encounter: Payer: Self-pay | Admitting: Hematology and Oncology

## 2019-07-11 ENCOUNTER — Telehealth: Payer: Self-pay

## 2019-07-11 ENCOUNTER — Encounter: Payer: Self-pay | Admitting: Hematology and Oncology

## 2019-07-11 NOTE — Telephone Encounter (Signed)
Called regarding mychart message. Called Pam at [facility] regarding mychart message to check on insurance for home IVIG. She will call Mrs. [place] regarding insurance. She verbalized understanding.

## 2019-07-14 ENCOUNTER — Encounter: Payer: Self-pay | Admitting: Hematology and Oncology

## 2019-07-15 ENCOUNTER — Telehealth: Payer: Self-pay

## 2019-07-15 NOTE — Telephone Encounter (Signed)
Called her regarding home IVIG case # [id number] has been approved thru her insurance. She will contact [facility] Monday regarding getting her treatment.

## 2019-09-04 ENCOUNTER — Encounter: Payer: Self-pay | Admitting: Hematology and Oncology

## 2019-09-19 ENCOUNTER — Encounter: Payer: Self-pay | Admitting: Hematology and Oncology

## 2019-10-10 ENCOUNTER — Encounter: Payer: Self-pay | Admitting: Hematology and Oncology

## 2019-10-24 ENCOUNTER — Encounter: Payer: Self-pay | Admitting: Hematology and Oncology

## 2019-10-25 ENCOUNTER — Other Ambulatory Visit: Payer: Self-pay | Admitting: Hematology and Oncology

## 2019-10-25 DIAGNOSIS — D801 Nonfamilial hypogammaglobulinemia: Secondary | ICD-10-CM

## 2019-10-25 DIAGNOSIS — C9111 Chronic lymphocytic leukemia of B-cell type in remission: Secondary | ICD-10-CM

## 2019-10-26 ENCOUNTER — Encounter: Payer: Self-pay | Admitting: Hematology and Oncology

## 2019-10-26 ENCOUNTER — Telehealth: Payer: Self-pay

## 2019-10-26 ENCOUNTER — Other Ambulatory Visit: Payer: Self-pay | Admitting: Hematology and Oncology

## 2019-10-26 NOTE — Telephone Encounter (Signed)
Spoke with pt by phone to inform her we have faxed a letter to Dr Osborne Casco requesting blood work at his office.   A copy of the letter will be mailed to the pt.

## 2019-10-26 NOTE — Telephone Encounter (Signed)
Order for blood work has been faxed to Dr Loren Racer office at [phone] and [phone]

## 2019-10-28 ENCOUNTER — Encounter: Payer: Self-pay | Admitting: Hematology and Oncology

## 2019-11-04 ENCOUNTER — Telehealth: Payer: Self-pay | Admitting: Hematology and Oncology

## 2019-11-04 NOTE — Telephone Encounter (Signed)
Contacted patient to verify mychart video visit for pre reg 

## 2019-11-04 NOTE — Telephone Encounter (Signed)
Contacted to verify mychart video visit for pre reg

## 2019-11-07 ENCOUNTER — Inpatient Hospital Stay: Payer: Medicare Other | Attending: Hematology and Oncology | Admitting: Hematology and Oncology

## 2019-11-07 DIAGNOSIS — Z7189 Other specified counseling: Secondary | ICD-10-CM | POA: Diagnosis not present

## 2019-11-07 DIAGNOSIS — D801 Nonfamilial hypogammaglobulinemia: Secondary | ICD-10-CM

## 2019-11-07 DIAGNOSIS — C9111 Chronic lymphocytic leukemia of B-cell type in remission: Secondary | ICD-10-CM | POA: Diagnosis not present

## 2019-11-08 ENCOUNTER — Encounter: Payer: Self-pay | Admitting: Hematology and Oncology

## 2019-11-08 DIAGNOSIS — Z7189 Other specified counseling: Secondary | ICD-10-CM | POA: Insufficient documentation

## 2019-11-08 NOTE — Assessment & Plan Note (Signed)
The patient tolerated intravenous immunoglobulin treatment well at home. The patient has been paying for her treatment out-of-pocket I told the patient it is unlikely for her to be helpful to continue IVIG infusion throughout the summer as most people do better in the summertime Recommend she take 6 months break from treatment I plan to see her again in 6 months for further follow-up She will think about the option of taking a break from treatment

## 2019-11-08 NOTE — Progress Notes (Signed)
HEMATOLOGY-ONCOLOGY ELECTRONIC VISIT PROGRESS NOTE  Patient Care Team: Tisovec, Fransico Him, MD as PCP - General (Internal Medicine) Deneise Lever, MD as Referring Physician (Pulmonary Disease) Rozetta Nunnery, MD as Referring Physician (Otolaryngology) Milus Banister, MD (Gastroenterology) Arvella Nigh, MD (Obstetrics and Gynecology) Heath Lark, MD as Consulting Physician (Hematology and Oncology) Jari Pigg, MD as Consulting Physician (Dermatology) Marygrace Drought, MD as Consulting Physician (Ophthalmology)  I connected with by [facility] video conference   ASSESSMENT & PLAN:  Chronic lymphoid leukemia in remission She has no signs or symptoms of CLL recurrence We will continue close monitoring There is no contraindication for her to proceed with Covid vaccination  Hypogammaglobulinemia, acquired The patient tolerated intravenous immunoglobulin treatment well at home. The patient has been paying for her treatment out-of-pocket I told the patient it is unlikely for her to be helpful to continue IVIG infusion throughout the summer as most people do better in the summertime Recommend she take 6 months break from treatment I plan to see her again in 6 months for further follow-up She will think about the option of taking a break from treatment  Goals of care, counseling/discussion We discussed the risk and benefits of continuing IVIG treatment versus taking a break The patient is fearful of stopping I told her IVIG while helpful, is not a life-saving medication It is purely of preventive measure I recommend her to take a break from treatment There is no contraindication for her to proceed with Covid vaccination    Orders Placed This Encounter  Procedures  . CBC with Differential/Platelet    Standing Status:   Future    Standing Expiration Date:   12/12/2020  . IgG, IgA, IgM    Standing Status:   Future    Standing Expiration Date:   12/12/2020    INTERVAL HISTORY:  Please see below for problem oriented charting. She is seen as Follow-up for CLL and chronic IVIG treatment for hypogammaglobulinemia She chose for E-visit for fear of coming in in close contact in case she gets infection Her husband recently lost his job and she was not able to get her IVIG covered The patient has been paying out of pocket to get home IVIG infusion She denies recent infection, fever or chills No infusion reactions  SUMMARY OF ONCOLOGIC HISTORY:  She was in one of the first cohorts of patients treated with FCR on protocol at the [facility] at time of her initial diagnosis in July 2002. Technically she had RAI  stage III disease due to anemia but she had no organomegaly or lymphadenopathy on exam. FCR was started in 12/2001, and continued through 06/2002. She achieved a complete hematologic response and,which has been durable now for over 11 years!  She did develop hypogammaglobulinemia. As a result, she gets recurrent bronchitis and sinusitis. In April 2013 she was started her on monthly intravenous immunoglobulin 400 mg per kilogram and this has significantly impacted on the frequency and severity of her infections. This was continued through April of 2014. She has had no new infections since stopping the IVIG but as expected, immunoglobulin levels have fallen to pretreatment levels.   She has a history of her recurrent squamous cell carcinomas of her skin and has had 2 lesions excised from her left tibial area and another 1 excised from the vulvar area. Recently, she had a basal cell carcinoma removed. She has a history of both herpes zoster and herpes simplex infections. Recently, she had abnormal Pap smear  and currently on close observation. Recent colposcopy was negative.  She received IVIG treatment for acquired hypogammaglobulinemia but stopped in April 2014 due to sick serum syndrome In July 2016, IVIG is resumed with premedication In August 2016, she  developed cellulitis from venipuncture, resolved with antibiotics From 2016 to 2019, she continues to have recurrent infection requiring antibiotic therapy.  She is also receiving IVIG treatment that helps reduce the risk of severe infection.  REVIEW OF SYSTEMS:   Constitutional: Denies fevers, chills or abnormal weight loss Eyes: Denies blurriness of vision Ears, nose, mouth, throat, and face: Denies mucositis or sore throat Respiratory: Denies cough, dyspnea or wheezes Cardiovascular: Denies palpitation, chest discomfort Gastrointestinal:  Denies nausea, heartburn or change in bowel habits Skin: Denies abnormal skin rashes Lymphatics: Denies new lymphadenopathy or easy bruising Neurological:Denies numbness, tingling or new weaknesses Behavioral/Psych: Mood is stable, no new changes  Extremities: No lower extremity edema All other systems were reviewed with the patient and are negative.  I have reviewed the past medical history, past surgical history, social history and family history with the patient and they are unchanged from previous note.  ALLERGIES:  is allergic to penicillins; pentamidine; and sulfonamide derivatives.  MEDICATIONS:  Current Outpatient Medications  Medication Sig Dispense Refill  . famotidine (PEPCID) 10 MG tablet Take 10 mg by mouth daily.    Marland Kitchen acyclovir (ZOVIRAX) 400 MG tablet TAKE 1 TABLET BY MOUTH TWO TIMES DAILY 180 tablet 3  . atorvastatin (LIPITOR) 10 MG tablet Take 10 mg by mouth daily.    . Calcium Carbonate-Vitamin D (CALTRATE 600+D) 600-400 MG-UNIT per tablet Take 1 tablet by mouth daily.    . fexofenadine (ALLEGRA) 180 MG tablet Take 180 mg by mouth daily.      Marland Kitchen levothyroxine (SYNTHROID, LEVOTHROID) 50 MCG tablet Take 50 mcg by mouth daily before breakfast.     . Multiple Vitamin (MULTIVITAMIN) tablet Take 1 tablet by mouth daily.    . mupirocin ointment (BACTROBAN) 2 % Apply 1 application topically as needed.     Marland Kitchen omeprazole (PRILOSEC) 20 MG  capsule Take 20 mg by mouth daily.    . psyllium (METAMUCIL) 58.6 % powder Take 1 packet by mouth daily.     Current Facility-Administered Medications  Medication Dose Route Frequency Provider Last Rate Last Admin  . 0.9 %  sodium chloride infusion  500 mL Intravenous Once Milus Banister, MD       Facility-Administered Medications Ordered in Other Visits  Medication Dose Route Frequency Provider Last Rate Last Admin  . methylPREDNISolone sodium succinate (SOLU-MEDROL) 40 mg/mL injection 20 mg  20 mg Intravenous Q30 days Heath Lark, MD   20 mg at 02/09/17 0920    PHYSICAL EXAMINATION: ECOG PERFORMANCE STATUS: 0 - Asymptomatic  LABORATORY DATA:  I have reviewed the data as listed CMP Latest Ref Rng & Units 10/07/2018 05/04/2018 11/16/2017  Glucose 70 - 99 mg/dL 101(H) 102(H) 94  BUN 8 - 23 mg/dL 15 12 13   Creatinine 0.44 - 1.00 mg/dL 1.10(H) 1.06(H) 0.95  Sodium 135 - 145 mmol/L 144 144 141  Potassium 3.5 - 5.1 mmol/L 4.3 4.3 4.1  Chloride 98 - 111 mmol/L 107 108 108  CO2 22 - 32 mmol/L 27 28 23   Calcium 8.9 - 10.3 mg/dL 10.2 9.8 10.0  Total Protein 6.5 - 8.1 g/dL 7.8 7.8 7.5  Total Bilirubin 0.3 - 1.2 mg/dL 0.6 0.5 0.6  Alkaline Phos 38 - 126 U/L 75 72 53  AST 15 - 41 U/L  49(H) 33 36  ALT 0 - 44 U/L 60(H) 30 33    Lab Results  Component Value Date   WBC 4.9 11/03/2018   HGB 14.7 11/03/2018   HCT 45.3 11/03/2018   MCV 88.8 11/03/2018   PLT 172 11/03/2018   NEUTROABS 2.4 11/03/2018    I discussed the assessment and treatment plan with the patient. The patient was provided an opportunity to ask questions and all were answered. The patient agreed with the plan and demonstrated an understanding of the instructions. The patient was advised to call back or seek an in-person evaluation if the symptoms worsen or if the condition fails to improve as anticipated.    I spent 20 minutes for the appointment reviewing test results, discuss management and coordination of care.  Heath Lark, MD 11/08/2019 5:10 PM

## 2019-11-08 NOTE — Assessment & Plan Note (Signed)
She has no signs or symptoms of CLL recurrence We will continue close monitoring There is no contraindication for her to proceed with Covid vaccination

## 2019-11-08 NOTE — Assessment & Plan Note (Signed)
We discussed the risk and benefits of continuing IVIG treatment versus taking a break The patient is fearful of stopping I told her IVIG while helpful, is not a life-saving medication It is purely of preventive measure I recommend her to take a break from treatment There is no contraindication for her to proceed with Covid vaccination

## 2019-11-09 ENCOUNTER — Telehealth: Payer: Self-pay | Admitting: Hematology and Oncology

## 2019-11-09 NOTE — Telephone Encounter (Signed)
Scheduled per 3/16 sch msg. Called and spoke with pt, confirmed 9/14 appt

## 2019-11-17 ENCOUNTER — Other Ambulatory Visit: Payer: Self-pay | Admitting: Obstetrics and Gynecology

## 2019-11-17 DIAGNOSIS — Z1231 Encounter for screening mammogram for malignant neoplasm of breast: Secondary | ICD-10-CM

## 2019-12-02 ENCOUNTER — Other Ambulatory Visit: Payer: Self-pay

## 2019-12-02 ENCOUNTER — Ambulatory Visit
Admission: RE | Admit: 2019-12-02 | Discharge: 2019-12-02 | Disposition: A | Discharge status: Home / Self Care | Payer: Medicare Other | Source: Ambulatory Visit | Attending: Obstetrics and Gynecology | Admitting: Obstetrics and Gynecology

## 2019-12-02 DIAGNOSIS — Z1231 Encounter for screening mammogram for malignant neoplasm of breast: Secondary | ICD-10-CM

## 2020-03-05 ENCOUNTER — Encounter: Payer: Self-pay | Admitting: General Practice

## 2020-04-08 ENCOUNTER — Encounter: Payer: Self-pay | Admitting: Hematology and Oncology

## 2020-05-08 ENCOUNTER — Inpatient Hospital Stay: Payer: Medicare Other | Attending: Hematology and Oncology | Admitting: Hematology and Oncology

## 2020-05-08 ENCOUNTER — Inpatient Hospital Stay: Payer: Medicare Other

## 2020-05-08 ENCOUNTER — Encounter: Payer: Self-pay | Admitting: Hematology and Oncology

## 2020-05-08 ENCOUNTER — Telehealth: Payer: Self-pay | Admitting: Hematology and Oncology

## 2020-05-08 ENCOUNTER — Telehealth: Payer: Self-pay

## 2020-05-08 ENCOUNTER — Other Ambulatory Visit: Payer: Self-pay

## 2020-05-08 DIAGNOSIS — Z85820 Personal history of malignant melanoma of skin: Secondary | ICD-10-CM | POA: Insufficient documentation

## 2020-05-08 DIAGNOSIS — D801 Nonfamilial hypogammaglobulinemia: Secondary | ICD-10-CM | POA: Insufficient documentation

## 2020-05-08 DIAGNOSIS — R635 Abnormal weight gain: Secondary | ICD-10-CM | POA: Diagnosis not present

## 2020-05-08 DIAGNOSIS — C9111 Chronic lymphocytic leukemia of B-cell type in remission: Secondary | ICD-10-CM | POA: Diagnosis not present

## 2020-05-08 DIAGNOSIS — Z85828 Personal history of other malignant neoplasm of skin: Secondary | ICD-10-CM | POA: Diagnosis not present

## 2020-05-08 LAB — CBC WITH DIFFERENTIAL/PLATELET
Abs Immature Granulocytes: 0.03 10*3/uL (ref 0.00–0.07)
Basophils Absolute: 0 10*3/uL (ref 0.0–0.1)
Basophils Relative: 1 %
Eosinophils Absolute: 0.3 10*3/uL (ref 0.0–0.5)
Eosinophils Relative: 4 %
HCT: 45.4 % (ref 36.0–46.0)
Hemoglobin: 14.8 g/dL (ref 12.0–15.0)
Immature Granulocytes: 1 %
Lymphocytes Relative: 34 %
Lymphs Abs: 2.2 10*3/uL (ref 0.7–4.0)
MCH: 29.4 pg (ref 26.0–34.0)
MCHC: 32.6 g/dL (ref 30.0–36.0)
MCV: 90.3 fL (ref 80.0–100.0)
Monocytes Absolute: 0.7 10*3/uL (ref 0.1–1.0)
Monocytes Relative: 11 %
Neutro Abs: 3.2 10*3/uL (ref 1.7–7.7)
Neutrophils Relative %: 49 %
Platelets: 155 10*3/uL (ref 150–400)
RBC: 5.03 MIL/uL (ref 3.87–5.11)
RDW: 13.9 % (ref 11.5–15.5)
WBC: 6.4 10*3/uL (ref 4.0–10.5)
nRBC: 0 % (ref 0.0–0.2)

## 2020-05-08 NOTE — Progress Notes (Signed)
[employer] OFFICE PROGRESS NOTE  Patient Care Team: Tisovec, Fransico Him, MD as PCP - General (Internal Medicine) Deneise Lever, MD as Referring Physician (Pulmonary Disease) Rozetta Nunnery, MD as Referring Physician (Otolaryngology) Milus Banister, MD (Gastroenterology) Arvella Nigh, MD (Obstetrics and Gynecology) Heath Lark, MD as Consulting Physician (Hematology and Oncology) Jari Pigg, MD as Consulting Physician (Dermatology) Marygrace Drought, MD as Consulting Physician (Ophthalmology)  ASSESSMENT & PLAN:  Chronic lymphoid leukemia in remission She has no signs or symptoms of CLL recurrence We will continue close monitoring  Hypogammaglobulinemia, acquired She has acquired hypogammaglobulinemia due to CLL and prior treatment This is a lifelong problem According to the patient, when she receives IVIG, it reduces his risk of sinus infection and severe infection The patient tolerated intravenous immunoglobulin treatment well at home. We discussed the risk and benefits for prophylactic IVIG infusion to prevent recurrent infection during the wintertime and she would like to proceed She will get monthly IVIG infusion, 40 g IV through advanced home care service We will inform them to proceed with infusion for 6 months and then stop She will continue premedication before each IVIG infusion   Excessive weight gain She is noted to have excessive weight gain I recommend dietary modification and lifestyle changes We discussed the role of referral to weight management center if she is interested  History of skin cancer She will continue close monitoring with dermatologist She is at risk of skin cancer due to CLL   No orders of the defined types were placed in this encounter.   All questions were answered. The patient knows to call the clinic with any problems, questions or concerns. The total time spent in the appointment was 20 minutes encounter with  patients including review of chart and various tests results, discussions about plan of care and coordination of care plan   Heath Lark, MD 05/08/2020 9:39 AM  INTERVAL HISTORY: Please see below for problem oriented charting. She returns for further follow-up She denies infusion reactions to IVIG She completed treatment in the spring She has not been ill in the summer She has completed appropriate vaccination and practice social distancing She follows closely with dermatologist No new lymphadenopathy  SUMMARY OF ONCOLOGIC HISTORY:  She was in one of the first cohorts of patients treated with FCR on protocol at the [facility] at time of her initial diagnosis in July 2002. Technically she had RAI  stage III disease due to anemia but she had no organomegaly or lymphadenopathy on exam. FCR was started in 12/2001, and continued through 06/2002. She achieved a complete hematologic response and,which has been durable now for over 11 years!  She did develop hypogammaglobulinemia. As a result, she gets recurrent bronchitis and sinusitis. In April 2013 she was started her on monthly intravenous immunoglobulin 400 mg per kilogram and this has significantly impacted on the frequency and severity of her infections. This was continued through April of 2014. She has had no new infections since stopping the IVIG but as expected, immunoglobulin levels have fallen to pretreatment levels.   She has a history of her recurrent squamous cell carcinomas of her skin and has had 2 lesions excised from her left tibial area and another 1 excised from the vulvar area. Recently, she had a basal cell carcinoma removed. She has a history of both herpes zoster and herpes simplex infections. Recently, she had abnormal Pap smear and currently on close observation. Recent colposcopy was negative.  She  received IVIG treatment for acquired hypogammaglobulinemia but stopped in April 2014 due to sick serum  syndrome In July 2016, IVIG is resumed with premedication In August 2016, she developed cellulitis from venipuncture, resolved with antibiotics From 2016 to 2021, she continues to have recurrent infection requiring antibiotic therapy.  She is also receiving IVIG treatment that helps reduce the risk of severe infection.  REVIEW OF SYSTEMS:   Constitutional: Denies fevers, chills or abnormal weight loss Eyes: Denies blurriness of vision Ears, nose, mouth, throat, and face: Denies mucositis or sore throat Respiratory: Denies cough, dyspnea or wheezes Cardiovascular: Denies palpitation, chest discomfort or lower extremity swelling Gastrointestinal:  Denies nausea, heartburn or change in bowel habits Skin: Denies abnormal skin rashes Lymphatics: Denies new lymphadenopathy or easy bruising Neurological:Denies numbness, tingling or new weaknesses Behavioral/Psych: Mood is stable, no new changes  All other systems were reviewed with the patient and are negative.  I have reviewed the past medical history, past surgical history, social history and family history with the patient and they are unchanged from previous note.  ALLERGIES:  is allergic to penicillins, pentamidine, and sulfonamide derivatives.  MEDICATIONS:  Current Outpatient Medications  Medication Sig Dispense Refill  . acyclovir (ZOVIRAX) 400 MG tablet TAKE 1 TABLET BY MOUTH TWO TIMES DAILY 180 tablet 3  . atorvastatin (LIPITOR) 10 MG tablet Take 10 mg by mouth daily.    . Calcium Carbonate-Vitamin D (CALTRATE 600+D) 600-400 MG-UNIT per tablet Take 1 tablet by mouth daily.    . famotidine (PEPCID) 10 MG tablet Take 10 mg by mouth daily.    . fexofenadine (ALLEGRA) 180 MG tablet Take 180 mg by mouth daily.      Marland Kitchen levothyroxine (SYNTHROID, LEVOTHROID) 50 MCG tablet Take 50 mcg by mouth daily before breakfast.     . Multiple Vitamin (MULTIVITAMIN) tablet Take 1 tablet by mouth daily.    . mupirocin ointment (BACTROBAN) 2 % Apply 1  application topically as needed.     Marland Kitchen omeprazole (PRILOSEC) 20 MG capsule Take 20 mg by mouth daily.     Current Facility-Administered Medications  Medication Dose Route Frequency Provider Last Rate Last Admin  . 0.9 %  sodium chloride infusion  500 mL Intravenous Once Milus Banister, MD       Facility-Administered Medications Ordered in Other Visits  Medication Dose Route Frequency Provider Last Rate Last Admin  . methylPREDNISolone sodium succinate (SOLU-MEDROL) 40 mg/mL injection 20 mg  20 mg Intravenous Q30 days Heath Lark, MD   20 mg at 02/09/17 0920    PHYSICAL EXAMINATION: ECOG PERFORMANCE STATUS: 1 - Symptomatic but completely ambulatory  Vitals:   05/08/20 0826  BP: 132/69  Pulse: 98  Resp: 18  Temp: 98.2 F (36.8 C)  SpO2: 96%   Filed Weights   05/08/20 0826  Weight: 216 lb 9.6 oz (98.2 kg)    GENERAL:alert, no distress and comfortable SKIN: skin color, texture, turgor are normal, no rashes or significant lesions EYES: normal, Conjunctiva are pink and non-injected, sclera clear OROPHARYNX:no exudate, no erythema and lips, buccal mucosa, and tongue normal  NECK: supple, thyroid normal size, non-tender, without nodularity LYMPH:  no palpable lymphadenopathy in the cervical, axillary or inguinal LUNGS: clear to auscultation and percussion with normal breathing effort HEART: regular rate & rhythm and no murmurs and no lower extremity edema ABDOMEN:abdomen soft, non-tender and normal bowel sounds Musculoskeletal:no cyanosis of digits and no clubbing  NEURO: alert & oriented x 3 with fluent speech, no focal motor/sensory deficits  LABORATORY DATA:  I have reviewed the data as listed    Component Value Date/Time   NA 144 10/07/2018 1237   NA 142 06/01/2017 0756   K 4.3 10/07/2018 1237   K 4.2 06/01/2017 0756   CL 107 10/07/2018 1237   CL 109 (H) 01/20/2013 0801   CO2 27 10/07/2018 1237   CO2 21 (L) 06/01/2017 0756   GLUCOSE 101 (H) 10/07/2018 1237    GLUCOSE 88 06/01/2017 0756   GLUCOSE 89 01/20/2013 0801   BUN 15 10/07/2018 1237   BUN 14.5 06/01/2017 0756   CREATININE 1.10 (H) 10/07/2018 1237   CREATININE 1.06 (H) 05/04/2018 0922   CREATININE 1.0 06/01/2017 0756   CALCIUM 10.2 10/07/2018 1237   CALCIUM 10.1 06/01/2017 0756   PROT 7.8 10/07/2018 1237   PROT 7.7 06/01/2017 0756   ALBUMIN 4.3 10/07/2018 1237   ALBUMIN 3.9 06/01/2017 0756   AST 49 (H) 10/07/2018 1237   AST 33 05/04/2018 0922   AST 35 (H) 06/01/2017 0756   ALT 60 (H) 10/07/2018 1237   ALT 30 05/04/2018 0922   ALT 28 06/01/2017 0756   ALKPHOS 75 10/07/2018 1237   ALKPHOS 52 06/01/2017 0756   BILITOT 0.6 10/07/2018 1237   BILITOT 0.5 05/04/2018 0922   BILITOT 0.58 06/01/2017 0756   GFRNONAA 50 (L) 10/07/2018 1237   GFRNONAA 52 (L) 05/04/2018 0922   GFRAA 58 (L) 10/07/2018 1237   GFRAA 60 (L) 05/04/2018 0922    No results found for: SPEP, UPEP  Lab Results  Component Value Date   WBC 6.4 05/08/2020   NEUTROABS 3.2 05/08/2020   HGB 14.8 05/08/2020   HCT 45.4 05/08/2020   MCV 90.3 05/08/2020   PLT 155 05/08/2020      Chemistry      Component Value Date/Time   NA 144 10/07/2018 1237   NA 142 06/01/2017 0756   K 4.3 10/07/2018 1237   K 4.2 06/01/2017 0756   CL 107 10/07/2018 1237   CL 109 (H) 01/20/2013 0801   CO2 27 10/07/2018 1237   CO2 21 (L) 06/01/2017 0756   BUN 15 10/07/2018 1237   BUN 14.5 06/01/2017 0756   CREATININE 1.10 (H) 10/07/2018 1237   CREATININE 1.06 (H) 05/04/2018 0922   CREATININE 1.0 06/01/2017 0756   GLU 92 06/17/2013 0000      Component Value Date/Time   CALCIUM 10.2 10/07/2018 1237   CALCIUM 10.1 06/01/2017 0756   ALKPHOS 75 10/07/2018 1237   ALKPHOS 52 06/01/2017 0756   AST 49 (H) 10/07/2018 1237   AST 33 05/04/2018 0922   AST 35 (H) 06/01/2017 0756   ALT 60 (H) 10/07/2018 1237   ALT 30 05/04/2018 0922   ALT 28 06/01/2017 0756   BILITOT 0.6 10/07/2018 1237   BILITOT 0.5 05/04/2018 0922   BILITOT 0.58 06/01/2017  0756

## 2020-05-08 NOTE — Assessment & Plan Note (Signed)
She will continue close monitoring with dermatologist She is at risk of skin cancer due to CLL

## 2020-05-08 NOTE — Telephone Encounter (Signed)
[facility] and given verbal order per Dr. Alvy Bimler for IVIG monthly x 6 months, then stop. She verbalized understanding.

## 2020-05-08 NOTE — Telephone Encounter (Signed)
Scheduled per 9/14 sch msg. Called and left a msg, mailing appt letter and calendar printout

## 2020-05-08 NOTE — Telephone Encounter (Signed)
Called and left a message asking Pam with [facility] to call the office back.

## 2020-05-08 NOTE — Assessment & Plan Note (Signed)
She is noted to have excessive weight gain I recommend dietary modification and lifestyle changes We discussed the role of referral to weight management center if she is interested

## 2020-05-08 NOTE — Assessment & Plan Note (Signed)
She has no signs or symptoms of CLL recurrence We will continue close monitoring

## 2020-05-08 NOTE — Assessment & Plan Note (Addendum)
She has acquired hypogammaglobulinemia due to CLL and prior treatment This is a lifelong problem According to the patient, when she receives IVIG, it reduces his risk of sinus infection and severe infection The patient tolerated intravenous immunoglobulin treatment well at home. We discussed the risk and benefits for prophylactic IVIG infusion to prevent recurrent infection during the wintertime and she would like to proceed She will get monthly IVIG infusion, 40 g IV through advanced home care service We will inform them to proceed with infusion for 6 months and then stop She will continue premedication before each IVIG infusion

## 2020-05-09 ENCOUNTER — Telehealth: Payer: Self-pay

## 2020-05-09 LAB — IGG, IGA, IGM
IgA: <5 mg/dL — ABNORMAL LOW (ref 64–422)
IgG (Immunoglobin G), Serum: 252 mg/dL — ABNORMAL LOW (ref 586–1602)
IgM (Immunoglobulin M), Srm: <5 mg/dL — ABNORMAL LOW (ref 26–217)

## 2020-05-09 NOTE — Telephone Encounter (Signed)
Called and given below message. She verbalized understanding. 

## 2020-05-09 NOTE — Telephone Encounter (Signed)
-----   Message from Heath Lark, MD sent at 05/09/2020  8:40 AM EDT ----- Regarding: pls let her know immunoglobulin levels are low as expected

## 2020-06-27 ENCOUNTER — Telehealth: Payer: Self-pay

## 2020-06-27 NOTE — Telephone Encounter (Signed)
Bianca Parker from [facility] called to inform Bianca Parker that Pt will be receiving her next IVIG on 07/09/20. Pt's last dose was 05/16/20 she wanted to let Bianca Parker know it was delayed because Pt stated she had elevated liver enzymes and wanted to wait. Bianca Parker informed.

## 2020-06-27 NOTE — Telephone Encounter (Signed)
ERROR

## 2020-11-06 ENCOUNTER — Encounter: Payer: Self-pay | Admitting: Hematology and Oncology

## 2021-02-28 ENCOUNTER — Other Ambulatory Visit: Payer: Self-pay

## 2021-02-28 MED ORDER — ACYCLOVIR 400 MG PO TABS
400.0000 mg | ORAL_TABLET | Freq: Two times a day (BID) | ORAL | 3 refills | Status: DC
Start: 2021-02-28 — End: 2022-07-21

## 2021-05-09 ENCOUNTER — Inpatient Hospital Stay: Payer: Medicare Other | Attending: Hematology and Oncology | Admitting: Hematology and Oncology

## 2021-05-09 ENCOUNTER — Encounter: Payer: Self-pay | Admitting: Hematology and Oncology

## 2021-05-09 ENCOUNTER — Other Ambulatory Visit: Payer: Self-pay

## 2021-05-09 ENCOUNTER — Inpatient Hospital Stay: Payer: Medicare Other

## 2021-05-09 DIAGNOSIS — D801 Nonfamilial hypogammaglobulinemia: Secondary | ICD-10-CM

## 2021-05-09 DIAGNOSIS — C9110 Chronic lymphocytic leukemia of B-cell type not having achieved remission: Secondary | ICD-10-CM | POA: Diagnosis present

## 2021-05-09 DIAGNOSIS — Z79899 Other long term (current) drug therapy: Secondary | ICD-10-CM | POA: Diagnosis not present

## 2021-05-09 DIAGNOSIS — C9111 Chronic lymphocytic leukemia of B-cell type in remission: Secondary | ICD-10-CM

## 2021-05-09 DIAGNOSIS — Z856 Personal history of leukemia: Secondary | ICD-10-CM | POA: Diagnosis not present

## 2021-05-09 LAB — CBC WITH DIFFERENTIAL/PLATELET
Abs Immature Granulocytes: 0.01 10*3/uL (ref 0.00–0.07)
Basophils Absolute: 0 10*3/uL (ref 0.0–0.1)
Basophils Relative: 0 %
Eosinophils Absolute: 0.1 10*3/uL (ref 0.0–0.5)
Eosinophils Relative: 1 %
HCT: 45.5 % (ref 36.0–46.0)
Hemoglobin: 15 g/dL (ref 12.0–15.0)
Immature Granulocytes: 0 %
Lymphocytes Relative: 33 %
Lymphs Abs: 2.2 10*3/uL (ref 0.7–4.0)
MCH: 29.7 pg (ref 26.0–34.0)
MCHC: 33 g/dL (ref 30.0–36.0)
MCV: 90.1 fL (ref 80.0–100.0)
Monocytes Absolute: 0.5 10*3/uL (ref 0.1–1.0)
Monocytes Relative: 8 %
Neutro Abs: 3.9 10*3/uL (ref 1.7–7.7)
Neutrophils Relative %: 58 %
Platelets: 166 10*3/uL (ref 150–400)
RBC: 5.05 MIL/uL (ref 3.87–5.11)
RDW: 13.3 % (ref 11.5–15.5)
WBC: 6.7 10*3/uL (ref 4.0–10.5)
nRBC: 0 % (ref 0.0–0.2)

## 2021-05-09 NOTE — Assessment & Plan Note (Signed)
She has no signs or symptoms of CLL recurrence We will continue close monitoring

## 2021-05-09 NOTE — Progress Notes (Signed)
[facility] OFFICE PROGRESS NOTE  Patient Care Team: Tisovec, Fransico Him, MD as PCP - General (Internal Medicine) Deneise Lever, MD as Referring Physician (Pulmonary Disease) Rozetta Nunnery, MD as Referring Physician (Otolaryngology) Milus Banister, MD (Gastroenterology) Arvella Nigh, MD (Obstetrics and Gynecology) Heath Lark, MD as Consulting Physician (Hematology and Oncology) Jari Pigg, MD as Consulting Physician (Dermatology) Marygrace Drought, MD as Consulting Physician (Ophthalmology)  ASSESSMENT & PLAN:  Personal history of CLL (chronic lymphocytic leukemia) She has no signs or symptoms of CLL recurrence We will continue close monitoring  Hypogammaglobulinemia, acquired [facility]) She has acquired hypogammaglobulinemia due to CLL and prior treatment This is a lifelong problem According to the patient, when she receives IVIG, it reduces her risks of sinus infection and severe infection The patient tolerated intravenous immunoglobulin treatment well at home. Due to recent changes with healthcare management, she stopped receiving IVIG this year We have extensive discussion about the risk and benefits of continuing IVIG treatment The patient appears to be somewhat concerned about returning here for IVIG because of exposure She did well in the past year without severe infection or hospitalization She will go home and think about it if she is interested to return here for IVIG treatment Otherwise, I will see her once a year for further follow-up We discussed importance of annual influenza vaccination The patient would qualify for booster pneumococcal 23 vaccination in the recommend she discuss this with her primary care doctor   No orders of the defined types were placed in this encounter.   All questions were answered. The patient knows to call the clinic with any problems, questions or concerns. The total time spent in the appointment was 20 minutes  encounter with patients including review of chart and various tests results, discussions about plan of care and coordination of care plan   Heath Lark, MD 05/09/2021 10:21 AM  INTERVAL HISTORY: Please see below for problem oriented charting. she returns for follow-up on history of CLL and acquired hypogammaglobulinemia This year, she stopped receiving IVIG at home due to changes in healthcare management She has been vigilant about social distancing and careful about exposure She had tooth infection twice but otherwise, has been healthy She managed to lose weight since last time I saw her  REVIEW OF SYSTEMS:   Constitutional: Denies fevers, chills or abnormal weight loss Eyes: Denies blurriness of vision Ears, nose, mouth, throat, and face: Denies mucositis or sore throat Respiratory: Denies cough, dyspnea or wheezes Cardiovascular: Denies palpitation, chest discomfort or lower extremity swelling Gastrointestinal:  Denies nausea, heartburn or change in bowel habits Skin: Denies abnormal skin rashes Lymphatics: Denies new lymphadenopathy or easy bruising Neurological:Denies numbness, tingling or new weaknesses Behavioral/Psych: Mood is stable, no new changes  All other systems were reviewed with the patient and are negative.  I have reviewed the past medical history, past surgical history, social history and family history with the patient and they are unchanged from previous note.  ALLERGIES:  is allergic to penicillins, pentamidine, and sulfonamide derivatives.  MEDICATIONS:  Current Outpatient Medications  Medication Sig Dispense Refill   acyclovir (ZOVIRAX) 400 MG tablet Take 1 tablet (400 mg total) by mouth 2 (two) times daily. 180 tablet 3   atorvastatin (LIPITOR) 10 MG tablet Take 10 mg by mouth daily.     Calcium Carbonate-Vitamin D (CALTRATE 600+D) 600-400 MG-UNIT per tablet Take 1 tablet by mouth daily.     famotidine (PEPCID) 10 MG tablet Take 10 mg by mouth  daily.      fexofenadine (ALLEGRA) 180 MG tablet Take 180 mg by mouth daily.       levothyroxine (SYNTHROID, LEVOTHROID) 50 MCG tablet Take 50 mcg by mouth daily before breakfast.      Multiple Vitamin (MULTIVITAMIN) tablet Take 1 tablet by mouth daily.     mupirocin ointment (BACTROBAN) 2 % Apply 1 application topically as needed.      omeprazole (PRILOSEC) 20 MG capsule Take 20 mg by mouth daily.     Current Facility-Administered Medications  Medication Dose Route Frequency Provider Last Rate Last Admin   0.9 %  sodium chloride infusion  500 mL Intravenous Once Milus Banister, MD       Facility-Administered Medications Ordered in Other Visits  Medication Dose Route Frequency Provider Last Rate Last Admin   methylPREDNISolone sodium succinate (SOLU-MEDROL) 40 mg/mL injection 20 mg  20 mg Intravenous Q30 days Heath Lark, MD   20 mg at 02/09/17 0920    SUMMARY OF ONCOLOGIC HISTORY:  She was in one of the first cohorts of patients treated with FCR on protocol at the [facility] at time of her initial diagnosis in July 2002. Technically she had RAI  stage III disease due to anemia but she had no organomegaly or lymphadenopathy on exam. FCR was started in 12/2001, and continued through 06/2002. She achieved a complete hematologic response and,which has been durable now for over 11 years!  She did develop hypogammaglobulinemia. As a result, she gets recurrent bronchitis and sinusitis. In April 2013 she was started her on monthly intravenous immunoglobulin 400 mg per kilogram and this has significantly impacted on the frequency and severity of her infections. This was continued through April of 2014. She has had no new infections since stopping the IVIG but as expected, immunoglobulin levels have fallen to pretreatment levels.   She has a history of her recurrent squamous cell carcinomas of her skin and has had 2 lesions excised from her left tibial area and another 1 excised from the vulvar  area. Recently, she had a basal cell carcinoma removed. She has a history of both herpes zoster and herpes simplex infections. Recently, she had abnormal Pap smear and currently on close observation. Recent colposcopy was negative.  She received IVIG treatment for acquired hypogammaglobulinemia but stopped in April 2014 due to sick serum syndrome In July 2016, IVIG is resumed with premedication In August 2016, she developed cellulitis from venipuncture, resolved with antibiotics From 2016 to 2021, she continues to have recurrent infection requiring antibiotic therapy.  She is also receiving IVIG treatment that helps reduce the risk of severe infection.  PHYSICAL EXAMINATION: ECOG PERFORMANCE STATUS: 0 - Asymptomatic  Vitals:   05/09/21 0843  BP: (!) 153/70  Pulse: 85  Resp: 18  Temp: (!) 97 F (36.1 C)  SpO2: 98%   Filed Weights   05/09/21 0843  Weight: 187 lb 9.6 oz (85.1 kg)    GENERAL:alert, no distress and comfortable NEURO: alert & oriented x 3 with fluent speech, no focal motor/sensory deficits  LABORATORY DATA:  I have reviewed the data as listed    Component Value Date/Time   NA 144 10/07/2018 1237   NA 142 06/01/2017 0756   K 4.3 10/07/2018 1237   K 4.2 06/01/2017 0756   CL 107 10/07/2018 1237   CL 109 (H) 01/20/2013 0801   CO2 27 10/07/2018 1237   CO2 21 (L) 06/01/2017 0756   GLUCOSE 101 (H) 10/07/2018 1237  GLUCOSE 88 06/01/2017 0756   GLUCOSE 89 01/20/2013 0801   BUN 15 10/07/2018 1237   BUN 14.5 06/01/2017 0756   CREATININE 1.10 (H) 10/07/2018 1237   CREATININE 1.06 (H) 05/04/2018 0922   CREATININE 1.0 06/01/2017 0756   CALCIUM 10.2 10/07/2018 1237   CALCIUM 10.1 06/01/2017 0756   PROT 7.8 10/07/2018 1237   PROT 7.7 06/01/2017 0756   ALBUMIN 4.3 10/07/2018 1237   ALBUMIN 3.9 06/01/2017 0756   AST 49 (H) 10/07/2018 1237   AST 33 05/04/2018 0922   AST 35 (H) 06/01/2017 0756   ALT 60 (H) 10/07/2018 1237   ALT 30 05/04/2018 0922   ALT 28 06/01/2017  0756   ALKPHOS 75 10/07/2018 1237   ALKPHOS 52 06/01/2017 0756   BILITOT 0.6 10/07/2018 1237   BILITOT 0.5 05/04/2018 0922   BILITOT 0.58 06/01/2017 0756   GFRNONAA 50 (L) 10/07/2018 1237   GFRNONAA 52 (L) 05/04/2018 0922   GFRAA 58 (L) 10/07/2018 1237   GFRAA 60 (L) 05/04/2018 0922    No results found for: SPEP, UPEP  Lab Results  Component Value Date   WBC 6.7 05/09/2021   NEUTROABS 3.9 05/09/2021   HGB 15.0 05/09/2021   HCT 45.5 05/09/2021   MCV 90.1 05/09/2021   PLT 166 05/09/2021      Chemistry      Component Value Date/Time   NA 144 10/07/2018 1237   NA 142 06/01/2017 0756   K 4.3 10/07/2018 1237   K 4.2 06/01/2017 0756   CL 107 10/07/2018 1237   CL 109 (H) 01/20/2013 0801   CO2 27 10/07/2018 1237   CO2 21 (L) 06/01/2017 0756   BUN 15 10/07/2018 1237   BUN 14.5 06/01/2017 0756   CREATININE 1.10 (H) 10/07/2018 1237   CREATININE 1.06 (H) 05/04/2018 0922   CREATININE 1.0 06/01/2017 0756   GLU 92 06/17/2013 0000      Component Value Date/Time   CALCIUM 10.2 10/07/2018 1237   CALCIUM 10.1 06/01/2017 0756   ALKPHOS 75 10/07/2018 1237   ALKPHOS 52 06/01/2017 0756   AST 49 (H) 10/07/2018 1237   AST 33 05/04/2018 0922   AST 35 (H) 06/01/2017 0756   ALT 60 (H) 10/07/2018 1237   ALT 30 05/04/2018 0922   ALT 28 06/01/2017 0756   BILITOT 0.6 10/07/2018 1237   BILITOT 0.5 05/04/2018 0922   BILITOT 0.58 06/01/2017 0756

## 2021-05-09 NOTE — Assessment & Plan Note (Signed)
She has acquired hypogammaglobulinemia due to CLL and prior treatment This is a lifelong problem According to the patient, when she receives IVIG, it reduces her risks of sinus infection and severe infection The patient tolerated intravenous immunoglobulin treatment well at home. Due to recent changes with healthcare management, she stopped receiving IVIG this year We have extensive discussion about the risk and benefits of continuing IVIG treatment The patient appears to be somewhat concerned about returning here for IVIG because of exposure She did well in the past year without severe infection or hospitalization She will go home and think about it if she is interested to return here for IVIG treatment Otherwise, I will see her once a year for further follow-up We discussed importance of annual influenza vaccination The patient would qualify for booster pneumococcal 23 vaccination in the recommend she discuss this with her primary care doctor

## 2021-05-10 LAB — IGG: IgG (Immunoglobin G), Serum: 123 mg/dL — ABNORMAL LOW (ref 586–1602)

## 2021-06-03 ENCOUNTER — Other Ambulatory Visit: Payer: Self-pay | Admitting: Hematology and Oncology

## 2021-06-03 ENCOUNTER — Encounter: Payer: Self-pay | Admitting: Hematology and Oncology

## 2021-06-03 DIAGNOSIS — D801 Nonfamilial hypogammaglobulinemia: Secondary | ICD-10-CM

## 2021-06-03 MED ORDER — ALBUTEROL SULFATE HFA 108 (90 BASE) MCG/ACT IN AERS: 2.0000 | INHALATION_SPRAY | Freq: Once | RESPIRATORY_TRACT | Status: DC | PRN

## 2021-06-03 MED ORDER — TIXAGEVIMAB (PART OF EVUSHELD) INJECTION: 300.0000 mg | Freq: Once | INTRAMUSCULAR | Status: DC

## 2021-06-03 MED ORDER — METHYLPREDNISOLONE SODIUM SUCC 125 MG IJ SOLR: 125.0000 mg | Freq: Once | INTRAMUSCULAR | Status: DC | PRN

## 2021-06-03 MED ORDER — EPINEPHRINE 0.3 MG/0.3ML IJ SOAJ: 0.3000 mg | Freq: Once | INTRAMUSCULAR | Status: DC | PRN

## 2021-06-03 MED ORDER — CILGAVIMAB (PART OF EVUSHELD) INJECTION: 300.0000 mg | Freq: Once | INTRAMUSCULAR | Status: DC

## 2021-06-03 MED ORDER — DIPHENHYDRAMINE HCL 50 MG/ML IJ SOLN: 50.0000 mg | Freq: Once | INTRAMUSCULAR | Status: DC | PRN

## 2021-06-10 ENCOUNTER — Inpatient Hospital Stay: Payer: Medicare Other | Attending: Hematology and Oncology

## 2021-06-10 ENCOUNTER — Other Ambulatory Visit: Payer: Self-pay

## 2021-06-10 DIAGNOSIS — D801 Nonfamilial hypogammaglobulinemia: Secondary | ICD-10-CM

## 2021-06-10 MED ORDER — CILGAVIMAB (PART OF EVUSHELD) INJECTION
300.0000 mg | Freq: Once | INTRAMUSCULAR | Status: AC
  Administered 2021-06-10: 300 mg via INTRAMUSCULAR
  Filled 2021-06-10: qty 3

## 2021-06-10 MED ORDER — TIXAGEVIMAB (PART OF EVUSHELD) INJECTION
300.0000 mg | Freq: Once | INTRAMUSCULAR | Status: AC
  Administered 2021-06-10: 300 mg via INTRAMUSCULAR
  Filled 2021-06-10: qty 3

## 2021-06-10 NOTE — Patient Instructions (Signed)
Tixagevimab; Cilgavimab Solutions for Injection What is this medication? TIXAGEVIMAB; CILGAVIMAB (tix a jev i mab; sil gav i mab) reduces the risk of getting COVID-19 in persons with immune system problems who may not respond properly to the COVID-19 vaccine or persons who can't receive the COVID-19 vaccine. It belongs to a group of medications called monoclonal antibodies. It may decrease the risk of developing severe symptoms of COVID-19. It may also decrease the chance of going to the hospital. This medication is not approved by the FDA. The FDA has authorized emergency use of this medication during the COVID-19 pandemic. This medicine may be used for other purposes; ask your health care provider or pharmacist if you have questions. COMMON BRAND NAME(S): EVUSHELD What should I tell my care team before I take this medication? They need to know if you have any of these conditions: any allergies any serious illness bleeding disorder have received a COVID-19 vaccine heart disease an unusual or allergic reaction to tixagevimab, cilgavimab, other medications, foods, dyes, or preservatives pregnant or trying to get pregnant breast-feeding How should I use this medication? This medication is injected into a muscle. It is given by your care team in a hospital or clinic setting. Talk to your care team about the use of this medication in children. While it may be given to children as young as 12 years, precautions do apply. Overdosage: If you think you have taken too much of this medicine contact a poison control center or emergency room at once. NOTE: This medicine is only for you. Do not share this medicine with others. What if I miss a dose? Keep appointments for follow-up doses. It is important not to miss your dose. Call your care team if you are unable to keep an appointment. What may interact with this medication? COVID-19 vaccines This list may not describe all possible interactions. Give  your health care provider a list of all the medicines, herbs, non-prescription drugs, or dietary supplements you use. Also tell them if you smoke, drink alcohol, or use illegal drugs. Some items may interact with your medicine. What should I watch for while using this medication? Your condition will be monitored carefully while you are receiving this medication. Visit your care team for regular checks on your progress. Tell your care team if your symptoms do not start to get better or if they get worse. After receiving this medication, wait at least 14 days before getting the COVID-19 vaccine. What side effects may I notice from receiving this medication? Side effects that you should report to your care team as soon as possible: Allergic reactions-skin rash, itching, hives, swelling of the face, lips, tongue, or throat Heart attack-pain or tightness in the chest, shoulders, arms, or jaw, nausea, shortness of breath, cold or clammy skin, feeling faint or lightheaded Heart failure-shortness of breath, swelling of the ankles, feet, or hands, sudden weight gain, unusual weakness or fatigue Heart rhythm changes-fast or irregular heartbeat, dizziness, feeling faint or lightheaded, chest pain, trouble breathing Side effects that usually do not require medical attention (report these to your care team if they continue or are bothersome): Cough Fatigue Headache Pain, redness, or irritation at site where injected This list may not describe all possible side effects. Call your doctor for medical advice about side effects. You may report side effects to FDA at 1-800-FDA-1088. Where should I keep my medication? This medication is given in a hospital or clinic. It will not be stored at home. NOTE: This sheet is  a summary. It may not cover all possible information. If you have questions about this medicine, talk to your doctor, pharmacist, or health care provider.  2022 Elsevier/Gold Standard (2020-08-02  16:22:16)

## 2021-06-10 NOTE — Progress Notes (Signed)
Patient stayed for 30 minute post Evusheld observation, no reaction noted, DC'd home in stable condition.

## 2021-06-13 ENCOUNTER — Other Ambulatory Visit: Payer: Self-pay | Admitting: Hematology and Oncology

## 2021-06-13 ENCOUNTER — Other Ambulatory Visit: Payer: Self-pay

## 2021-06-13 DIAGNOSIS — Z1231 Encounter for screening mammogram for malignant neoplasm of breast: Secondary | ICD-10-CM

## 2021-06-13 MED ORDER — INFLUENZA VAC A&B SA ADJ QUAD 0.5 ML IM PRSY
PREFILLED_SYRINGE | INTRAMUSCULAR | 0 refills | Status: DC
  Filled 2021-06-13: qty 0.5, 1d supply, fill #0

## 2021-06-20 ENCOUNTER — Ambulatory Visit: Payer: Medicare Other | Attending: Internal Medicine

## 2021-06-20 ENCOUNTER — Other Ambulatory Visit: Payer: Self-pay

## 2021-06-20 DIAGNOSIS — Z23 Encounter for immunization: Secondary | ICD-10-CM

## 2021-06-20 MED ORDER — MODERNA COVID-19 BIVAL BOOSTER 50 MCG/0.5ML IM SUSP
INTRAMUSCULAR | 0 refills | Status: DC
  Filled 2021-06-20: qty 0.5, 1d supply, fill #0

## 2021-06-20 NOTE — Progress Notes (Signed)
   Covid-19 Vaccination Clinic  Name:  ELANDA GARMANY    MRN: [id number] DOB: [birth year]  06/20/2021  Ms. Hensler was observed post Covid-19 immunization for 30 minutes based on pre-vaccination screening without incident. She was provided with Vaccine Information Sheet and instruction to access the V-Safe system.   Ms. Manton was instructed to call 911 with any severe reactions post vaccine: Difficulty breathing  Swelling of face and throat  A fast heartbeat  A bad rash all over body  Dizziness and weakness   Immunizations Administered     Name Date Dose VIS Date Route   Moderna Covid-19 vaccine Bivalent Booster 06/20/2021  9:03 AM 0.5 mL 04/06/2021 Intramuscular   Manufacturer: Moderna   Lot: [id number]   [place]: [id number]

## 2021-07-16 ENCOUNTER — Ambulatory Visit
Admission: RE | Admit: 2021-07-16 | Discharge: 2021-07-16 | Disposition: A | Discharge status: Home / Self Care | Payer: Medicare Other | Source: Ambulatory Visit | Attending: Hematology and Oncology | Admitting: Hematology and Oncology

## 2021-07-16 ENCOUNTER — Other Ambulatory Visit: Payer: Self-pay

## 2021-07-16 DIAGNOSIS — Z1231 Encounter for screening mammogram for malignant neoplasm of breast: Secondary | ICD-10-CM

## 2022-05-08 ENCOUNTER — Other Ambulatory Visit: Payer: Self-pay | Admitting: Hematology and Oncology

## 2022-05-08 DIAGNOSIS — D801 Nonfamilial hypogammaglobulinemia: Secondary | ICD-10-CM

## 2022-05-08 DIAGNOSIS — Z856 Personal history of leukemia: Secondary | ICD-10-CM

## 2022-05-09 ENCOUNTER — Encounter: Payer: Self-pay | Admitting: Hematology and Oncology

## 2022-05-09 ENCOUNTER — Inpatient Hospital Stay (HOSPITAL_BASED_OUTPATIENT_CLINIC_OR_DEPARTMENT_OTHER): Payer: Medicare Other | Admitting: Hematology and Oncology

## 2022-05-09 ENCOUNTER — Other Ambulatory Visit: Payer: Self-pay

## 2022-05-09 ENCOUNTER — Inpatient Hospital Stay: Payer: Medicare Other | Attending: Hematology and Oncology

## 2022-05-09 VITALS — BP 150/75 | HR 79 | Temp 97.9°F | Resp 18 | Ht 63.0 in | Wt 196.1 lb

## 2022-05-09 DIAGNOSIS — D0471 Carcinoma in situ of skin of right lower limb, including hip: Secondary | ICD-10-CM

## 2022-05-09 DIAGNOSIS — Z856 Personal history of leukemia: Secondary | ICD-10-CM

## 2022-05-09 DIAGNOSIS — C9110 Chronic lymphocytic leukemia of B-cell type not having achieved remission: Secondary | ICD-10-CM | POA: Insufficient documentation

## 2022-05-09 DIAGNOSIS — D0470 Carcinoma in situ of skin of unspecified lower limb, including hip: Secondary | ICD-10-CM | POA: Insufficient documentation

## 2022-05-09 DIAGNOSIS — C801 Malignant (primary) neoplasm, unspecified: Secondary | ICD-10-CM | POA: Diagnosis present

## 2022-05-09 DIAGNOSIS — D801 Nonfamilial hypogammaglobulinemia: Secondary | ICD-10-CM

## 2022-05-09 DIAGNOSIS — Z299 Encounter for prophylactic measures, unspecified: Secondary | ICD-10-CM | POA: Diagnosis not present

## 2022-05-09 LAB — CMP (CANCER CENTER ONLY)
ALT: 39 U/L (ref 0–44)
AST: 37 U/L (ref 15–41)
Albumin: 4.6 g/dL (ref 3.5–5.0)
Alkaline Phosphatase: 43 U/L (ref 38–126)
Anion gap: 7 (ref 5–15)
BUN: 19 mg/dL (ref 8–23)
CO2: 27 mmol/L (ref 22–32)
Calcium: 9.8 mg/dL (ref 8.9–10.3)
Chloride: 111 mmol/L (ref 98–111)
Creatinine: 0.88 mg/dL (ref 0.44–1.00)
GFR, Estimated: >60 mL/min (ref >60)
Glucose, Bld: 107 mg/dL — ABNORMAL HIGH (ref 70–99)
Potassium: 4 mmol/L (ref 3.5–5.1)
Sodium: 145 mmol/L (ref 135–145)
Total Bilirubin: 1.3 mg/dL — ABNORMAL HIGH (ref 0.3–1.2)
Total Protein: 6.7 g/dL (ref 6.5–8.1)

## 2022-05-09 LAB — CBC WITH DIFFERENTIAL (CANCER CENTER ONLY)
Abs Immature Granulocytes: 0.01 10*3/uL (ref 0.00–0.07)
Basophils Absolute: 0 10*3/uL (ref 0.0–0.1)
Basophils Relative: 1 %
Eosinophils Absolute: 0.1 10*3/uL (ref 0.0–0.5)
Eosinophils Relative: 2 %
HCT: 44.2 % (ref 36.0–46.0)
Hemoglobin: 14.7 g/dL (ref 12.0–15.0)
Immature Granulocytes: 0 %
Lymphocytes Relative: 34 %
Lymphs Abs: 1.9 10*3/uL (ref 0.7–4.0)
MCH: 30.5 pg (ref 26.0–34.0)
MCHC: 33.3 g/dL (ref 30.0–36.0)
MCV: 91.7 fL (ref 80.0–100.0)
Monocytes Absolute: 0.5 10*3/uL (ref 0.1–1.0)
Monocytes Relative: 9 %
Neutro Abs: 3 10*3/uL (ref 1.7–7.7)
Neutrophils Relative %: 54 %
Platelet Count: 157 10*3/uL (ref 150–400)
RBC: 4.82 MIL/uL (ref 3.87–5.11)
RDW: 13.2 % (ref 11.5–15.5)
WBC Count: 5.5 10*3/uL (ref 4.0–10.5)
nRBC: 0 % (ref 0.0–0.2)

## 2022-05-09 NOTE — Assessment & Plan Note (Signed)
We spent some time reviewing vaccination program

## 2022-05-09 NOTE — Assessment & Plan Note (Signed)
She has acquired hypogammaglobulinemia due to CLL and prior treatment This is a lifelong problem According to the patient, when she receives IVIG, it reduces her risks of sinus infection and severe infection The patient tolerated intravenous immunoglobulin treatment well at home. Due to recent changes with healthcare management, she has stopped receiving IVIG at home We have extensive discussion about the risk and benefits of continuing IVIG treatment Ultimately, she is in agreement to return him monthly for IVIG at 1 g/kg every 4 weeks for 5 to 6 months I will give her additional premedications to avoid risk of reaction to treatment

## 2022-05-09 NOTE — Assessment & Plan Note (Signed)
She has no signs or symptoms of CLL recurrence We will continue close monitoring She does not need routine surveillance imaging

## 2022-05-09 NOTE — Progress Notes (Signed)
[place] OFFICE PROGRESS NOTE  Patient Care Team: Tisovec, Fransico Him, MD as PCP - General (Internal Medicine) Deneise Lever, MD as Referring Physician (Pulmonary Disease) Rozetta Nunnery, MD (Inactive) as Referring Physician (Otolaryngology) Milus Banister, MD (Gastroenterology) Arvella Nigh, MD (Obstetrics and Gynecology) Heath Lark, MD as Consulting Physician (Hematology and Oncology) Jari Pigg, MD as Consulting Physician (Dermatology) Marygrace Drought, MD as Consulting Physician (Ophthalmology)  ASSESSMENT & PLAN:  Personal history of CLL (chronic lymphocytic leukemia) She has no signs or symptoms of CLL recurrence We will continue close monitoring She does not need routine surveillance imaging  Hypogammaglobulinemia, acquired [facility]) She has acquired hypogammaglobulinemia due to CLL and prior treatment This is a lifelong problem According to the patient, when she receives IVIG, it reduces her risks of sinus infection and severe infection The patient tolerated intravenous immunoglobulin treatment well at home. Due to recent changes with healthcare management, she has stopped receiving IVIG at home We have extensive discussion about the risk and benefits of continuing IVIG treatment Ultimately, she is in agreement to return him monthly for IVIG at 1 g/kg every 4 weeks for 5 to 6 months I will give her additional premedications to avoid risk of reaction to treatment  Preventive measure We spent some time reviewing vaccination program  No orders of the defined types were placed in this encounter.   All questions were answered. The patient knows to call the clinic with any problems, questions or concerns. The total time spent in the appointment was 40 minutes encounter with patients including review of chart and various tests results, discussions about plan of care and coordination of care plan   Heath Lark, MD 05/09/2022 12:08 PM  INTERVAL  HISTORY: Please see below for problem oriented charting. she returns for surveillance follow-up for history of CLL and acquired hypogammaglobulinemia secondary to prior treatment Over the past year, she has significant congestion, cough and shortness of breath with recurrent sinusitis and chest infections She is interested to resume IVIG treatment  REVIEW OF SYSTEMS:   Constitutional: Denies fevers, chills or abnormal weight loss Eyes: Denies blurriness of vision Ears, nose, mouth, throat, and face: Denies mucositis or sore throat Respiratory: Denies cough, dyspnea or wheezes Cardiovascular: Denies palpitation, chest discomfort or lower extremity swelling Gastrointestinal:  Denies nausea, heartburn or change in bowel habits Skin: Denies abnormal skin rashes Lymphatics: Denies new lymphadenopathy or easy bruising Neurological:Denies numbness, tingling or new weaknesses Behavioral/Psych: Mood is stable, no new changes  All other systems were reviewed with the patient and are negative.  I have reviewed the past medical history, past surgical history, social history and family history with the patient and they are unchanged from previous note.  ALLERGIES:  is allergic to penicillins, pentamidine, and sulfonamide derivatives.  MEDICATIONS:  Current Outpatient Medications  Medication Sig Dispense Refill   acyclovir (ZOVIRAX) 400 MG tablet Take 1 tablet (400 mg total) by mouth 2 (two) times daily. 180 tablet 3   atorvastatin (LIPITOR) 10 MG tablet Take 10 mg by mouth daily.     Calcium Carbonate-Vitamin D (CALTRATE 600+D) 600-400 MG-UNIT per tablet Take 1 tablet by mouth daily.     famotidine (PEPCID) 10 MG tablet Take 10 mg by mouth daily.     fexofenadine (ALLEGRA) 180 MG tablet Take 180 mg by mouth daily.       levothyroxine (SYNTHROID, LEVOTHROID) 50 MCG tablet Take 50 mcg by mouth daily before breakfast.      Multiple Vitamin (MULTIVITAMIN) tablet  Take 1 tablet by mouth daily.      mupirocin ointment (BACTROBAN) 2 % Apply 1 application topically as needed.      Current Facility-Administered Medications  Medication Dose Route Frequency Provider Last Rate Last Admin   0.9 %  sodium chloride infusion  500 mL Intravenous Once Milus Banister, MD       Facility-Administered Medications Ordered in Other Visits  Medication Dose Route Frequency Provider Last Rate Last Admin   diphenhydrAMINE (BENADRYL) injection 50 mg  50 mg Intramuscular Once PRN Alvy Bimler, Rowen Wilmer, MD       methylPREDNISolone sodium succinate (SOLU-MEDROL) 40 mg/mL injection 20 mg  20 mg Intravenous Q30 days Heath Lark, MD   20 mg at 02/09/17 0920    SUMMARY OF ONCOLOGIC HISTORY:  She was in one of the first cohorts of patients treated with FCR on protocol at the [facility] at time of her initial diagnosis in July 2002. Technically she had RAI  stage III disease due to anemia but she had no organomegaly or lymphadenopathy on exam. FCR was started in 12/2001, and continued through 06/2002. She achieved a complete hematologic response and,which has been durable now for over 11 years!  She did develop hypogammaglobulinemia. As a result, she gets recurrent bronchitis and sinusitis. In April 2013 she was started her on monthly intravenous immunoglobulin 400 mg per kilogram and this has significantly impacted on the frequency and severity of her infections. This was continued through April of 2014. She has had no new infections since stopping the IVIG but as expected, immunoglobulin levels have fallen to pretreatment levels.   She has a history of her recurrent squamous cell carcinomas of her skin and has had 2 lesions excised from her left tibial area and another 1 excised from the vulvar area. Recently, she had a basal cell carcinoma removed. She has a history of both herpes zoster and herpes simplex infections. Recently, she had abnormal Pap smear and currently on close observation. Recent colposcopy was  negative.  She received IVIG treatment for acquired hypogammaglobulinemia but stopped in April 2014 due to sick serum syndrome In July 2016, IVIG is resumed with premedication In August 2016, she developed cellulitis from venipuncture, resolved with antibiotics From 2016 to 2021, she continues to have recurrent infection requiring antibiotic therapy.  She is also receiving IVIG treatment that helps reduce the risk of severe infection.  PHYSICAL EXAMINATION: ECOG PERFORMANCE STATUS: 1 - Symptomatic but completely ambulatory  Vitals:   05/09/22 0851  BP: (!) 150/75  Pulse: 79  Resp: 18  Temp: 97.9 F (36.6 C)  SpO2: 97%   Filed Weights   05/09/22 0851  Weight: 196 lb 1.6 oz (89 kg)    GENERAL:alert, no distress and comfortable NEURO: alert & oriented x 3 with fluent speech, no focal motor/sensory deficits  LABORATORY DATA:  I have reviewed the data as listed    Component Value Date/Time   NA 145 05/09/2022 0815   NA 142 06/01/2017 0756   K 4.0 05/09/2022 0815   K 4.2 06/01/2017 0756   CL 111 05/09/2022 0815   CL 109 (H) 01/20/2013 0801   CO2 27 05/09/2022 0815   CO2 21 (L) 06/01/2017 0756   GLUCOSE 107 (H) 05/09/2022 0815   GLUCOSE 88 06/01/2017 0756   GLUCOSE 89 01/20/2013 0801   BUN 19 05/09/2022 0815   BUN 14.5 06/01/2017 0756   CREATININE 0.88 05/09/2022 0815   CREATININE 1.0 06/01/2017 0756   CALCIUM 9.8  05/09/2022 0815   CALCIUM 10.1 06/01/2017 0756   PROT 6.7 05/09/2022 0815   PROT 7.7 06/01/2017 0756   ALBUMIN 4.6 05/09/2022 0815   ALBUMIN 3.9 06/01/2017 0756   AST 37 05/09/2022 0815   AST 35 (H) 06/01/2017 0756   ALT 39 05/09/2022 0815   ALT 28 06/01/2017 0756   ALKPHOS 43 05/09/2022 0815   ALKPHOS 52 06/01/2017 0756   BILITOT 1.3 (H) 05/09/2022 0815   BILITOT 0.58 06/01/2017 0756   GFRNONAA >60 05/09/2022 0815   GFRAA 58 (L) 10/07/2018 1237   GFRAA 60 (L) 05/04/2018 0922    No results found for: "SPEP", "UPEP"  Lab Results  Component Value  Date   WBC 5.5 05/09/2022   NEUTROABS 3.0 05/09/2022   HGB 14.7 05/09/2022   HCT 44.2 05/09/2022   MCV 91.7 05/09/2022   PLT 157 05/09/2022      Chemistry      Component Value Date/Time   NA 145 05/09/2022 0815   NA 142 06/01/2017 0756   K 4.0 05/09/2022 0815   K 4.2 06/01/2017 0756   CL 111 05/09/2022 0815   CL 109 (H) 01/20/2013 0801   CO2 27 05/09/2022 0815   CO2 21 (L) 06/01/2017 0756   BUN 19 05/09/2022 0815   BUN 14.5 06/01/2017 0756   CREATININE 0.88 05/09/2022 0815   CREATININE 1.0 06/01/2017 0756   GLU 92 06/17/2013 0000      Component Value Date/Time   CALCIUM 9.8 05/09/2022 0815   CALCIUM 10.1 06/01/2017 0756   ALKPHOS 43 05/09/2022 0815   ALKPHOS 52 06/01/2017 0756   AST 37 05/09/2022 0815   AST 35 (H) 06/01/2017 0756   ALT 39 05/09/2022 0815   ALT 28 06/01/2017 0756   BILITOT 1.3 (H) 05/09/2022 0815   BILITOT 0.58 06/01/2017 0756

## 2022-05-11 LAB — IGG, IGA, IGM
IgA: <5 mg/dL — ABNORMAL LOW (ref 64–422)
IgG (Immunoglobin G), Serum: 46 mg/dL — ABNORMAL LOW (ref 586–1602)
IgM (Immunoglobulin M), Srm: <5 mg/dL — ABNORMAL LOW (ref 26–217)

## 2022-05-12 ENCOUNTER — Telehealth: Payer: Self-pay

## 2022-05-12 NOTE — Telephone Encounter (Signed)
Patient informed of recent lab results and current treatment plan. All questions answered during call.  Patient verbalized an understanding of the plan.

## 2022-05-12 NOTE — Telephone Encounter (Signed)
-----   Message from Heath Lark, MD sent at 05/12/2022  8:38 AM EDT ----- Pls let her know immunoglobulin levels are low as predicted, proceed with IVIG as scheduled

## 2022-05-20 ENCOUNTER — Inpatient Hospital Stay: Payer: Medicare Other

## 2022-05-20 VITALS — BP 154/74 | HR 87 | Temp 98.0°F | Resp 18 | Wt 194.2 lb

## 2022-05-20 DIAGNOSIS — C9110 Chronic lymphocytic leukemia of B-cell type not having achieved remission: Secondary | ICD-10-CM | POA: Diagnosis not present

## 2022-05-20 DIAGNOSIS — D801 Nonfamilial hypogammaglobulinemia: Secondary | ICD-10-CM

## 2022-05-20 MED ORDER — METHYLPREDNISOLONE SODIUM SUCC 40 MG IJ SOLR
40.0000 mg | Freq: Once | INTRAMUSCULAR | Status: AC
  Administered 2022-05-20: 40 mg via INTRAVENOUS
  Filled 2022-05-20: qty 1

## 2022-05-20 MED ORDER — LORATADINE 10 MG PO TABS
10.0000 mg | ORAL_TABLET | Freq: Every day | ORAL | Status: DC
  Administered 2022-05-20: 10 mg via ORAL
  Filled 2022-05-20: qty 1

## 2022-05-20 MED ORDER — IMMUNE GLOBULIN (HUMAN) 10 GM/100ML IV SOLN
1.0000 g/kg | Freq: Once | INTRAVENOUS | Status: AC
  Administered 2022-05-20: 40 g via INTRAVENOUS
  Filled 2022-05-20: qty 900

## 2022-05-20 MED ORDER — ACETAMINOPHEN 325 MG PO TABS
650.0000 mg | ORAL_TABLET | Freq: Once | ORAL | Status: AC
  Administered 2022-05-20: 650 mg via ORAL
  Filled 2022-05-20: qty 2

## 2022-05-20 MED ORDER — DEXTROSE 5 % IV SOLN: Freq: Once | INTRAVENOUS | Status: AC

## 2022-05-20 NOTE — Patient Instructions (Signed)

## 2022-06-17 ENCOUNTER — Other Ambulatory Visit: Payer: Self-pay

## 2022-06-17 ENCOUNTER — Inpatient Hospital Stay: Payer: Medicare Other | Attending: Hematology and Oncology

## 2022-06-17 VITALS — BP 163/89 | HR 92 | Temp 98.3°F | Resp 18 | Ht 63.0 in | Wt 189.8 lb

## 2022-06-17 DIAGNOSIS — D801 Nonfamilial hypogammaglobulinemia: Secondary | ICD-10-CM | POA: Insufficient documentation

## 2022-06-17 MED ORDER — LORATADINE 10 MG PO TABS: 10.0000 mg | ORAL_TABLET | Freq: Every day | ORAL | Status: DC

## 2022-06-17 MED ORDER — ACETAMINOPHEN 325 MG PO TABS
650.0000 mg | ORAL_TABLET | Freq: Once | ORAL | Status: AC
  Administered 2022-06-17: 650 mg via ORAL
  Filled 2022-06-17: qty 2

## 2022-06-17 MED ORDER — IMMUNE GLOBULIN (HUMAN) 10 GM/100ML IV SOLN
80.0000 g | Freq: Once | INTRAVENOUS | Status: AC
  Administered 2022-06-17: 80 g via INTRAVENOUS
  Filled 2022-06-17: qty 800

## 2022-06-17 MED ORDER — LORATADINE 10 MG PO TABS
10.0000 mg | ORAL_TABLET | Freq: Once | ORAL | Status: AC
  Administered 2022-06-17: 10 mg via ORAL
  Filled 2022-06-17: qty 1

## 2022-06-17 MED ORDER — METHYLPREDNISOLONE SODIUM SUCC 40 MG IJ SOLR
40.0000 mg | Freq: Once | INTRAMUSCULAR | Status: AC
  Administered 2022-06-17: 40 mg via INTRAVENOUS
  Filled 2022-06-17: qty 1

## 2022-06-17 NOTE — Patient Instructions (Signed)

## 2022-07-08 ENCOUNTER — Telehealth: Payer: Self-pay

## 2022-07-08 ENCOUNTER — Encounter: Payer: Self-pay | Admitting: Hematology and Oncology

## 2022-07-08 NOTE — Telephone Encounter (Signed)
She recently had labs with PCP and just got results. Platelets are 120. She is asking if you want to repeat lab at next appt on 11/21, she could come earlier for labs if needed.

## 2022-07-08 NOTE — Telephone Encounter (Signed)
Called and instructed to bring a copy of labs to office visit. She agreed.

## 2022-07-08 NOTE — Telephone Encounter (Signed)
Just bring the copy with her and cancel her lab appt

## 2022-07-15 ENCOUNTER — Encounter: Payer: Self-pay | Admitting: Hematology and Oncology

## 2022-07-15 ENCOUNTER — Inpatient Hospital Stay: Payer: Medicare Other | Attending: Hematology and Oncology | Admitting: Hematology and Oncology

## 2022-07-15 ENCOUNTER — Inpatient Hospital Stay: Payer: Medicare Other

## 2022-07-15 VITALS — BP 151/79 | HR 76 | Temp 98.8°F | Resp 16

## 2022-07-15 VITALS — BP 144/78 | HR 88 | Temp 98.7°F | Resp 18 | Ht 63.0 in | Wt 192.0 lb

## 2022-07-15 DIAGNOSIS — D696 Thrombocytopenia, unspecified: Secondary | ICD-10-CM | POA: Diagnosis not present

## 2022-07-15 DIAGNOSIS — D801 Nonfamilial hypogammaglobulinemia: Secondary | ICD-10-CM | POA: Diagnosis present

## 2022-07-15 DIAGNOSIS — Z856 Personal history of leukemia: Secondary | ICD-10-CM

## 2022-07-15 MED ORDER — IMMUNE GLOBULIN (HUMAN) 10 GM/100ML IV SOLN
1.0000 g/kg | Freq: Once | INTRAVENOUS | Status: AC
  Administered 2022-07-15: 85 g via INTRAVENOUS
  Filled 2022-07-15: qty 850

## 2022-07-15 MED ORDER — ACETAMINOPHEN 325 MG PO TABS
650.0000 mg | ORAL_TABLET | Freq: Once | ORAL | Status: AC
  Administered 2022-07-15: 650 mg via ORAL
  Filled 2022-07-15: qty 2

## 2022-07-15 MED ORDER — METHYLPREDNISOLONE SODIUM SUCC 40 MG IJ SOLR
40.0000 mg | Freq: Once | INTRAMUSCULAR | Status: AC
  Administered 2022-07-15: 40 mg via INTRAVENOUS
  Filled 2022-07-15: qty 1

## 2022-07-15 MED ORDER — LORATADINE 10 MG PO TABS
10.0000 mg | ORAL_TABLET | Freq: Every day | ORAL | Status: DC
  Administered 2022-07-15: 10 mg via ORAL
  Filled 2022-07-15: qty 1

## 2022-07-15 NOTE — Assessment & Plan Note (Signed)
The cause is unknown, could be due to consumption or treatment related It is mild and there is little change compared from previous platelet count.  She is asymptomatic from the thrombocytopenia. I will observe for now.

## 2022-07-15 NOTE — Progress Notes (Signed)
[name]  Patient Care Team: Tisovec, Fransico Him, MD as PCP - General (Internal Medicine) Deneise Lever, MD as Referring Physician (Pulmonary Disease) Rozetta Nunnery, MD (Inactive) as Referring Physician (Otolaryngology) Milus Banister, MD (Gastroenterology) Arvella Nigh, MD (Obstetrics and Gynecology) Heath Lark, MD as Consulting Physician (Hematology and Oncology) Jari Pigg, MD as Consulting Physician (Dermatology) Marygrace Drought, MD as Consulting Physician (Ophthalmology)  ASSESSMENT & PLAN:  Hypogammaglobulinemia, acquired [facility]) She has acquired hypogammaglobulinemia due to CLL and prior treatment This is a lifelong problem She believes she has benefited from IVIG infusion and did not suffer from recent recurrent infections The mild thrombocytopenia could be due to this I reassured her We will continue monthly infusion I will see her back in 4 months  Thrombocytopenia ([place]) The cause is unknown, could be due to consumption or treatment related It is mild and there is little change compared from previous platelet count.  She is asymptomatic from the thrombocytopenia. I will observe for now.   Orders Placed This Encounter  Procedures   CBC with Differential ([place] Only)    Standing Status:   Future    Standing Expiration Date:   07/16/2023    All questions were answered. The patient knows to call the clinic with any problems, questions or concerns. The total time spent in the appointment was 20 minutes encounter with patients including review of chart and various tests results, discussions about plan of care and coordination of care plan   Heath Lark, MD 07/15/2022 12:53 PM  INTERVAL HISTORY: Please see below for problem oriented charting. she returns for treatment follow-up on monthly IVIG infusion for acquired hypogammaglobulinemia from prior treatment for CLL She tolerated treatment well except for recent  intermittent headaches She denies recent infection  REVIEW OF SYSTEMS:   Constitutional: Denies fevers, chills or abnormal weight loss Eyes: Denies blurriness of vision Ears, nose, mouth, throat, and face: Denies mucositis or sore throat Respiratory: Denies cough, dyspnea or wheezes Cardiovascular: Denies palpitation, chest discomfort or lower extremity swelling Gastrointestinal:  Denies nausea, heartburn or change in bowel habits Skin: Denies abnormal skin rashes Lymphatics: Denies new lymphadenopathy or easy bruising Neurological:Denies numbness, tingling or new weaknesses Behavioral/Psych: Mood is stable, no new changes  All other systems were reviewed with the patient and are negative.  I have reviewed the past medical history, past surgical history, social history and family history with the patient and they are unchanged from previous note.  ALLERGIES:  is allergic to penicillins, pentamidine, and sulfonamide derivatives.  MEDICATIONS:  Current Outpatient Medications  Medication Sig Dispense Refill   acyclovir (ZOVIRAX) 400 MG tablet Take 1 tablet (400 mg total) by mouth 2 (two) times daily. 180 tablet 3   atorvastatin (LIPITOR) 10 MG tablet Take 10 mg by mouth daily.     Calcium Carbonate-Vitamin D (CALTRATE 600+D) 600-400 MG-UNIT per tablet Take 1 tablet by mouth daily.     famotidine (PEPCID) 10 MG tablet Take 10 mg by mouth daily.     fexofenadine (ALLEGRA) 180 MG tablet Take 180 mg by mouth daily.       levothyroxine (SYNTHROID, LEVOTHROID) 50 MCG tablet Take 50 mcg by mouth daily before breakfast.      Multiple Vitamin (MULTIVITAMIN) tablet Take 1 tablet by mouth daily.     mupirocin ointment (BACTROBAN) 2 % Apply 1 application topically as needed.      Current Facility-Administered Medications  Medication Dose Route Frequency Provider Last Rate Last  Admin   0.9 %  sodium chloride infusion  500 mL Intravenous Once Milus Banister, MD       Facility-Administered  Medications Ordered in Other Visits  Medication Dose Route Frequency Provider Last Rate Last Admin   diphenhydrAMINE (BENADRYL) injection 50 mg  50 mg Intramuscular Once PRN Alvy Bimler, Tangela Dolliver, MD       loratadine (CLARITIN) tablet 10 mg  10 mg Oral Daily Alvy Bimler, Ona Roehrs, MD   10 mg at 07/15/22 0177   methylPREDNISolone sodium succinate (SOLU-MEDROL) 40 mg/mL injection 20 mg  20 mg Intravenous Q30 days Heath Lark, MD   20 mg at 02/09/17 0920    SUMMARY OF ONCOLOGIC HISTORY: Oncology History   No history exists.    PHYSICAL EXAMINATION: ECOG PERFORMANCE STATUS: 0 - Asymptomatic  Vitals:   07/15/22 0807  BP: (!) 144/78  Pulse: 88  Resp: 18  Temp: 98.7 F (37.1 C)  SpO2: 99%   Filed Weights   07/15/22 0807  Weight: 192 lb (87.1 kg)    GENERAL:alert, no distress and comfortable NEURO: alert & oriented x 3 with fluent speech, no focal motor/sensory deficits  LABORATORY DATA:  I have reviewed the data as listed    Component Value Date/Time   NA 145 05/09/2022 0815   NA 142 06/01/2017 0756   K 4.0 05/09/2022 0815   K 4.2 06/01/2017 0756   CL 111 05/09/2022 0815   CL 109 (H) 01/20/2013 0801   CO2 27 05/09/2022 0815   CO2 21 (L) 06/01/2017 0756   GLUCOSE 107 (H) 05/09/2022 0815   GLUCOSE 88 06/01/2017 0756   GLUCOSE 89 01/20/2013 0801   BUN 19 05/09/2022 0815   BUN 14.5 06/01/2017 0756   CREATININE 0.88 05/09/2022 0815   CREATININE 1.0 06/01/2017 0756   CALCIUM 9.8 05/09/2022 0815   CALCIUM 10.1 06/01/2017 0756   PROT 6.7 05/09/2022 0815   PROT 7.7 06/01/2017 0756   ALBUMIN 4.6 05/09/2022 0815   ALBUMIN 3.9 06/01/2017 0756   AST 37 05/09/2022 0815   AST 35 (H) 06/01/2017 0756   ALT 39 05/09/2022 0815   ALT 28 06/01/2017 0756   ALKPHOS 43 05/09/2022 0815   ALKPHOS 52 06/01/2017 0756   BILITOT 1.3 (H) 05/09/2022 0815   BILITOT 0.58 06/01/2017 0756   GFRNONAA >60 05/09/2022 0815   GFRAA 58 (L) 10/07/2018 1237   GFRAA 60 (L) 05/04/2018 0922    No results found for:  "SPEP", "UPEP"  Lab Results  Component Value Date   WBC 5.5 05/09/2022   NEUTROABS 3.0 05/09/2022   HGB 14.7 05/09/2022   HCT 44.2 05/09/2022   MCV 91.7 05/09/2022   PLT 157 05/09/2022      Chemistry      Component Value Date/Time   NA 145 05/09/2022 0815   NA 142 06/01/2017 0756   K 4.0 05/09/2022 0815   K 4.2 06/01/2017 0756   CL 111 05/09/2022 0815   CL 109 (H) 01/20/2013 0801   CO2 27 05/09/2022 0815   CO2 21 (L) 06/01/2017 0756   BUN 19 05/09/2022 0815   BUN 14.5 06/01/2017 0756   CREATININE 0.88 05/09/2022 0815   CREATININE 1.0 06/01/2017 0756   GLU 92 06/17/2013 0000      Component Value Date/Time   CALCIUM 9.8 05/09/2022 0815   CALCIUM 10.1 06/01/2017 0756   ALKPHOS 43 05/09/2022 0815   ALKPHOS 52 06/01/2017 0756   AST 37 05/09/2022 0815   AST 35 (H) 06/01/2017 0756   ALT  39 05/09/2022 0815   ALT 28 06/01/2017 0756   BILITOT 1.3 (H) 05/09/2022 0815   BILITOT 0.58 06/01/2017 0756

## 2022-07-15 NOTE — Assessment & Plan Note (Signed)
She has acquired hypogammaglobulinemia due to CLL and prior treatment This is a lifelong problem She believes she has benefited from IVIG infusion and did not suffer from recent recurrent infections The mild thrombocytopenia could be due to this I reassured her We will continue monthly infusion I will see her back in 4 months

## 2022-07-15 NOTE — Patient Instructions (Signed)

## 2022-07-19 ENCOUNTER — Other Ambulatory Visit: Payer: Self-pay | Admitting: Hematology and Oncology

## 2022-08-12 ENCOUNTER — Inpatient Hospital Stay: Payer: Medicare Other | Attending: Hematology and Oncology

## 2022-08-12 VITALS — BP 154/83 | HR 78 | Temp 98.2°F | Resp 16 | Wt 191.5 lb

## 2022-08-12 DIAGNOSIS — C9110 Chronic lymphocytic leukemia of B-cell type not having achieved remission: Secondary | ICD-10-CM | POA: Diagnosis not present

## 2022-08-12 DIAGNOSIS — D801 Nonfamilial hypogammaglobulinemia: Secondary | ICD-10-CM | POA: Diagnosis present

## 2022-08-12 MED ORDER — ACETAMINOPHEN 325 MG PO TABS
650.0000 mg | ORAL_TABLET | Freq: Once | ORAL | Status: AC
  Administered 2022-08-12: 650 mg via ORAL
  Filled 2022-08-12: qty 2

## 2022-08-12 MED ORDER — LORATADINE 10 MG PO TABS
10.0000 mg | ORAL_TABLET | Freq: Every day | ORAL | Status: DC
  Administered 2022-08-12: 10 mg via ORAL
  Filled 2022-08-12: qty 1

## 2022-08-12 MED ORDER — METHYLPREDNISOLONE SODIUM SUCC 40 MG IJ SOLR
40.0000 mg | Freq: Once | INTRAMUSCULAR | Status: AC
  Administered 2022-08-12: 40 mg via INTRAVENOUS
  Filled 2022-08-12: qty 1

## 2022-08-12 MED ORDER — DEXTROSE 5 % IV SOLN: INTRAVENOUS | Status: DC

## 2022-08-12 MED ORDER — IMMUNE GLOBULIN (HUMAN) 10 GM/100ML IV SOLN
1.0000 g/kg | Freq: Once | INTRAVENOUS | Status: AC
  Administered 2022-08-12: 85 g via INTRAVENOUS
  Filled 2022-08-12: qty 850

## 2022-08-12 NOTE — Progress Notes (Signed)
Pt declined to be observed for 30 minutes post Privigen infusion. Pt tolerated trtmt well w/out incident. VSS at discharge.  Ambulatory to lobby.

## 2022-08-12 NOTE — Patient Instructions (Signed)

## 2022-09-08 ENCOUNTER — Inpatient Hospital Stay: Payer: Medicare Other | Attending: Hematology and Oncology

## 2022-09-08 VITALS — BP 165/83 | HR 68 | Temp 97.5°F | Resp 16 | Wt 191.4 lb

## 2022-09-08 DIAGNOSIS — C801 Malignant (primary) neoplasm, unspecified: Secondary | ICD-10-CM | POA: Insufficient documentation

## 2022-09-08 DIAGNOSIS — D801 Nonfamilial hypogammaglobulinemia: Secondary | ICD-10-CM | POA: Insufficient documentation

## 2022-09-08 MED ORDER — LORATADINE 10 MG PO TABS
10.0000 mg | ORAL_TABLET | Freq: Every day | ORAL | Status: DC
  Administered 2022-09-08: 10 mg via ORAL
  Filled 2022-09-08: qty 1

## 2022-09-08 MED ORDER — IMMUNE GLOBULIN (HUMAN) 10 GM/100ML IV SOLN
80.0000 g | Freq: Once | INTRAVENOUS | Status: AC
  Administered 2022-09-08: 80 g via INTRAVENOUS
  Filled 2022-09-08: qty 800

## 2022-09-08 MED ORDER — METHYLPREDNISOLONE SODIUM SUCC 40 MG IJ SOLR
40.0000 mg | Freq: Once | INTRAMUSCULAR | Status: AC
  Administered 2022-09-08: 40 mg via INTRAVENOUS
  Filled 2022-09-08: qty 1

## 2022-09-08 MED ORDER — ACETAMINOPHEN 325 MG PO TABS
650.0000 mg | ORAL_TABLET | Freq: Once | ORAL | Status: AC
  Administered 2022-09-08: 650 mg via ORAL
  Filled 2022-09-08: qty 2

## 2022-09-08 MED ORDER — DEXTROSE 5 % IV SOLN: Freq: Once | INTRAVENOUS | Status: AC

## 2022-09-08 NOTE — Patient Instructions (Signed)

## 2022-09-09 ENCOUNTER — Inpatient Hospital Stay: Payer: Medicare Other

## 2022-10-07 ENCOUNTER — Inpatient Hospital Stay: Payer: Medicare Other | Attending: Hematology and Oncology

## 2022-10-07 VITALS — BP 158/68 | HR 86 | Temp 98.6°F | Resp 16

## 2022-10-07 DIAGNOSIS — D801 Nonfamilial hypogammaglobulinemia: Secondary | ICD-10-CM | POA: Insufficient documentation

## 2022-10-07 MED ORDER — METHYLPREDNISOLONE SODIUM SUCC 40 MG IJ SOLR
40.0000 mg | Freq: Once | INTRAMUSCULAR | Status: AC
  Administered 2022-10-07: 40 mg via INTRAVENOUS
  Filled 2022-10-07: qty 1

## 2022-10-07 MED ORDER — ACETAMINOPHEN 325 MG PO TABS
650.0000 mg | ORAL_TABLET | Freq: Once | ORAL | Status: AC
  Administered 2022-10-07: 650 mg via ORAL
  Filled 2022-10-07: qty 2

## 2022-10-07 MED ORDER — LORATADINE 10 MG PO TABS
10.0000 mg | ORAL_TABLET | Freq: Once | ORAL | Status: AC
  Administered 2022-10-07: 10 mg via ORAL
  Filled 2022-10-07: qty 1

## 2022-10-07 MED ORDER — IMMUNE GLOBULIN (HUMAN) 10 GM/100ML IV SOLN
85.0000 g | Freq: Once | INTRAVENOUS | Status: AC
  Administered 2022-10-07: 85 g via INTRAVENOUS
  Filled 2022-10-07: qty 850

## 2022-10-07 NOTE — Patient Instructions (Signed)

## 2022-11-03 ENCOUNTER — Inpatient Hospital Stay: Payer: Medicare Other | Attending: Hematology and Oncology

## 2022-11-03 ENCOUNTER — Encounter: Payer: Self-pay | Admitting: Hematology and Oncology

## 2022-11-03 ENCOUNTER — Inpatient Hospital Stay: Payer: Medicare Other

## 2022-11-03 ENCOUNTER — Inpatient Hospital Stay (HOSPITAL_BASED_OUTPATIENT_CLINIC_OR_DEPARTMENT_OTHER): Payer: Medicare Other | Admitting: Hematology and Oncology

## 2022-11-03 VITALS — BP 153/80 | HR 73 | Temp 97.8°F | Resp 15

## 2022-11-03 VITALS — BP 151/86 | HR 78 | Temp 97.7°F | Resp 18 | Ht 63.0 in | Wt 196.6 lb

## 2022-11-03 DIAGNOSIS — D801 Nonfamilial hypogammaglobulinemia: Secondary | ICD-10-CM | POA: Diagnosis not present

## 2022-11-03 DIAGNOSIS — Z856 Personal history of leukemia: Secondary | ICD-10-CM

## 2022-11-03 LAB — CBC WITH DIFFERENTIAL (CANCER CENTER ONLY)
Abs Immature Granulocytes: 0.01 10*3/uL (ref 0.00–0.07)
Basophils Absolute: 0 10*3/uL (ref 0.0–0.1)
Basophils Relative: 0 %
Eosinophils Absolute: 0.1 10*3/uL (ref 0.0–0.5)
Eosinophils Relative: 2 %
HCT: 43.8 % (ref 36.0–46.0)
Hemoglobin: 14.8 g/dL (ref 12.0–15.0)
Immature Granulocytes: 0 %
Lymphocytes Relative: 49 %
Lymphs Abs: 2.4 10*3/uL (ref 0.7–4.0)
MCH: 30 pg (ref 26.0–34.0)
MCHC: 33.8 g/dL (ref 30.0–36.0)
MCV: 88.8 fL (ref 80.0–100.0)
Monocytes Absolute: 0.4 10*3/uL (ref 0.1–1.0)
Monocytes Relative: 8 %
Neutro Abs: 2 10*3/uL (ref 1.7–7.7)
Neutrophils Relative %: 41 %
Platelet Count: 144 10*3/uL — ABNORMAL LOW (ref 150–400)
RBC: 4.93 MIL/uL (ref 3.87–5.11)
RDW: 13.3 % (ref 11.5–15.5)
WBC Count: 4.8 10*3/uL (ref 4.0–10.5)
nRBC: 0 % (ref 0.0–0.2)

## 2022-11-03 MED ORDER — IMMUNE GLOBULIN (HUMAN) 10 GM/100ML IV SOLN
1.0000 g/kg | Freq: Once | INTRAVENOUS | Status: AC
  Administered 2022-11-03: 90 g via INTRAVENOUS
  Filled 2022-11-03: qty 900

## 2022-11-03 MED ORDER — DEXTROSE 5 % IV SOLN: INTRAVENOUS | Status: DC

## 2022-11-03 MED ORDER — ACETAMINOPHEN 325 MG PO TABS
650.0000 mg | ORAL_TABLET | Freq: Once | ORAL | Status: AC
  Administered 2022-11-03: 650 mg via ORAL
  Filled 2022-11-03: qty 2

## 2022-11-03 MED ORDER — METHYLPREDNISOLONE SODIUM SUCC 40 MG IJ SOLR
40.0000 mg | Freq: Once | INTRAMUSCULAR | Status: AC
  Administered 2022-11-03: 40 mg via INTRAVENOUS
  Filled 2022-11-03: qty 1

## 2022-11-03 MED ORDER — LORATADINE 10 MG PO TABS
10.0000 mg | ORAL_TABLET | Freq: Once | ORAL | Status: AC
  Administered 2022-11-03: 10 mg via ORAL
  Filled 2022-11-03: qty 1

## 2022-11-03 NOTE — Patient Instructions (Signed)

## 2022-11-03 NOTE — Progress Notes (Signed)
Per patient preference, IVIG rate stopped at 240 mg/kg/hr.  Patient reports getting serum sickness with higher rates.  Patient declined 30 min post obs.  VSS at discharge.  Patient tolerated treatment well without incident.

## 2022-11-03 NOTE — Assessment & Plan Note (Signed)
She has acquired hypogammaglobulinemia due to CLL and prior treatment This is a lifelong problem She believes she has benefited from IVIG infusion and did not suffer from recent recurrent infections The mild thrombocytopenia could be due to this I reassured her We will continue monthly infusion I will see her back in 4 months 

## 2022-11-03 NOTE — Progress Notes (Signed)
[place] OFFICE PROGRESS NOTE  Patient Care Team: Tisovec, Fransico Him, MD as PCP - General (Internal Medicine) Deneise Lever, MD as Referring Physician (Pulmonary Disease) Rozetta Nunnery, MD (Inactive) as Referring Physician (Otolaryngology) Milus Banister, MD (Gastroenterology) Arvella Nigh, MD (Obstetrics and Gynecology) Heath Lark, MD as Consulting Physician (Hematology and Oncology) Jari Pigg, MD as Consulting Physician (Dermatology) Marygrace Drought, MD as Consulting Physician (Ophthalmology)  ASSESSMENT & PLAN:  Hypogammaglobulinemia, acquired [facility]) She has acquired hypogammaglobulinemia due to CLL and prior treatment This is a lifelong problem She believes she has benefited from IVIG infusion and did not suffer from recent recurrent infections The mild thrombocytopenia could be due to this I reassured her We will continue monthly infusion I will see her back in 4 months  Personal history of CLL (chronic lymphocytic leukemia) She has no signs or symptoms of CLL recurrence We will continue close monitoring She does not need routine surveillance imaging  Orders Placed This Encounter  Procedures   CBC with Differential/Platelet    Standing Status:   Standing    Number of Occurrences:   22    Standing Expiration Date:   11/03/2023   IgG, IgA, IgM    Standing Status:   Future    Standing Expiration Date:   11/03/2023    All questions were answered. The patient knows to call the clinic with any problems, questions or concerns. The total time spent in the appointment was 20 minutes encounter with patients including review of chart and various tests results, discussions about plan of care and coordination of care plan   Heath Lark, MD 11/03/2022 9:51 AM  INTERVAL HISTORY: Please see below for problem oriented charting. she returns for treatment follow-up on monthly IVIG treatment She is doing well She has no recent severe infection since she  started monthly IVIG treatment She has mild intermittent high blood pressure whenever she gets IVIG but otherwise no reactions  REVIEW OF SYSTEMS:   Constitutional: Denies fevers, chills or abnormal weight loss Eyes: Denies blurriness of vision Ears, nose, mouth, throat, and face: Denies mucositis or sore throat Respiratory: Denies cough, dyspnea or wheezes Cardiovascular: Denies palpitation, chest discomfort or lower extremity swelling Gastrointestinal:  Denies nausea, heartburn or change in bowel habits Skin: Denies abnormal skin rashes Lymphatics: Denies new lymphadenopathy or easy bruising Neurological:Denies numbness, tingling or new weaknesses Behavioral/Psych: Mood is stable, no new changes  All other systems were reviewed with the patient and are negative.  I have reviewed the past medical history, past surgical history, social history and family history with the patient and they are unchanged from previous note.  ALLERGIES:  is allergic to penicillins, pentamidine, and sulfonamide derivatives.  MEDICATIONS:  Current Outpatient Medications  Medication Sig Dispense Refill   acyclovir (ZOVIRAX) 400 MG tablet TAKE 1 TABLET TWICE A DAY 180 tablet 3   atorvastatin (LIPITOR) 10 MG tablet Take 10 mg by mouth daily.     Calcium Carbonate-Vitamin D (CALTRATE 600+D) 600-400 MG-UNIT per tablet Take 1 tablet by mouth daily.     famotidine (PEPCID) 10 MG tablet Take 10 mg by mouth daily.     fexofenadine (ALLEGRA) 180 MG tablet Take 180 mg by mouth daily.       levothyroxine (SYNTHROID, LEVOTHROID) 50 MCG tablet Take 50 mcg by mouth daily before breakfast.      Multiple Vitamin (MULTIVITAMIN) tablet Take 1 tablet by mouth daily.     mupirocin ointment (BACTROBAN) 2 % Apply 1 application  topically as needed.      Current Facility-Administered Medications  Medication Dose Route Frequency Provider Last Rate Last Admin   0.9 %  sodium chloride infusion  500 mL Intravenous Once Milus Banister, MD       Facility-Administered Medications Ordered in Other Visits  Medication Dose Route Frequency Provider Last Rate Last Admin   dextrose 5 % solution   Intravenous Continuous Heath Lark, MD 20 mL/hr at 11/03/22 0911 New Bag at 11/03/22 0911   diphenhydrAMINE (BENADRYL) injection 50 mg  50 mg Intramuscular Once PRN Heath Lark, MD       methylPREDNISolone sodium succinate (SOLU-MEDROL) 40 mg/mL injection 20 mg  20 mg Intravenous Q30 days Heath Lark, MD   20 mg at 02/09/17 0920    SUMMARY OF ONCOLOGIC HISTORY:  She was in one of the first cohorts of patients treated with FCR on protocol at the [facility] at time of her initial diagnosis in July 2002. Technically she had RAI  stage III disease due to anemia but she had no organomegaly or lymphadenopathy on exam. FCR was started in 12/2001, and continued through 06/2002. She achieved a complete hematologic response and,which has been durable now for over 11 years!  She did develop hypogammaglobulinemia. As a result, she gets recurrent bronchitis and sinusitis. In April 2013 she was started her on monthly intravenous immunoglobulin 400 mg per kilogram and this has significantly impacted on the frequency and severity of her infections. This was continued through April of 2014. She has had no new infections since stopping the IVIG but as expected, immunoglobulin levels have fallen to pretreatment levels.   She has a history of her recurrent squamous cell carcinomas of her skin and has had 2 lesions excised from her left tibial area and another 1 excised from the vulvar area. Recently, she had a basal cell carcinoma removed. She has a history of both herpes zoster and herpes simplex infections. Recently, she had abnormal Pap smear and currently on close observation. Recent colposcopy was negative.  She received IVIG treatment for acquired hypogammaglobulinemia but stopped in April 2014 due to sick serum syndrome In July 2016, IVIG  is resumed with premedication In August 2016, she developed cellulitis from venipuncture, resolved with antibiotics From 2016 to 2024, she continues to have recurrent infection requiring antibiotic therapy.  She is also receiving IVIG treatment that helps reduce the risk of severe infection.  PHYSICAL EXAMINATION: ECOG PERFORMANCE STATUS: 0 - Asymptomatic  Vitals:   11/03/22 0823  BP: (!) 151/86  Pulse: 78  Resp: 18  Temp: 97.7 F (36.5 C)  SpO2: 98%   Filed Weights   11/03/22 0823  Weight: 196 lb 9.6 oz (89.2 kg)    GENERAL:alert, no distress and comfortable  NEURO: alert & oriented x 3 with fluent speech, no focal motor/sensory deficits  LABORATORY DATA:  I have reviewed the data as listed    Component Value Date/Time   NA 145 05/09/2022 0815   NA 142 06/01/2017 0756   K 4.0 05/09/2022 0815   K 4.2 06/01/2017 0756   CL 111 05/09/2022 0815   CL 109 (H) 01/20/2013 0801   CO2 27 05/09/2022 0815   CO2 21 (L) 06/01/2017 0756   GLUCOSE 107 (H) 05/09/2022 0815   GLUCOSE 88 06/01/2017 0756   GLUCOSE 89 01/20/2013 0801   BUN 19 05/09/2022 0815   BUN 14.5 06/01/2017 0756   CREATININE 0.88 05/09/2022 0815   CREATININE 1.0 06/01/2017 0756  CALCIUM 9.8 05/09/2022 0815   CALCIUM 10.1 06/01/2017 0756   PROT 6.7 05/09/2022 0815   PROT 7.7 06/01/2017 0756   ALBUMIN 4.6 05/09/2022 0815   ALBUMIN 3.9 06/01/2017 0756   AST 37 05/09/2022 0815   AST 35 (H) 06/01/2017 0756   ALT 39 05/09/2022 0815   ALT 28 06/01/2017 0756   ALKPHOS 43 05/09/2022 0815   ALKPHOS 52 06/01/2017 0756   BILITOT 1.3 (H) 05/09/2022 0815   BILITOT 0.58 06/01/2017 0756   GFRNONAA >60 05/09/2022 0815   GFRAA 58 (L) 10/07/2018 1237   GFRAA 60 (L) 05/04/2018 0922    No results found for: "SPEP", "UPEP"  Lab Results  Component Value Date   WBC 4.8 11/03/2022   NEUTROABS 2.0 11/03/2022   HGB 14.8 11/03/2022   HCT 43.8 11/03/2022   MCV 88.8 11/03/2022   PLT 144 (L) 11/03/2022      Chemistry       Component Value Date/Time   NA 145 05/09/2022 0815   NA 142 06/01/2017 0756   K 4.0 05/09/2022 0815   K 4.2 06/01/2017 0756   CL 111 05/09/2022 0815   CL 109 (H) 01/20/2013 0801   CO2 27 05/09/2022 0815   CO2 21 (L) 06/01/2017 0756   BUN 19 05/09/2022 0815   BUN 14.5 06/01/2017 0756   CREATININE 0.88 05/09/2022 0815   CREATININE 1.0 06/01/2017 0756   GLU 92 06/17/2013 0000      Component Value Date/Time   CALCIUM 9.8 05/09/2022 0815   CALCIUM 10.1 06/01/2017 0756   ALKPHOS 43 05/09/2022 0815   ALKPHOS 52 06/01/2017 0756   AST 37 05/09/2022 0815   AST 35 (H) 06/01/2017 0756   ALT 39 05/09/2022 0815   ALT 28 06/01/2017 0756   BILITOT 1.3 (H) 05/09/2022 0815   BILITOT 0.58 06/01/2017 0756

## 2022-11-03 NOTE — Assessment & Plan Note (Signed)
She has no signs or symptoms of CLL recurrence We will continue close monitoring She does not need routine surveillance imaging 

## 2022-11-07 ENCOUNTER — Other Ambulatory Visit: Payer: Self-pay | Admitting: Internal Medicine

## 2022-11-07 ENCOUNTER — Encounter: Payer: Self-pay | Admitting: Physician Assistant

## 2022-11-07 DIAGNOSIS — Z1231 Encounter for screening mammogram for malignant neoplasm of breast: Secondary | ICD-10-CM

## 2022-11-25 ENCOUNTER — Telehealth: Payer: Self-pay | Admitting: Hematology and Oncology

## 2022-11-25 NOTE — Telephone Encounter (Signed)
Patient called to change 4/8 appointment to 4/9. Patient rescheduled and notified.

## 2022-12-01 ENCOUNTER — Inpatient Hospital Stay: Payer: Medicare Other

## 2022-12-02 ENCOUNTER — Inpatient Hospital Stay: Payer: Medicare Other | Attending: Hematology and Oncology

## 2022-12-02 VITALS — BP 150/69 | HR 88 | Temp 98.3°F | Resp 17

## 2022-12-02 DIAGNOSIS — D801 Nonfamilial hypogammaglobulinemia: Secondary | ICD-10-CM | POA: Diagnosis present

## 2022-12-02 MED ORDER — DEXTROSE 5 % IV SOLN: INTRAVENOUS | Status: DC

## 2022-12-02 MED ORDER — ACETAMINOPHEN 325 MG PO TABS
650.0000 mg | ORAL_TABLET | Freq: Once | ORAL | Status: AC
  Administered 2022-12-02: 650 mg via ORAL
  Filled 2022-12-02: qty 2

## 2022-12-02 MED ORDER — LORATADINE 10 MG PO TABS
10.0000 mg | ORAL_TABLET | Freq: Once | ORAL | Status: AC
  Administered 2022-12-02: 10 mg via ORAL
  Filled 2022-12-02: qty 1

## 2022-12-02 MED ORDER — IMMUNE GLOBULIN (HUMAN) 10 GM/100ML IV SOLN
90.0000 g | Freq: Once | INTRAVENOUS | Status: AC
  Administered 2022-12-02: 90 g via INTRAVENOUS
  Filled 2022-12-02: qty 800

## 2022-12-02 MED ORDER — METHYLPREDNISOLONE SODIUM SUCC 40 MG IJ SOLR
40.0000 mg | Freq: Once | INTRAMUSCULAR | Status: AC
  Administered 2022-12-02: 40 mg via INTRAVENOUS
  Filled 2022-12-02: qty 1

## 2022-12-02 NOTE — Progress Notes (Signed)
Patient declined to remain for 30 min post obs. VSS and ambulated to the lobby.

## 2022-12-29 ENCOUNTER — Inpatient Hospital Stay: Payer: Medicare Other

## 2022-12-29 ENCOUNTER — Inpatient Hospital Stay: Payer: Medicare Other | Attending: Hematology and Oncology

## 2022-12-29 VITALS — BP 164/87 | HR 89 | Temp 98.1°F | Resp 16

## 2022-12-29 DIAGNOSIS — D801 Nonfamilial hypogammaglobulinemia: Secondary | ICD-10-CM | POA: Insufficient documentation

## 2022-12-29 MED ORDER — ACETAMINOPHEN 325 MG PO TABS
650.0000 mg | ORAL_TABLET | Freq: Once | ORAL | Status: AC
  Administered 2022-12-29: 650 mg via ORAL
  Filled 2022-12-29: qty 2

## 2022-12-29 MED ORDER — DEXTROSE 5 % IV SOLN: INTRAVENOUS | Status: DC

## 2022-12-29 MED ORDER — IMMUNE GLOBULIN (HUMAN) 10 GM/100ML IV SOLN
90.0000 g | Freq: Once | INTRAVENOUS | Status: AC
  Administered 2022-12-29: 90 g via INTRAVENOUS
  Filled 2022-12-29: qty 900

## 2022-12-29 MED ORDER — METHYLPREDNISOLONE SODIUM SUCC 40 MG IJ SOLR
40.0000 mg | Freq: Once | INTRAMUSCULAR | Status: AC
  Administered 2022-12-29: 40 mg via INTRAVENOUS
  Filled 2022-12-29: qty 1

## 2022-12-29 MED ORDER — LORATADINE 10 MG PO TABS
10.0000 mg | ORAL_TABLET | Freq: Every day | ORAL | Status: DC
  Administered 2022-12-29: 10 mg via ORAL
  Filled 2022-12-29: qty 1

## 2023-01-05 ENCOUNTER — Ambulatory Visit
Admission: RE | Admit: 2023-01-05 | Discharge: 2023-01-05 | Disposition: A | Discharge status: Home / Self Care | Payer: Medicare Other | Source: Ambulatory Visit | Attending: Internal Medicine | Admitting: Internal Medicine

## 2023-01-05 DIAGNOSIS — Z1231 Encounter for screening mammogram for malignant neoplasm of breast: Secondary | ICD-10-CM

## 2023-01-26 ENCOUNTER — Inpatient Hospital Stay: Payer: Medicare Other | Attending: Hematology and Oncology

## 2023-01-26 ENCOUNTER — Inpatient Hospital Stay: Payer: Medicare Other

## 2023-01-26 ENCOUNTER — Other Ambulatory Visit: Payer: Self-pay

## 2023-01-26 VITALS — BP 160/89 | HR 72 | Temp 98.4°F | Resp 16 | Wt 200.5 lb

## 2023-01-26 DIAGNOSIS — D801 Nonfamilial hypogammaglobulinemia: Secondary | ICD-10-CM | POA: Insufficient documentation

## 2023-01-26 MED ORDER — DEXTROSE 5 % IV SOLN: INTRAVENOUS | Status: DC

## 2023-01-26 MED ORDER — METHYLPREDNISOLONE SODIUM SUCC 40 MG IJ SOLR
40.0000 mg | Freq: Once | INTRAMUSCULAR | Status: AC
  Administered 2023-01-26: 40 mg via INTRAVENOUS
  Filled 2023-01-26: qty 1

## 2023-01-26 MED ORDER — ACETAMINOPHEN 325 MG PO TABS
650.0000 mg | ORAL_TABLET | Freq: Once | ORAL | Status: AC
  Administered 2023-01-26: 650 mg via ORAL
  Filled 2023-01-26: qty 2

## 2023-01-26 MED ORDER — IMMUNE GLOBULIN (HUMAN) 10 GM/100ML IV SOLN
90.0000 g | Freq: Once | INTRAVENOUS | Status: AC
  Administered 2023-01-26: 90 g via INTRAVENOUS
  Filled 2023-01-26: qty 900

## 2023-01-26 MED ORDER — LORATADINE 10 MG PO TABS
10.0000 mg | ORAL_TABLET | Freq: Once | ORAL | Status: AC
  Administered 2023-01-26: 10 mg via ORAL
  Filled 2023-01-26: qty 1

## 2023-01-26 NOTE — Progress Notes (Signed)
Patient declined to stay for 30 minute post IVIG infusion observation. No hx previous adverse reaction. VS obtained at time of discharge. Declined AVS. Pt ambulated independently to lobby for discharge.

## 2023-01-26 NOTE — Patient Instructions (Signed)

## 2023-02-13 ENCOUNTER — Telehealth: Payer: Self-pay

## 2023-02-13 ENCOUNTER — Encounter: Payer: Self-pay | Admitting: Hematology and Oncology

## 2023-02-13 ENCOUNTER — Other Ambulatory Visit: Payer: Self-pay | Admitting: Hematology and Oncology

## 2023-02-13 ENCOUNTER — Other Ambulatory Visit (HOSPITAL_COMMUNITY): Payer: Self-pay

## 2023-02-13 MED ORDER — NIRMATRELVIR/RITONAVIR (PAXLOVID)TABLET
3.0000 | ORAL_TABLET | Freq: Two times a day (BID) | ORAL | 0 refills | Status: AC
  Filled 2023-02-13 (×2): qty 30, 5d supply, fill #0

## 2023-02-13 NOTE — Telephone Encounter (Signed)
Called and told her Paxlovid Rx sent to [facility]. She will start today. She will take OTC meds as needed for symptoms. No symptoms at this time. She will call the office back for questions/ concerns.  Told her I would check with charge nurse and let her know about 7/2 appt. She verbalized understanding and appreciated the call.

## 2023-02-13 NOTE — Telephone Encounter (Signed)
If she is negative she does not need anything

## 2023-02-13 NOTE — Telephone Encounter (Signed)
I sent Paxlovid to [facility] because most pharmacies do not stock it Please ask charge nurse to see if we can give her IV IG in a private room instead of reschedule

## 2023-02-13 NOTE — Telephone Encounter (Signed)
She called and left a long message that she went on vacation with 6 other people. 3 people have tested positive for covid, including her husband. They are wearing masks around each other and keeping a good distance from each other. Her husband did a virtual visit yesterday with PCP and got Paxlovid Rx. She tested negative for covid on home test.  She is concerned and asking what Dr. Bertis Ruddy recommends.

## 2023-02-13 NOTE — Telephone Encounter (Signed)
She called back and left a message. She retested for Covid and it now covid positive.  She is requesting Paxlovid Rx to pharmacy please.  She is scheduled on 7/2 for appts at [facility]. Move appts out 21 days from today?

## 2023-02-13 NOTE — Telephone Encounter (Signed)
Called and given below message. She verbalized understanding and appreciated the call. She will continue to monitor her symptoms and call the office back if she tests positive.

## 2023-02-16 ENCOUNTER — Encounter: Payer: Self-pay | Admitting: Hematology and Oncology

## 2023-02-20 ENCOUNTER — Telehealth: Payer: Self-pay

## 2023-02-20 NOTE — Telephone Encounter (Signed)
Returned her call. Given instructions on 7/2 appts due to being covid positive. Instructed to call from outside before coming into the cancer center, given infusion charge nurse # to call when she arrives. Reminded to wear mask and all the appts will be in the beds in the infusion room. She verbalized understanding.

## 2023-02-24 ENCOUNTER — Inpatient Hospital Stay: Payer: Medicare Other

## 2023-02-24 ENCOUNTER — Inpatient Hospital Stay: Payer: Medicare Other | Attending: Hematology and Oncology

## 2023-02-24 ENCOUNTER — Inpatient Hospital Stay (HOSPITAL_BASED_OUTPATIENT_CLINIC_OR_DEPARTMENT_OTHER): Payer: Medicare Other | Admitting: Hematology and Oncology

## 2023-02-24 ENCOUNTER — Encounter: Payer: Self-pay | Admitting: Hematology and Oncology

## 2023-02-24 ENCOUNTER — Other Ambulatory Visit: Payer: Self-pay

## 2023-02-24 VITALS — BP 151/88 | HR 77 | Temp 98.7°F | Resp 18

## 2023-02-24 DIAGNOSIS — Z856 Personal history of leukemia: Secondary | ICD-10-CM

## 2023-02-24 DIAGNOSIS — D801 Nonfamilial hypogammaglobulinemia: Secondary | ICD-10-CM

## 2023-02-24 LAB — CBC WITH DIFFERENTIAL/PLATELET
Abs Immature Granulocytes: 0.01 10*3/uL (ref 0.00–0.07)
Basophils Absolute: 0 10*3/uL (ref 0.0–0.1)
Basophils Relative: 0 %
Eosinophils Absolute: 0.1 10*3/uL (ref 0.0–0.5)
Eosinophils Relative: 1 %
HCT: 44.8 % (ref 36.0–46.0)
Hemoglobin: 15.2 g/dL — ABNORMAL HIGH (ref 12.0–15.0)
Immature Granulocytes: 0 %
Lymphocytes Relative: 29 %
Lymphs Abs: 1.8 10*3/uL (ref 0.7–4.0)
MCH: 29.9 pg (ref 26.0–34.0)
MCHC: 33.9 g/dL (ref 30.0–36.0)
MCV: 88.2 fL (ref 80.0–100.0)
Monocytes Absolute: 0.6 10*3/uL (ref 0.1–1.0)
Monocytes Relative: 9 %
Neutro Abs: 3.9 10*3/uL (ref 1.7–7.7)
Neutrophils Relative %: 61 %
Platelets: 116 10*3/uL — ABNORMAL LOW (ref 150–400)
RBC: 5.08 MIL/uL (ref 3.87–5.11)
RDW: 13.4 % (ref 11.5–15.5)
WBC: 6.4 10*3/uL (ref 4.0–10.5)
nRBC: 0 % (ref 0.0–0.2)

## 2023-02-24 MED ORDER — ACETAMINOPHEN 325 MG PO TABS
650.0000 mg | ORAL_TABLET | Freq: Once | ORAL | Status: AC
  Administered 2023-02-24: 650 mg via ORAL
  Filled 2023-02-24: qty 2

## 2023-02-24 MED ORDER — IMMUNE GLOBULIN (HUMAN) 10 GM/100ML IV SOLN
90.0000 g | Freq: Once | INTRAVENOUS | Status: AC
  Administered 2023-02-24: 90 g via INTRAVENOUS
  Filled 2023-02-24: qty 900

## 2023-02-24 MED ORDER — METHYLPREDNISOLONE SODIUM SUCC 40 MG IJ SOLR
40.0000 mg | Freq: Once | INTRAMUSCULAR | Status: AC
  Administered 2023-02-24: 40 mg via INTRAVENOUS
  Filled 2023-02-24: qty 1

## 2023-02-24 MED ORDER — LORATADINE 10 MG PO TABS
10.0000 mg | ORAL_TABLET | Freq: Once | ORAL | Status: AC
  Administered 2023-02-24: 10 mg via ORAL
  Filled 2023-02-24: qty 1

## 2023-02-24 NOTE — Progress Notes (Signed)
[facility] OFFICE PROGRESS NOTE  Patient Care Team: Tisovec, Adelfa Koh, MD as PCP - General (Internal Medicine) Waymon Budge, MD as Referring Physician (Pulmonary Disease) Drema Halon, MD (Inactive) as Referring Physician (Otolaryngology) Rachael Fee, MD (Gastroenterology) Richardean Chimera, MD (Obstetrics and Gynecology) Artis Delay, MD as Consulting Physician (Hematology and Oncology) Elmon Else, MD as Consulting Physician (Dermatology) Janet Berlin, MD as Consulting Physician (Ophthalmology)  ASSESSMENT & PLAN:  Personal history of CLL (chronic lymphocytic leukemia) She has no signs or symptoms of CLL recurrence We will continue close monitoring She does not need routine surveillance imaging  Hypogammaglobulinemia, acquired [facility]) She has acquired hypogammaglobulinemia due to CLL and prior treatment This is a lifelong problem She believes she has benefited from IVIG infusion and did not suffer from recent recurrent infections Despite our best effort, the patient contracted COVID infection recently She is recovering well  No orders of the defined types were placed in this encounter.   All questions were answered. The patient knows to call the clinic with any problems, questions or concerns. The total time spent in the appointment was 20 minutes encounter with patients including review of chart and various tests results, discussions about plan of care and coordination of care plan   Artis Delay, MD 02/24/2023 10:33 AM  INTERVAL HISTORY: Please see below for problem oriented charting. she returns for treatment follow-up She is seen in the infusion room The patient recently contracted COVID infection but is recovering well from that She had recent sinus congestion and cough but that has improved  REVIEW OF SYSTEMS:   Constitutional: Denies fevers, chills or abnormal weight loss Eyes: Denies blurriness of vision Ears, nose, mouth, throat, and  face: Denies mucositis or sore throat Respiratory: Denies cough, dyspnea or wheezes Cardiovascular: Denies palpitation, chest discomfort or lower extremity swelling Gastrointestinal:  Denies nausea, heartburn or change in bowel habits Skin: Denies abnormal skin rashes Lymphatics: Denies new lymphadenopathy or easy bruising Neurological:Denies numbness, tingling or new weaknesses Behavioral/Psych: Mood is stable, no new changes  All other systems were reviewed with the patient and are negative.  I have reviewed the past medical history, past surgical history, social history and family history with the patient and they are unchanged from previous note.  ALLERGIES:  is allergic to penicillins, pentamidine, and sulfonamide derivatives.  MEDICATIONS:  Current Outpatient Medications  Medication Sig Dispense Refill   acyclovir (ZOVIRAX) 400 MG tablet TAKE 1 TABLET TWICE A DAY 180 tablet 3   atorvastatin (LIPITOR) 10 MG tablet Take 10 mg by mouth daily.     Calcium Carbonate-Vitamin D (CALTRATE 600+D) 600-400 MG-UNIT per tablet Take 1 tablet by mouth daily.     famotidine (PEPCID) 10 MG tablet Take 10 mg by mouth daily.     fexofenadine (ALLEGRA) 180 MG tablet Take 180 mg by mouth daily.       levothyroxine (SYNTHROID, LEVOTHROID) 50 MCG tablet Take 50 mcg by mouth daily before breakfast.      mupirocin ointment (BACTROBAN) 2 % Apply 1 application topically as needed.      Current Facility-Administered Medications  Medication Dose Route Frequency Provider Last Rate Last Admin   0.9 %  sodium chloride infusion  500 mL Intravenous Once Rachael Fee, MD       Facility-Administered Medications Ordered in Other Visits  Medication Dose Route Frequency Provider Last Rate Last Admin   diphenhydrAMINE (BENADRYL) injection 50 mg  50 mg Intramuscular Once PRN Artis Delay, MD  methylPREDNISolone sodium succinate (SOLU-MEDROL) 40 mg/mL injection 20 mg  20 mg Intravenous Q30 days Artis Delay, MD    20 mg at 02/09/17 0920    SUMMARY OF ONCOLOGIC HISTORY:  She was in one of the first cohorts of patients treated with FCR on protocol at the [facility] at time of her initial diagnosis in July 2002. Technically she had RAI  stage III disease due to anemia but she had no organomegaly or lymphadenopathy on exam. FCR was started in 12/2001, and continued through 06/2002. She achieved a complete hematologic response and,which has been durable now for over 11 years!  She did develop hypogammaglobulinemia. As a result, she gets recurrent bronchitis and sinusitis. In April 2013 she was started her on monthly intravenous immunoglobulin 400 mg per kilogram and this has significantly impacted on the frequency and severity of her infections. This was continued through April of 2014. She has had no new infections since stopping the IVIG but as expected, immunoglobulin levels have fallen to pretreatment levels.   She has a history of her recurrent squamous cell carcinomas of her skin and has had 2 lesions excised from her left tibial area and another 1 excised from the vulvar area. Recently, she had a basal cell carcinoma removed. She has a history of both herpes zoster and herpes simplex infections. Recently, she had abnormal Pap smear and currently on close observation. Recent colposcopy was negative.  She received IVIG treatment for acquired hypogammaglobulinemia but stopped in April 2014 due to sick serum syndrome In July 2016, IVIG is resumed with premedication In August 2016, she developed cellulitis from venipuncture, resolved with antibiotics From 2016 to 2024, she continues to have recurrent infection requiring antibiotic therapy.  She is also receiving IVIG treatment that helps reduce the risk of severe infection.   PHYSICAL EXAMINATION: ECOG PERFORMANCE STATUS: 1 - Symptomatic but completely ambulatory  GENERAL:alert, no distress and comfortable  LABORATORY DATA:  I have reviewed  the data as listed    Component Value Date/Time   NA 145 05/09/2022 0815   NA 142 06/01/2017 0756   K 4.0 05/09/2022 0815   K 4.2 06/01/2017 0756   CL 111 05/09/2022 0815   CL 109 (H) 01/20/2013 0801   CO2 27 05/09/2022 0815   CO2 21 (L) 06/01/2017 0756   GLUCOSE 107 (H) 05/09/2022 0815   GLUCOSE 88 06/01/2017 0756   GLUCOSE 89 01/20/2013 0801   BUN 19 05/09/2022 0815   BUN 14.5 06/01/2017 0756   CREATININE 0.88 05/09/2022 0815   CREATININE 1.0 06/01/2017 0756   CALCIUM 9.8 05/09/2022 0815   CALCIUM 10.1 06/01/2017 0756   PROT 6.7 05/09/2022 0815   PROT 7.7 06/01/2017 0756   ALBUMIN 4.6 05/09/2022 0815   ALBUMIN 3.9 06/01/2017 0756   AST 37 05/09/2022 0815   AST 35 (H) 06/01/2017 0756   ALT 39 05/09/2022 0815   ALT 28 06/01/2017 0756   ALKPHOS 43 05/09/2022 0815   ALKPHOS 52 06/01/2017 0756   BILITOT 1.3 (H) 05/09/2022 0815   BILITOT 0.58 06/01/2017 0756   GFRNONAA >60 05/09/2022 0815   GFRAA 58 (L) 10/07/2018 1237   GFRAA 60 (L) 05/04/2018 0922    No results found for: "SPEP", "UPEP"  Lab Results  Component Value Date   WBC 6.4 02/24/2023   NEUTROABS 3.9 02/24/2023   HGB 15.2 (H) 02/24/2023   HCT 44.8 02/24/2023   MCV 88.2 02/24/2023   PLT 116 (L) 02/24/2023  Chemistry      Component Value Date/Time   NA 145 05/09/2022 0815   NA 142 06/01/2017 0756   K 4.0 05/09/2022 0815   K 4.2 06/01/2017 0756   CL 111 05/09/2022 0815   CL 109 (H) 01/20/2013 0801   CO2 27 05/09/2022 0815   CO2 21 (L) 06/01/2017 0756   BUN 19 05/09/2022 0815   BUN 14.5 06/01/2017 0756   CREATININE 0.88 05/09/2022 0815   CREATININE 1.0 06/01/2017 0756   GLU 92 06/17/2013 0000      Component Value Date/Time   CALCIUM 9.8 05/09/2022 0815   CALCIUM 10.1 06/01/2017 0756   ALKPHOS 43 05/09/2022 0815   ALKPHOS 52 06/01/2017 0756   AST 37 05/09/2022 0815   AST 35 (H) 06/01/2017 0756   ALT 39 05/09/2022 0815   ALT 28 06/01/2017 0756   BILITOT 1.3 (H) 05/09/2022 0815   BILITOT  0.58 06/01/2017 0756

## 2023-02-24 NOTE — Assessment & Plan Note (Signed)
She has no signs or symptoms of CLL recurrence We will continue close monitoring She does not need routine surveillance imaging 

## 2023-02-24 NOTE — Assessment & Plan Note (Signed)
She has acquired hypogammaglobulinemia due to CLL and prior treatment This is a lifelong problem She believes she has benefited from IVIG infusion and did not suffer from recent recurrent infections Despite our best effort, the patient contracted COVID infection recently She is recovering well

## 2023-02-25 LAB — IGG, IGA, IGM
IgA: <5 mg/dL — ABNORMAL LOW (ref 64–422)
IgG (Immunoglobin G), Serum: 1398 mg/dL (ref 586–1602)
IgM (Immunoglobulin M), Srm: 6 mg/dL — ABNORMAL LOW (ref 26–217)

## 2023-03-23 ENCOUNTER — Inpatient Hospital Stay: Payer: Medicare Other

## 2023-03-23 ENCOUNTER — Other Ambulatory Visit: Payer: Self-pay

## 2023-03-23 VITALS — BP 150/91 | HR 93 | Temp 98.4°F | Resp 17 | Wt 195.0 lb

## 2023-03-23 DIAGNOSIS — D801 Nonfamilial hypogammaglobulinemia: Secondary | ICD-10-CM | POA: Diagnosis not present

## 2023-03-23 MED ORDER — ACETAMINOPHEN 325 MG PO TABS
650.0000 mg | ORAL_TABLET | Freq: Once | ORAL | Status: AC
  Administered 2023-03-23: 650 mg via ORAL
  Filled 2023-03-23: qty 2

## 2023-03-23 MED ORDER — METHYLPREDNISOLONE SODIUM SUCC 40 MG IJ SOLR
40.0000 mg | Freq: Once | INTRAMUSCULAR | Status: AC
  Administered 2023-03-23: 40 mg via INTRAVENOUS
  Filled 2023-03-23: qty 1

## 2023-03-23 MED ORDER — LORATADINE 10 MG PO TABS
10.0000 mg | ORAL_TABLET | Freq: Once | ORAL | Status: AC
  Administered 2023-03-23: 10 mg via ORAL
  Filled 2023-03-23: qty 1

## 2023-03-23 MED ORDER — IMMUNE GLOBULIN (HUMAN) 10 GM/100ML IV SOLN
1.0000 g/kg | Freq: Once | INTRAVENOUS | Status: AC
  Administered 2023-03-23: 90 g via INTRAVENOUS
  Filled 2023-03-23: qty 900

## 2023-03-23 MED ORDER — DEXTROSE 5 % IV SOLN: Freq: Once | INTRAVENOUS | Status: AC

## 2023-03-23 NOTE — Progress Notes (Signed)
Pt declined to stay for post 30 min obs, VSS and ambulatory to lobby upon discharge

## 2023-03-23 NOTE — Patient Instructions (Signed)

## 2023-03-24 ENCOUNTER — Inpatient Hospital Stay: Payer: Medicare Other

## 2023-03-27 ENCOUNTER — Ambulatory Visit: Payer: Medicare Other | Admitting: Physician Assistant

## 2023-03-27 ENCOUNTER — Encounter: Payer: Self-pay | Admitting: Physician Assistant

## 2023-03-27 VITALS — BP 138/80 | HR 106 | Ht 63.0 in | Wt 196.0 lb

## 2023-03-27 DIAGNOSIS — D126 Benign neoplasm of colon, unspecified: Secondary | ICD-10-CM | POA: Diagnosis not present

## 2023-03-27 DIAGNOSIS — K219 Gastro-esophageal reflux disease without esophagitis: Secondary | ICD-10-CM

## 2023-03-27 MED ORDER — PEG 3350-KCL-NA BICARB-NACL 420 G PO SOLR: 4000.0000 mL | Freq: Once | ORAL | 0 refills | Status: AC

## 2023-03-27 NOTE — Progress Notes (Signed)
Subjective:    Patient ID: Bianca Parker, female    DOB: 05-Aug-1946, 77 y.o.   MRN: [id number]  HPI  Bianca Parker is a pleasant 77 year old white female, previously established with Dr. Christella Hartigan who comes in today to discuss follow-up colonoscopy and possible EGD. Patient has history of CLL, thrombocytopenia, diverticular disease, sessile serrated polyp, chronic GERD and is being treated for hypogammaglobulinemia possibly secondary to chemotherapy.  She is getting monthly IVIG infusions.  Last colonoscopy was done in June 2019 with finding of multiple diverticuli of the left colon and one 5 mm polyp was removed from the ascending colon.  Path showed this to be a sessile serrated polyp and 5-year interval follow-up was recommended. Patient also has history of colon cancer in her mother diagnosed in her early 65s. EGD done in June 2019 showed grade a esophagitis and a medium sized hiatal hernia. She currently does not have any lower GI symptoms other than occasional mild constipation, no ongoing issues with abdominal pain, no recent changes in bowel habits, no melena or hematochezia. She is wondering if she needs another endoscopy because she has had issues with nighttime GERD symptoms and sour brash.  She had been on Prilosec years ago which she weaned herself off of it and then went to OTC famotidine.  Over the past couple of years she has not been taking the famotidine regularly just uses as needed.  She relates occasional coughing spells usually at nighttime and sometimes these are hard spells which caused her to vomit.  She has no complaints of dysphagia or odynophagia no epigastric discomfort.  Usually no heartburn symptoms during the daytime.  She will have an episode of the coughing perhaps once every 1 to 2 months.  She would like to have another colonoscopy, and would like to time the colonoscopy to coincide after an IVIG infusion.  Review of Systems Pertinent positive and negative review of  systems were noted in the above HPI section.  All other review of systems was otherwise negative.   Outpatient Encounter Medications as of 03/27/2023  Medication Sig   acyclovir (ZOVIRAX) 400 MG tablet TAKE 1 TABLET TWICE A DAY   atorvastatin (LIPITOR) 10 MG tablet Take 10 mg by mouth daily.   Calcium Carbonate-Vitamin D (CALTRATE 600+D) 600-400 MG-UNIT per tablet Take 1 tablet by mouth daily.   famotidine (PEPCID) 10 MG tablet Take 10 mg by mouth daily.   fexofenadine (ALLEGRA) 180 MG tablet Take 180 mg by mouth daily.     levothyroxine (SYNTHROID, LEVOTHROID) 50 MCG tablet Take 50 mcg by mouth daily before breakfast.    mupirocin ointment (BACTROBAN) 2 % Apply 1 application topically as needed.    polyethylene glycol-electrolytes (NULYTELY) 420 g solution Take 4,000 mLs by mouth once for 1 dose.   Facility-Administered Encounter Medications as of 03/27/2023  Medication   0.9 %  sodium chloride infusion   diphenhydrAMINE (BENADRYL) injection 50 mg   methylPREDNISolone sodium succinate (SOLU-MEDROL) 40 mg/mL injection 20 mg   Allergies  Allergen Reactions   Penicillins    Pentamidine    Sulfonamide Derivatives    Patient Active Problem List   Diagnosis Date Noted   Squamous cell carcinoma in situ (SCCIS) of skin of calf 05/09/2022   Goals of care, counseling/discussion 11/08/2019   Preventive measure 05/09/2019   Diverticulitis 06/01/2017   Exercise-induced tachycardia 07/25/2016   Pre-syncope 07/25/2016   Thrombocytopenia (HCC) 12/17/2015   Rectal bleeding 07/24/2015   Enlarged lymph nodes 04/29/2015   Cellulitis  04/16/2015   Excessive weight gain 03/07/2015   Hyperlipidemia 06/07/2014   Acute bronchitis 07/17/2012   Hepatitis A antibody positive 04/06/2012   Hepatitis B antibody positive 04/06/2012   Diarrhea 02/16/2012   Cyst of right kidney 01/09/2012   LFT elevation    Hypogammaglobulinemia, acquired (HCC) 11/18/2011   Dyslipidemia 05/22/2011   RHINOSINUSITIS, CHRONIC  11/01/2010   Drug allergy 11/01/2010   History of skin cancer 02/20/2010   Personal history of CLL (chronic lymphocytic leukemia) 02/20/2010   ANEMIA-NOS 02/20/2010   Seasonal and perennial allergic rhinitis 02/20/2010   Social History   Socioeconomic History   Marital status: Married    Spouse name: Not on file   Number of children: 2   Years of education: Not on file   Highest education level: Not on file  Occupational History   Occupation: retired  Tobacco Use   Smoking status: Never   Smokeless tobacco: Never   Tobacco comments:    Married, lives with husband Bianca Parker), grown children. 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  Vaping Use   Vaping status: Never Used  Substance and Sexual Activity   Alcohol use: Not Currently    Comment: Rare   Drug use: No   Sexual activity: Yes  Other Topics Concern   Not on file  Social History Narrative   Not on file   Social Determinants of Health   Financial Resource Strain: Not on file  Food Insecurity: Not on file  Transportation Needs: Not on file  Physical Activity: Not on file  Stress: Not on file  Social Connections: Not on file  Intimate Partner Violence: Not on file    Bianca Parker's family history includes Breast cancer in her paternal aunt, paternal grandmother, and another family member; Cancer in her maternal grandmother, mother, and paternal grandmother; Colon cancer in her mother; Ovarian cancer in an other family member.      Objective:    Vitals:   03/27/23 0823 03/27/23 0928  BP: (!) 140/80 138/80  Pulse: (!) 106     Physical Exam Well-developed well-nourished elderly WF in no acute distress.  Height, Weight, 196 BMI 34.7  HEENT; nontraumatic normocephalic, EOMI, PE R LA, sclera anicteric. Oropharynx;not examined today  Neck; supple, no JVD Cardiovascular; regular rate and rhythm with S1-S2, no murmur rub or gallop Pulmonary; Clear bilaterally Abdomen; soft,obese  nontender, nondistended, no  palpable mass or hepatosplenomegaly, bowel sounds are active Rectal;not done Skin; benign exam, no jaundice rash or appreciable lesions Extremities; no clubbing cyanosis or edema skin warm and dry Neuro/Psych; alert and oriented x4, grossly nonfocal mood and affect appropriate        Assessment & Plan:   #34 77 year old white female with history of sessile serrated polyp at time of last colonoscopy June 2019, and family history of colon cancer in her mother diagnosed in early 56s. Patient had been slated for 5-year interval follow-up.  #2 GERD with intermittent nocturnal sour brash/reflux.  No dysphagia or odynophagia, no current regular use of H2 blocker or PPI. EGD 2019 with grade a esophagitis and a medium sized hiatal hernia.  #3 hypogammaglobulinemia-requiring monthly IVIG infusions #4 diverticulosis #5.  History of CLL #6 thrombocytopenia-last platelet count 116 on 02/24/2023  Plan patient would like to proceed with colonoscopy at this time, I do not think she needs repeat EGD.  She will be scheduled for colonoscopy with Dr. Leone Payor, who had availability to coincide with being post IV Ig infusion.  Procedure was discussed in detail including  indications risk and benefits and she is agreeable to proceed.  I discussed that she probably will not need further surveillance colonoscopies after this upcoming colonoscopy at her current age of 53.  We discussed tightening up her antireflux regimen, n.p.o. for at least 3 hours prior to bedtime, elevation of the head of the bed 45 degrees. Advised restarting famotidine 20 mg p.o. every afternoon after dinner.  She had questions about hemorrhoids, she did not have any internal hemorrhoids noted at the time of last colonoscopy, she feels that she may have an external hemorrhoid that bleeds a little off and on.  Advised we would see what colonoscopy showed, if she has an internal hemorrhoid she relates that she may be interested in hemorrhoidal  banding.   Bianca Hillock PA-C 03/27/2023   Cc: Tisovec, Adelfa Koh, MD

## 2023-03-27 NOTE — Patient Instructions (Addendum)
Restart  your Pepcid OTC every night.  _______________________________________________________  If your blood pressure at your visit was 140/90 or greater, please contact your primary care physician to follow up on this.  _______________________________________________________  If you are age 76 or older, your body mass index should be between 23-30. Your Body mass index is 34.72 kg/m. If this is out of the aforementioned range listed, please consider follow up with your Primary Care Provider.  If you are age 26 or younger, your body mass index should be between 19-25. Your Body mass index is 34.72 kg/m. If this is out of the aformentioned range listed, please consider follow up with your Primary Care Provider.   ________________________________________________________  The [facility] providers would like to encourage you to use [facility] to communicate with providers for non-urgent requests or questions.  Due to long hold times on the telephone, sending your provider a message by [facility] may be a faster and more efficient way to get a response.  Please allow 48 business hours for a response.  Please remember that this is for non-urgent requests.  __________________________________________________  Bonita Quin have been scheduled for a colonoscopy. Please follow written instructions given to you at your visit today.   Please pick up your prep supplies at the pharmacy within the next 1-3 days.  If you use inhalers (even only as needed), please bring them with you on the day of your procedure.  DO NOT TAKE 7 DAYS PRIOR TO TEST- Trulicity (dulaglutide) Ozempic, Wegovy (semaglutide) Mounjaro (tirzepatide) Bydureon Bcise (exanatide extended release)  DO NOT TAKE 1 DAY PRIOR TO YOUR TEST Rybelsus (semaglutide) Adlyxin (lixisenatide) Victoza (liraglutide) Byetta (exanatide) ___________________________________________________________________________   It was a pleasure to see you today!  Thank  you for trusting me with your gastrointestinal care!

## 2023-04-20 ENCOUNTER — Inpatient Hospital Stay: Payer: Medicare Other | Attending: Hematology and Oncology

## 2023-04-20 VITALS — BP 155/75 | HR 63 | Temp 97.7°F | Resp 16 | Ht 63.0 in | Wt 200.0 lb

## 2023-04-20 DIAGNOSIS — D801 Nonfamilial hypogammaglobulinemia: Secondary | ICD-10-CM | POA: Diagnosis not present

## 2023-04-20 MED ORDER — DEXTROSE 5 % IV SOLN: INTRAVENOUS | Status: DC

## 2023-04-20 MED ORDER — LORATADINE 10 MG PO TABS
10.0000 mg | ORAL_TABLET | Freq: Once | ORAL | Status: AC
  Administered 2023-04-20: 10 mg via ORAL
  Filled 2023-04-20: qty 1

## 2023-04-20 MED ORDER — ACETAMINOPHEN 325 MG PO TABS
650.0000 mg | ORAL_TABLET | Freq: Once | ORAL | Status: AC
  Administered 2023-04-20: 650 mg via ORAL
  Filled 2023-04-20: qty 2

## 2023-04-20 MED ORDER — IMMUNE GLOBULIN (HUMAN) 10 GM/100ML IV SOLN
1.0000 g/kg | Freq: Once | INTRAVENOUS | Status: AC
  Administered 2023-04-20: 90 g via INTRAVENOUS
  Filled 2023-04-20: qty 900

## 2023-04-20 MED ORDER — METHYLPREDNISOLONE SODIUM SUCC 40 MG IJ SOLR
40.0000 mg | Freq: Once | INTRAMUSCULAR | Status: AC
  Administered 2023-04-20: 40 mg via INTRAVENOUS
  Filled 2023-04-20: qty 1

## 2023-04-20 NOTE — Patient Instructions (Signed)

## 2023-04-20 NOTE — Progress Notes (Signed)
Pt declined to stay for 30 min observation period post infusion. VSS at time of discharge. No hx adverse s/s. Pt ambulated independently to lobby for d/c. Declined AVS

## 2023-04-21 ENCOUNTER — Inpatient Hospital Stay: Payer: Medicare Other

## 2023-04-21 ENCOUNTER — Telehealth: Payer: Self-pay | Admitting: Physician Assistant

## 2023-04-21 NOTE — Telephone Encounter (Signed)
Confirmed with admitting that patient will be fine to proceed with procedure.

## 2023-04-21 NOTE — Telephone Encounter (Signed)
Inbound call from patient stating that she is scheduled for a colonoscopy on 8/30 at 10:00 with Dr. Leone Payor and she realized that she was not supposed to take Aleve. Patient stated that she took one yesterday. Patient is requesting a call to discuss if she is okay to proceed with procedure. Please advise.

## 2023-04-23 NOTE — Progress Notes (Signed)
[facility] History and Physical   Primary Care Physician:  Tisovec, Adelfa Koh, MD   Reason for Procedure:   Hx colon polyp  Plan:    colonoscopy     HPI: Bianca Parker is a 77 y.o. female s/p removal of small ssp 2019 See note of 03/27/23 by Mike Gip, PA-C also.    Past Medical History:  Diagnosis Date   Allergic rhinitis, cause unspecified    Anemia    Cancer (HCC)    CARCINOMA, SKIN, SQUAMOUS CELL    Cataract    bilateral cataracts    Chronic lymphoid leukemia in remission [facility]) 2002 dx, tx 2003   Diagnosed in July 2002. Started on clinical trial at [facility] in June 2003. Now she is reportedly in remission but has a very compromised immune system. She is intermittently been doing IVIG and then was restarted back in July 2016. Now she doesn't have a month.    Colon polyps    Diverticulitis    Dyslipidemia    GERD (gastroesophageal reflux disease)    GERD (gastroesophageal reflux disease)    Hepatitis A antibody positive 04/06/2012   Acute diarrheal illness & elevated liver function tests 02/18/12;    Hyperlipidemia    Hypertension    Hypogammaglobulinemia, acquired (HCC) 11/18/2011   Due to CLL and prior chemo   Obesity    Rectal bleeding 07/24/2015   Thyroid disease     Past Surgical History:  Procedure Laterality Date   ABDOMINAL HYSTERECTOMY  1966   BUNIONECTOMY     SIGMOIDOSCOPY     SKIN BIOPSY     TONSILLECTOMY      Prior to Admission medications   Medication Sig Start Date End Date Taking? Authorizing Provider  acyclovir (ZOVIRAX) 400 MG tablet TAKE 1 TABLET TWICE A DAY 07/21/22   Artis Delay, MD  atorvastatin (LIPITOR) 10 MG tablet Take 10 mg by mouth daily.    [provider]  Calcium Carbonate-Vitamin D (CALTRATE 600+D) 600-400 MG-UNIT per tablet Take 1 tablet by mouth daily.    [provider]  famotidine (PEPCID) 10 MG tablet Take 10 mg by mouth daily.    [provider]  fexofenadine (ALLEGRA)  180 MG tablet Take 180 mg by mouth daily.      [provider]  levothyroxine (SYNTHROID, LEVOTHROID) 50 MCG tablet Take 50 mcg by mouth daily before breakfast.  08/01/15   [provider]  mupirocin ointment (BACTROBAN) 2 % Apply 1 application topically as needed.  08/01/15   [provider]    Current Outpatient Medications  Medication Sig Dispense Refill   acyclovir (ZOVIRAX) 400 MG tablet TAKE 1 TABLET TWICE A DAY 180 tablet 3   atorvastatin (LIPITOR) 10 MG tablet Take 10 mg by mouth daily.     Calcium Carbonate-Vitamin D (CALTRATE 600+D) 600-400 MG-UNIT per tablet Take 1 tablet by mouth daily.     diphenhydrAMINE (BENADRYL) 25 mg capsule Take 25 mg by mouth every 6 (six) hours as needed. Takes after IVIG infusions     famotidine (PEPCID) 20 MG tablet 20 mg at bedtime.     fexofenadine (ALLEGRA) 180 MG tablet Take 180 mg by mouth daily.       ibuprofen (ADVIL) 200 MG tablet Take 200 mg by mouth every 6 (six) hours as needed. Takes after IVIG infusions     levothyroxine (SYNTHROID, LEVOTHROID) 50 MCG tablet Take 50 mcg by mouth daily before breakfast.      Multiple Vitamins-Minerals (PRESERVISION  AREDS PO) Take by mouth daily.     mupirocin ointment (BACTROBAN) 2 % Apply 1 application topically as needed.      Current Facility-Administered Medications  Medication Dose Route Frequency Provider Last Rate Last Admin   0.9 %  sodium chloride infusion  500 mL Intravenous Once Iva Boop, MD       Facility-Administered Medications Ordered in Other Visits  Medication Dose Route Frequency Provider Last Rate Last Admin   diphenhydrAMINE (BENADRYL) injection 50 mg  50 mg Intramuscular Once PRN Bertis Ruddy, Ni, MD       methylPREDNISolone sodium succinate (SOLU-MEDROL) 40 mg/mL injection 20 mg  20 mg Intravenous Q30 days Artis Delay, MD   20 mg at 02/09/17 0920    Allergies as of 04/24/2023 - Review Complete 04/24/2023  Allergen Reaction Noted   Penicillins      Pentamidine Other (See Comments) 11/14/2011   Sulfonamide derivatives      Family History  Problem Relation Age of Onset   Colon cancer Mother    Cancer Mother        colon   Breast cancer Paternal Aunt    Cancer Maternal Grandmother        ovarian   Cancer Paternal Grandmother        breast   Breast cancer Paternal Grandmother    Breast cancer Other    Ovarian cancer Other    Esophageal cancer Neg Hx    Rectal cancer Neg Hx    Stomach cancer Neg Hx     Social History   Socioeconomic History   Marital status: Married    Spouse name: Not on file   Number of children: 2   Years of education: Not on file   Highest education level: Not on file  Occupational History   Occupation: retired  Tobacco Use   Smoking status: Never   Smokeless tobacco: Never   Tobacco comments:    Married, lives with husband [name]), grown children. 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  Physical Exam: Vital signs BP (!) 158/86   Pulse 80   Temp 99.1 F (37.3 C) (Temporal)   Ht 5\' 3"  (1.6 m)   Wt 196 lb (88.9 kg)   SpO2 96%   BMI 34.72 kg/m   General:   Alert,  Well-developed, well-nourished, pleasant and cooperative in NAD Lungs:  Clear throughout to auscultation.   Heart:  Regular rate and rhythm; no murmurs, clicks, rubs,  or gallops. Abdomen:  Soft, nontender and nondistended. Normal  bowel sounds.   Neuro/Psych:  Alert and cooperative. Normal mood and affect. A and O x 3   @[name]  Sena Slate, MD, [facility] [phone] (pager) 04/24/2023 9:46 AM@

## 2023-04-24 ENCOUNTER — Encounter: Payer: Self-pay | Admitting: Internal Medicine

## 2023-04-24 ENCOUNTER — Ambulatory Visit (AMBULATORY_SURGERY_CENTER): Payer: Medicare Other | Admitting: Internal Medicine

## 2023-04-24 VITALS — BP 191/101 | HR 55 | Temp 99.1°F | Resp 20 | Ht 63.0 in | Wt 196.0 lb

## 2023-04-24 DIAGNOSIS — Z86010 Personal history of colonic polyps: Secondary | ICD-10-CM | POA: Diagnosis not present

## 2023-04-24 DIAGNOSIS — Z09 Encounter for follow-up examination after completed treatment for conditions other than malignant neoplasm: Secondary | ICD-10-CM | POA: Diagnosis not present

## 2023-04-24 DIAGNOSIS — D126 Benign neoplasm of colon, unspecified: Secondary | ICD-10-CM

## 2023-04-24 MED ORDER — SODIUM CHLORIDE 0.9 % IV SOLN: 500.0000 mL | Freq: Once | INTRAVENOUS | Status: DC

## 2023-04-24 NOTE — Progress Notes (Signed)
Patient complained of reflux symptoms in pre-procedure. Decision made to proceed with case, d/t patient being prepped. Decision made by CRNA to give IV Zofran and keep patient sedated, yet 'light'. Patient did reflux gastric contents, which were promptly suction from oropharynx using Yankauer. MD made aware. HOB kept elevated throughout entire procedure. No evidence of pulmonary aspiration. Patient able to swallow throughout procedure as well. Otherwise, uneventful anesthetic.  Report to pacu rn. Vss on RA. Care resumed by rn.

## 2023-04-24 NOTE — Progress Notes (Signed)
Pt's states no medical or surgical changes since previsit or office visit. 

## 2023-04-24 NOTE — Op Note (Signed)
[facility] Patient Name: Bianca Parker Procedure Date: 04/24/2023 9:51 AM MRN: [id number] Endoscopist: Iva Boop , MD, [id number] Age: 77 Referring MD:  Date of Birth: 1945-12-30 Gender: Female Account #: [id number] Procedure:                Colonoscopy Indications:              High risk colon cancer surveillance: Personal                            history of sessile serrated colon polyp (less than                            10 mm in size) with no dysplasia, Last colonoscopy:                            2019 Medicines:                Monitored Anesthesia Care Procedure:                Pre-Anesthesia Assessment:                           - Prior to the procedure, a History and Physical                            was performed, and patient medications and                            allergies were reviewed. The patient's tolerance of                            previous anesthesia was also reviewed. The risks                            and benefits of the procedure and the sedation                            options and risks were discussed with the patient.                            All questions were answered, and informed consent                            was obtained. Prior Anticoagulants: The patient has                            taken no anticoagulant or antiplatelet agents. ASA                            Grade Assessment: II - A patient with mild systemic                            disease. After reviewing the risks and benefits,  the patient was deemed in satisfactory condition to                            undergo the procedure.                           After obtaining informed consent, the colonoscope                            was passed under direct vision. Throughout the                            procedure, the patient's blood pressure, pulse, and                            oxygen saturations were monitored continuously. The                             CF [id number] #[id number] was introduced through the anus                            and advanced to the the cecum, identified by the                            ileocecal valve. The [id number] Slim SN [id number] was                            introduced through the and advanced to the. The                            colonoscopy was technically difficult and complex                            due to restricted mobility of the colon and a                            tortuous colon. Successful completion of the                            procedure was aided by changing endoscopes. The                            patient tolerated the procedure well. The quality                            of the bowel preparation was good. The ileocecal                            valve and the rectum were photographed. Scope In: 10:09:27 AM Scope Out: 10:26:54 AM Scope Withdrawal Time: 0 hours 11 minutes 27 seconds  Total Procedure Duration: 0 hours 17 minutes 27 seconds  Findings:                 The perianal and digital  rectal examinations were                            normal.                           Multiple diverticula were found in the sigmoid                            colon. There was narrowing of the colon in                            association with the diverticular opening.                           Internal hemorrhoids were found.                           The exam was otherwise without abnormality on                            direct and retroflexion views. Complications:            No immediate complications. Estimated Blood Loss:     Estimated blood loss: none. Impression:               - Severe diverticulosis in the sigmoid colon. There                            was narrowing of the colon in association with the                            diverticular opening. This required ultraslim                            colonoscope to pass.                           - Internal  hemorrhoids.                           - The examination was otherwise normal on direct                            and retroflexion views. Cecum reached but not                            entered despite abdominal pressure and scope                            manipulation - there was looping and she was also                            regurgitating requiring suctioning.                           - No specimens collected.                           -  Personal history of colonic polyp diminutive ssp                            2019. Recommendation:           - Patient has a contact number available for                            emergencies. The signs and symptoms of potential                            delayed complications were discussed with the                            patient. Return to normal activities tomorrow.                            Written discharge instructions were provided to the                            patient.                           - Resume previous diet.                           - Continue present medications.                           - No repeat colonoscopy due to age and the absence                            of colonic polyps.                           - May consider hemorrhoid banding if hemorrhoid                            problems worsen. Iva Boop, MD 04/24/2023 10:36:43 AM This report has been signed electronically.

## 2023-04-24 NOTE — Patient Instructions (Addendum)
No polyps or cancer were seen. I had to use a very thin and flexible scope due to the diverticulosis, and I was unable to see the very beginning of the colon completely.  There is an internal hemorrhoid that is swollen and that may bother you at times. If it gets severe we can consider treatment with banding.  Please read the hemmorrhoid handout.  I appreciate the opportunity to care for you. Iva Boop, MD, [facility]   Recommendation:  - Patient has a contact number available for                            emergencies. The signs and symptoms of potential                            delayed complications were discussed with the                            patient. Return to normal activities tomorrow.                            Written discharge instructions were provided to the                            patient.                           - Resume previous diet.                           - Continue present medications.                           - No repeat colonoscopy due to age and the absence                            of colonic polyps.                           - May consider hemorrhoid banding if hemorrhoid                            problems worsen.  Handouts on polyps and diverticulosis given.  YOU HAD AN ENDOSCOPIC PROCEDURE TODAY AT THE [facility]:   Refer to the procedure report that was given to you for any specific questions about what was found during the examination.  If the procedure report does not answer your questions, please call your gastroenterologist to clarify.  If you requested that your care partner not be given the details of your procedure findings, then the procedure report has been included in a sealed envelope for you to review at your convenience later.  YOU SHOULD EXPECT: Some feelings of bloating in the abdomen. Passage of more gas than usual.  Walking can help get rid of the air that was put into your GI tract during the procedure and reduce  the bloating. If you had a lower endoscopy (such as a colonoscopy or flexible sigmoidoscopy) you may notice spotting of blood in your stool or on  the toilet paper. If you underwent a bowel prep for your procedure, you may not have a normal bowel movement for a few days.  Please Note:  You might notice some irritation and congestion in your nose or some drainage.  This is from the oxygen used during your procedure.  There is no need for concern and it should clear up in a day or so.  SYMPTOMS TO REPORT IMMEDIATELY:  Following lower endoscopy (colonoscopy or flexible sigmoidoscopy):  Excessive amounts of blood in the stool  Significant tenderness or worsening of abdominal pains  Swelling of the abdomen that is new, acute  Fever of 100F or higher  For urgent or emergent issues, a gastroenterologist can be reached at any hour by calling [phone]. Do not use MyChart messaging for urgent concerns.    DIET:  We do recommend a small meal at first, but then you may proceed to your regular diet.  Drink plenty of fluids but you should avoid alcoholic beverages for 24 hours.  ACTIVITY:  You should plan to take it easy for the rest of today and you should NOT DRIVE or use heavy machinery until tomorrow (because of the sedation medicines used during the test).    FOLLOW UP: Our staff will call the number listed on your records the next business day following your procedure.  We will call around 7:15- 8:00 am to check on you and address any questions or concerns that you may have regarding the information given to you following your procedure. If we do not reach you, we will leave a message.     If any biopsies were taken you will be contacted by phone or by letter within the next 1-3 weeks.  Please call us at [phone] if you have not heard about the biopsies in 3 weeks.    SIGNATURES/CONFIDENTIALITY: You and/or your care partner have signed paperwork which will be entered into your  electronic medical record.  These signatures attest to the fact that that the information above on your After Visit Summary has been reviewed and is understood.  Full responsibility of the confidentiality of this discharge information lies with you and/or your care-partner.

## 2023-04-28 ENCOUNTER — Telehealth: Payer: Self-pay | Admitting: *Deleted

## 2023-04-28 NOTE — Telephone Encounter (Signed)
  Follow up Call-     04/24/2023    9:15 AM  Call back number  Post procedure Call Back phone  # [phone]  Permission to leave phone message Yes     Patient questions:  Do you have a fever, pain , or abdominal swelling? No. Pain Score  0 *  Have you tolerated food without any problems? Yes.    Have you been able to return to your normal activities? Yes.    Do you have any questions about your discharge instructions: Diet   No. Medications  No. Follow up visit  No.  Do you have questions or concerns about your Care? No.  Actions: * If pain score is 4 or above: No action needed, pain <4.

## 2023-05-18 ENCOUNTER — Inpatient Hospital Stay: Payer: Medicare Other | Attending: Hematology and Oncology

## 2023-05-18 VITALS — BP 168/82 | HR 81 | Temp 98.0°F | Resp 16 | Wt 199.5 lb

## 2023-05-18 DIAGNOSIS — D801 Nonfamilial hypogammaglobulinemia: Secondary | ICD-10-CM | POA: Diagnosis present

## 2023-05-18 MED ORDER — ACETAMINOPHEN 325 MG PO TABS
650.0000 mg | ORAL_TABLET | Freq: Once | ORAL | Status: AC
  Administered 2023-05-18: 650 mg via ORAL
  Filled 2023-05-18: qty 2

## 2023-05-18 MED ORDER — METHYLPREDNISOLONE SODIUM SUCC 40 MG IJ SOLR
40.0000 mg | Freq: Once | INTRAMUSCULAR | Status: AC
  Administered 2023-05-18: 40 mg via INTRAVENOUS
  Filled 2023-05-18: qty 1

## 2023-05-18 MED ORDER — LORATADINE 10 MG PO TABS
10.0000 mg | ORAL_TABLET | Freq: Once | ORAL | Status: AC
  Administered 2023-05-18: 10 mg via ORAL
  Filled 2023-05-18: qty 1

## 2023-05-18 MED ORDER — IMMUNE GLOBULIN (HUMAN) 10 GM/100ML IV SOLN
1.0000 g/kg | Freq: Once | INTRAVENOUS | Status: AC
  Administered 2023-05-18: 90 g via INTRAVENOUS
  Filled 2023-05-18: qty 900

## 2023-05-18 MED ORDER — DEXTROSE 5 % IV SOLN: INTRAVENOUS | Status: DC

## 2023-05-18 NOTE — Patient Instructions (Signed)

## 2023-05-19 ENCOUNTER — Inpatient Hospital Stay: Payer: Medicare Other

## 2023-06-16 ENCOUNTER — Inpatient Hospital Stay: Payer: Medicare Other

## 2023-06-16 ENCOUNTER — Inpatient Hospital Stay (HOSPITAL_BASED_OUTPATIENT_CLINIC_OR_DEPARTMENT_OTHER): Payer: Medicare Other | Admitting: Hematology and Oncology

## 2023-06-16 ENCOUNTER — Encounter: Payer: Self-pay | Admitting: Hematology and Oncology

## 2023-06-16 ENCOUNTER — Inpatient Hospital Stay: Payer: Medicare Other | Attending: Hematology and Oncology

## 2023-06-16 VITALS — BP 159/74 | HR 87 | Temp 97.8°F | Resp 18

## 2023-06-16 VITALS — BP 141/80 | HR 87 | Temp 98.3°F | Resp 18 | Ht 63.0 in | Wt 190.0 lb

## 2023-06-16 DIAGNOSIS — Z856 Personal history of leukemia: Secondary | ICD-10-CM

## 2023-06-16 DIAGNOSIS — D801 Nonfamilial hypogammaglobulinemia: Secondary | ICD-10-CM

## 2023-06-16 LAB — CBC WITH DIFFERENTIAL/PLATELET
Abs Immature Granulocytes: 0.01 10*3/uL (ref 0.00–0.07)
Basophils Absolute: 0 10*3/uL (ref 0.0–0.1)
Basophils Relative: 0 %
Eosinophils Absolute: 0.1 10*3/uL (ref 0.0–0.5)
Eosinophils Relative: 1 %
HCT: 45.8 % (ref 36.0–46.0)
Hemoglobin: 15.3 g/dL — ABNORMAL HIGH (ref 12.0–15.0)
Immature Granulocytes: 0 %
Lymphocytes Relative: 43 %
Lymphs Abs: 2.3 10*3/uL (ref 0.7–4.0)
MCH: 29.4 pg (ref 26.0–34.0)
MCHC: 33.4 g/dL (ref 30.0–36.0)
MCV: 87.9 fL (ref 80.0–100.0)
Monocytes Absolute: 0.4 10*3/uL (ref 0.1–1.0)
Monocytes Relative: 8 %
Neutro Abs: 2.6 10*3/uL (ref 1.7–7.7)
Neutrophils Relative %: 48 %
Platelets: 151 10*3/uL (ref 150–400)
RBC: 5.21 MIL/uL — ABNORMAL HIGH (ref 3.87–5.11)
RDW: 13.5 % (ref 11.5–15.5)
WBC: 5.3 10*3/uL (ref 4.0–10.5)
nRBC: 0 % (ref 0.0–0.2)

## 2023-06-16 MED ORDER — LORATADINE 10 MG PO TABS
10.0000 mg | ORAL_TABLET | Freq: Once | ORAL | Status: AC
  Administered 2023-06-16: 10 mg via ORAL
  Filled 2023-06-16: qty 1

## 2023-06-16 MED ORDER — ACETAMINOPHEN 325 MG PO TABS
650.0000 mg | ORAL_TABLET | Freq: Once | ORAL | Status: AC
  Administered 2023-06-16: 650 mg via ORAL
  Filled 2023-06-16: qty 2

## 2023-06-16 MED ORDER — METHYLPREDNISOLONE SODIUM SUCC 40 MG IJ SOLR
40.0000 mg | Freq: Once | INTRAMUSCULAR | Status: AC
Start: 2023-06-16 — End: 2023-06-16
  Administered 2023-06-16: 40 mg via INTRAVENOUS
  Filled 2023-06-16: qty 1

## 2023-06-16 MED ORDER — IMMUNE GLOBULIN (HUMAN) 10 GM/100ML IV SOLN
1.0000 g/kg | Freq: Once | INTRAVENOUS | Status: AC
  Administered 2023-06-16: 85 g via INTRAVENOUS
  Filled 2023-06-16: qty 50

## 2023-06-16 NOTE — Assessment & Plan Note (Signed)
She has acquired hypogammaglobulinemia due to CLL and prior treatment This is a lifelong problem She believes she has benefited from IVIG infusion and did not suffer from recent recurrent infections She will return here once a month and I will see her back in 6 months for further follow-up

## 2023-06-16 NOTE — Assessment & Plan Note (Signed)
She has no signs or symptoms of CLL recurrence We will continue close monitoring She does not need routine surveillance imaging 

## 2023-06-16 NOTE — Patient Instructions (Signed)
[facility] AT [facility]  Discharge Instructions: Thank you for choosing [facility] to provide your oncology and hematology care.   If you have a lab appointment with the Cancer Center, please go directly to the Cancer Center and check in at the registration area.   Wear comfortable clothing and clothing appropriate for easy access to any Portacath or PICC line.   We strive to give you quality time with your provider. You may need to reschedule your appointment if you arrive late (15 or more minutes).  Arriving late affects you and other patients whose appointments are after yours.  Also, if you miss three or more appointments without notifying the office, you may be dismissed from the clinic at the provider's discretion.      For prescription refill requests, have your pharmacy contact our office and allow 72 hours for refills to be completed.    Today you received the following chemotherapy and/or immunotherapy agents: Privigen.      To help prevent nausea and vomiting after your treatment, we encourage you to take your nausea medication as directed.  BELOW ARE SYMPTOMS THAT SHOULD BE REPORTED IMMEDIATELY: *FEVER GREATER THAN 100.4 F (38 C) OR HIGHER *CHILLS OR SWEATING *NAUSEA AND VOMITING THAT IS NOT CONTROLLED WITH YOUR NAUSEA MEDICATION *UNUSUAL SHORTNESS OF BREATH *UNUSUAL BRUISING OR BLEEDING *URINARY PROBLEMS (pain or burning when urinating, or frequent urination) *BOWEL PROBLEMS (unusual diarrhea, constipation, pain near the anus) TENDERNESS IN MOUTH AND THROAT WITH OR WITHOUT PRESENCE OF ULCERS (sore throat, sores in mouth, or a toothache) UNUSUAL RASH, SWELLING OR PAIN  UNUSUAL VAGINAL DISCHARGE OR ITCHING   Items with * indicate a potential emergency and should be followed up as soon as possible or go to the Emergency Department if any problems should occur.  Please show the CHEMOTHERAPY ALERT CARD or IMMUNOTHERAPY ALERT CARD at  check-in to the Emergency Department and triage nurse.  Should you have questions after your visit or need to cancel or reschedule your appointment, please contact [facility]  Dept: [phone]  and follow the prompts.  Office hours are 8:00 a.m. to 4:30 p.m. Monday - Friday. Please note that voicemails left after 4:00 p.m. may not be returned until the following business day.  We are closed weekends and major holidays. You have access to a nurse at all times for urgent questions. Please call the main number to the clinic Dept: [phone] and follow the prompts.   For any non-urgent questions, you may also contact your provider using MyChart. We now offer e-Visits for anyone 56 and older to request care online for non-urgent symptoms. For details visit [email or web address].   Also download the MyChart app! Go to the app store, search "MyChart", open the app, select [place], and log in with your MyChart username and password.

## 2023-06-16 NOTE — Progress Notes (Signed)
[facility] OFFICE PROGRESS NOTE  Patient Care Team: Tisovec, Adelfa Koh, MD as PCP - General (Internal Medicine) Waymon Budge, MD as Referring Physician (Pulmonary Disease) Drema Halon, MD (Inactive) as Referring Physician (Otolaryngology) Rachael Fee, MD (Gastroenterology) Richardean Chimera, MD (Obstetrics and Gynecology) Artis Delay, MD as Consulting Physician (Hematology and Oncology) Elmon Else, MD as Consulting Physician (Dermatology) Janet Berlin, MD as Consulting Physician (Ophthalmology)  ASSESSMENT & PLAN:  Hypogammaglobulinemia, acquired [facility]) She has acquired hypogammaglobulinemia due to CLL and prior treatment This is a lifelong problem She believes she has benefited from IVIG infusion and did not suffer from recent recurrent infections She will return here once a month and I will see her back in 6 months for further follow-up  Personal history of CLL (chronic lymphocytic leukemia) She has no signs or symptoms of CLL recurrence We will continue close monitoring She does not need routine surveillance imaging  No orders of the defined types were placed in this encounter.   All questions were answered. The patient knows to call the clinic with any problems, questions or concerns. The total time spent in the appointment was 20 minutes encounter with patients including review of chart and various tests results, discussions about plan of care and coordination of care plan   Artis Delay, MD 06/16/2023 10:03 AM  INTERVAL HISTORY: Please see below for problem oriented charting. she returns for follow-up Since last time I saw her, she has no further infection We discussed preventative strategies She tolerated IVIG well and we discussed plan of care  REVIEW OF SYSTEMS:   Constitutional: Denies fevers, chills or abnormal weight loss Eyes: Denies blurriness of vision Ears, nose, mouth, throat, and face: Denies mucositis or sore  throat Respiratory: Denies cough, dyspnea or wheezes Cardiovascular: Denies palpitation, chest discomfort or lower extremity swelling Gastrointestinal:  Denies nausea, heartburn or change in bowel habits Skin: Denies abnormal skin rashes Lymphatics: Denies new lymphadenopathy or easy bruising Neurological:Denies numbness, tingling or new weaknesses Behavioral/Psych: Mood is stable, no new changes  All other systems were reviewed with the patient and are negative.  I have reviewed the past medical history, past surgical history, social history and family history with the patient and they are unchanged from previous note.  ALLERGIES:  is allergic to penicillins, pentamidine, and sulfonamide derivatives.  MEDICATIONS:  Current Outpatient Medications  Medication Sig Dispense Refill   acyclovir (ZOVIRAX) 400 MG tablet TAKE 1 TABLET TWICE A DAY 180 tablet 3   atorvastatin (LIPITOR) 10 MG tablet Take 10 mg by mouth daily.     Calcium Carbonate-Vitamin D (CALTRATE 600+D) 600-400 MG-UNIT per tablet Take 1 tablet by mouth daily.     diphenhydrAMINE (BENADRYL) 25 mg capsule Take 25 mg by mouth every 6 (six) hours as needed. Takes after IVIG infusions     famotidine (PEPCID) 20 MG tablet 20 mg at bedtime.     fexofenadine (ALLEGRA) 180 MG tablet Take 180 mg by mouth daily.       ibuprofen (ADVIL) 200 MG tablet Take 200 mg by mouth every 6 (six) hours as needed. Takes after IVIG infusions     levothyroxine (SYNTHROID, LEVOTHROID) 50 MCG tablet Take 50 mcg by mouth daily before breakfast.      Multiple Vitamins-Minerals (PRESERVISION AREDS PO) Take by mouth daily.     mupirocin ointment (BACTROBAN) 2 % Apply 1 application topically as needed.      No current facility-administered medications for this visit.   Facility-Administered Medications Ordered  in Other Visits  Medication Dose Route Frequency Provider Last Rate Last Admin   diphenhydrAMINE (BENADRYL) injection 50 mg  50 mg Intramuscular Once  PRN Bertis Ruddy, Remijio Holleran, MD       methylPREDNISolone sodium succinate (SOLU-MEDROL) 40 mg/mL injection 20 mg  20 mg Intravenous Q30 days Artis Delay, MD   20 mg at 02/09/17 0920    SUMMARY OF ONCOLOGIC HISTORY:  She was in one of the first cohorts of patients treated with FCR on protocol at the [facility] at time of her initial diagnosis in July 2002. Technically she had RAI  stage III disease due to anemia but she had no organomegaly or lymphadenopathy on exam. FCR was started in 12/2001, and continued through 06/2002. She achieved a complete hematologic response and,which has been durable now for over 11 years!  She did develop hypogammaglobulinemia. As a result, she gets recurrent bronchitis and sinusitis. In April 2013 she was started her on monthly intravenous immunoglobulin 400 mg per kilogram and this has significantly impacted on the frequency and severity of her infections. This was continued through April of 2014. She has had no new infections since stopping the IVIG but as expected, immunoglobulin levels have fallen to pretreatment levels.   She has a history of her recurrent squamous cell carcinomas of her skin and has had 2 lesions excised from her left tibial area and another 1 excised from the vulvar area. Recently, she had a basal cell carcinoma removed. She has a history of both herpes zoster and herpes simplex infections. Recently, she had abnormal Pap smear and currently on close observation. Recent colposcopy was negative.  She received IVIG treatment for acquired hypogammaglobulinemia but stopped in April 2014 due to sick serum syndrome In July 2016, IVIG is resumed with premedication In August 2016, she developed cellulitis from venipuncture, resolved with antibiotics From 2016 to 2024, she continues to have recurrent infection requiring antibiotic therapy.  She is also receiving IVIG treatment that helps reduce the risk of severe infection.  PHYSICAL EXAMINATION: ECOG  PERFORMANCE STATUS: 0 - Asymptomatic  Vitals:   06/16/23 0932  BP: (!) 141/80  Pulse: 87  Resp: 18  Temp: 98.3 F (36.8 C)  SpO2: 96%   Filed Weights   06/16/23 0932  Weight: 190 lb (86.2 kg)    GENERAL:alert, no distress and comfortable  LABORATORY DATA:  I have reviewed the data as listed    Component Value Date/Time   NA 145 05/09/2022 0815   NA 142 06/01/2017 0756   K 4.0 05/09/2022 0815   K 4.2 06/01/2017 0756   CL 111 05/09/2022 0815   CL 109 (H) 01/20/2013 0801   CO2 27 05/09/2022 0815   CO2 21 (L) 06/01/2017 0756   GLUCOSE 107 (H) 05/09/2022 0815   GLUCOSE 88 06/01/2017 0756   GLUCOSE 89 01/20/2013 0801   BUN 19 05/09/2022 0815   BUN 14.5 06/01/2017 0756   CREATININE 0.88 05/09/2022 0815   CREATININE 1.0 06/01/2017 0756   CALCIUM 9.8 05/09/2022 0815   CALCIUM 10.1 06/01/2017 0756   PROT 6.7 05/09/2022 0815   PROT 7.7 06/01/2017 0756   ALBUMIN 4.6 05/09/2022 0815   ALBUMIN 3.9 06/01/2017 0756   AST 37 05/09/2022 0815   AST 35 (H) 06/01/2017 0756   ALT 39 05/09/2022 0815   ALT 28 06/01/2017 0756   ALKPHOS 43 05/09/2022 0815   ALKPHOS 52 06/01/2017 0756   BILITOT 1.3 (H) 05/09/2022 0815   BILITOT 0.58 06/01/2017 0756  GFRNONAA >60 05/09/2022 0815   GFRAA 58 (L) 10/07/2018 1237   GFRAA 60 (L) 05/04/2018 0922    No results found for: "SPEP", "UPEP"  Lab Results  Component Value Date   WBC 5.3 06/16/2023   NEUTROABS 2.6 06/16/2023   HGB 15.3 (H) 06/16/2023   HCT 45.8 06/16/2023   MCV 87.9 06/16/2023   PLT 151 06/16/2023      Chemistry      Component Value Date/Time   NA 145 05/09/2022 0815   NA 142 06/01/2017 0756   K 4.0 05/09/2022 0815   K 4.2 06/01/2017 0756   CL 111 05/09/2022 0815   CL 109 (H) 01/20/2013 0801   CO2 27 05/09/2022 0815   CO2 21 (L) 06/01/2017 0756   BUN 19 05/09/2022 0815   BUN 14.5 06/01/2017 0756   CREATININE 0.88 05/09/2022 0815   CREATININE 1.0 06/01/2017 0756   GLU 92 06/17/2013 0000      Component Value  Date/Time   CALCIUM 9.8 05/09/2022 0815   CALCIUM 10.1 06/01/2017 0756   ALKPHOS 43 05/09/2022 0815   ALKPHOS 52 06/01/2017 0756   AST 37 05/09/2022 0815   AST 35 (H) 06/01/2017 0756   ALT 39 05/09/2022 0815   ALT 28 06/01/2017 0756   BILITOT 1.3 (H) 05/09/2022 0815   BILITOT 0.58 06/01/2017 0756

## 2023-07-13 ENCOUNTER — Inpatient Hospital Stay: Payer: Medicare Other | Attending: Hematology and Oncology

## 2023-07-13 ENCOUNTER — Other Ambulatory Visit: Payer: Self-pay

## 2023-07-13 VITALS — BP 148/76 | HR 68 | Temp 98.3°F | Resp 18 | Wt 185.0 lb

## 2023-07-13 DIAGNOSIS — D801 Nonfamilial hypogammaglobulinemia: Secondary | ICD-10-CM | POA: Insufficient documentation

## 2023-07-13 MED ORDER — METHYLPREDNISOLONE SODIUM SUCC 40 MG IJ SOLR
40.0000 mg | Freq: Once | INTRAMUSCULAR | Status: AC
  Administered 2023-07-13: 40 mg via INTRAVENOUS
  Filled 2023-07-13: qty 1

## 2023-07-13 MED ORDER — ACETAMINOPHEN 325 MG PO TABS
650.0000 mg | ORAL_TABLET | Freq: Once | ORAL | Status: AC
  Administered 2023-07-13: 650 mg via ORAL
  Filled 2023-07-13: qty 2

## 2023-07-13 MED ORDER — IMMUNE GLOBULIN (HUMAN) 10 GM/100ML IV SOLN
1.0000 g/kg | Freq: Once | INTRAVENOUS | Status: AC
  Administered 2023-07-13: 40 g via INTRAVENOUS
  Filled 2023-07-13: qty 400

## 2023-07-13 MED ORDER — DEXTROSE 5 % IV SOLN: INTRAVENOUS | Status: DC

## 2023-07-13 MED ORDER — LORATADINE 10 MG PO TABS
10.0000 mg | ORAL_TABLET | Freq: Once | ORAL | Status: AC
  Administered 2023-07-13: 10 mg via ORAL
  Filled 2023-07-13: qty 1

## 2023-07-20 ENCOUNTER — Inpatient Hospital Stay: Payer: Medicare Other

## 2023-08-10 ENCOUNTER — Inpatient Hospital Stay: Payer: Medicare Other | Attending: Hematology and Oncology

## 2023-08-10 VITALS — BP 134/84 | HR 74 | Temp 97.7°F | Resp 16 | Wt 178.8 lb

## 2023-08-10 DIAGNOSIS — D801 Nonfamilial hypogammaglobulinemia: Secondary | ICD-10-CM | POA: Insufficient documentation

## 2023-08-10 MED ORDER — IMMUNE GLOBULIN (HUMAN) 10 GM/100ML IV SOLN
1.0000 g/kg | Freq: Once | INTRAVENOUS | Status: AC
  Administered 2023-08-10: 80 g via INTRAVENOUS
  Filled 2023-08-10: qty 800

## 2023-08-10 MED ORDER — LORATADINE 10 MG PO TABS
10.0000 mg | ORAL_TABLET | Freq: Once | ORAL | Status: AC
Start: 2023-08-10 — End: 2023-08-10
  Administered 2023-08-10: 10 mg via ORAL
  Filled 2023-08-10: qty 1

## 2023-08-10 MED ORDER — METHYLPREDNISOLONE SODIUM SUCC 40 MG IJ SOLR
40.0000 mg | Freq: Once | INTRAMUSCULAR | Status: AC
  Administered 2023-08-10: 40 mg via INTRAVENOUS
  Filled 2023-08-10: qty 1

## 2023-08-10 MED ORDER — ACETAMINOPHEN 325 MG PO TABS
650.0000 mg | ORAL_TABLET | Freq: Once | ORAL | Status: AC
  Administered 2023-08-10: 650 mg via ORAL
  Filled 2023-08-10: qty 2

## 2023-08-10 NOTE — Patient Instructions (Signed)

## 2023-09-07 ENCOUNTER — Inpatient Hospital Stay: Payer: Medicare Other | Attending: Hematology and Oncology

## 2023-09-07 VITALS — BP 153/79 | HR 68 | Temp 97.7°F | Resp 18 | Wt 176.5 lb

## 2023-09-07 DIAGNOSIS — D801 Nonfamilial hypogammaglobulinemia: Secondary | ICD-10-CM | POA: Insufficient documentation

## 2023-09-07 MED ORDER — LORATADINE 10 MG PO TABS
10.0000 mg | ORAL_TABLET | Freq: Once | ORAL | Status: AC
  Administered 2023-09-07: 10 mg via ORAL
  Filled 2023-09-07: qty 1

## 2023-09-07 MED ORDER — DEXTROSE 5 % IV SOLN: INTRAVENOUS | Status: DC

## 2023-09-07 MED ORDER — METHYLPREDNISOLONE SODIUM SUCC 40 MG IJ SOLR
40.0000 mg | Freq: Once | INTRAMUSCULAR | Status: AC
  Administered 2023-09-07: 40 mg via INTRAVENOUS
  Filled 2023-09-07: qty 1

## 2023-09-07 MED ORDER — IMMUNE GLOBULIN (HUMAN) 10 GM/100ML IV SOLN
1.0000 g/kg | Freq: Once | INTRAVENOUS | Status: AC
  Administered 2023-09-07: 80 g via INTRAVENOUS
  Filled 2023-09-07: qty 800

## 2023-09-07 MED ORDER — ACETAMINOPHEN 325 MG PO TABS
650.0000 mg | ORAL_TABLET | Freq: Once | ORAL | Status: AC
  Administered 2023-09-07: 650 mg via ORAL
  Filled 2023-09-07: qty 2

## 2023-09-07 NOTE — Patient Instructions (Signed)

## 2023-09-14 ENCOUNTER — Inpatient Hospital Stay: Payer: Medicare Other

## 2023-10-05 ENCOUNTER — Inpatient Hospital Stay: Payer: Medicare Other | Attending: Hematology and Oncology

## 2023-10-05 VITALS — BP 146/72 | HR 78 | Temp 97.7°F | Wt 168.5 lb

## 2023-10-05 DIAGNOSIS — D801 Nonfamilial hypogammaglobulinemia: Secondary | ICD-10-CM | POA: Insufficient documentation

## 2023-10-05 MED ORDER — ACETAMINOPHEN 325 MG PO TABS
650.0000 mg | ORAL_TABLET | Freq: Once | ORAL | Status: AC
  Administered 2023-10-05: 650 mg via ORAL
  Filled 2023-10-05: qty 2

## 2023-10-05 MED ORDER — SODIUM CHLORIDE 0.9 % IV SOLN: Freq: Once | INTRAVENOUS | Status: DC | PRN

## 2023-10-05 MED ORDER — METHYLPREDNISOLONE SODIUM SUCC 40 MG IJ SOLR
40.0000 mg | Freq: Once | INTRAMUSCULAR | Status: AC
  Administered 2023-10-05: 40 mg via INTRAVENOUS
  Filled 2023-10-05: qty 1

## 2023-10-05 MED ORDER — LORATADINE 10 MG PO TABS
10.0000 mg | ORAL_TABLET | Freq: Once | ORAL | Status: AC
  Administered 2023-10-05: 10 mg via ORAL
  Filled 2023-10-05: qty 1

## 2023-10-05 MED ORDER — IMMUNE GLOBULIN (HUMAN) 10 GM/100ML IV SOLN
80.0000 g | Freq: Once | INTRAVENOUS | Status: AC
  Administered 2023-10-05: 40 g via INTRAVENOUS
  Filled 2023-10-05: qty 800

## 2023-10-05 NOTE — Patient Instructions (Signed)
 Immune Globulin Injection What is this medication? IMMUNE GLOBULIN (im MUNE GLOB yoo lin) treats many immune system conditions. It works by Designer, multimedia extra antibodies. Antibodies are proteins made by the immune system that help protect the body. This medicine may be used for other purposes; ask your health care provider or pharmacist if you have questions. COMMON BRAND NAME(S): ASCENIV, Baygam, BIVIGAM, Carimune, Carimune NF, cutaquig, Cuvitru, Flebogamma, Flebogamma DIF, GamaSTAN, GamaSTAN S/D, Gamimune N, Gammagard, Gammagard S/D, Gammaked, Gammaplex, Gammar-P IV, Gamunex, Gamunex-C, Hizentra, Iveegam, Iveegam EN, Octagam, Panglobulin, Panglobulin NF, panzyga, Polygam S/D, Privigen, Sandoglobulin, Venoglobulin-S, Vigam, Vivaglobulin, Xembify What should I tell my care team before I take this medication? They need to know if you have any of these conditions: Blood clotting disorder Condition where you have excess fluid in your body, such as heart failure or edema Dehydration Diabetes Have had blood clots Heart disease Immune system conditions Kidney disease Low levels of IgA Recent or upcoming vaccine An unusual or allergic reaction to immune globulin, other medications, foods, dyes, or preservatives Pregnant or trying to get pregnant Breastfeeding How should I use this medication? This medication is infused into a vein or under the skin. It is usually given by your care team in a hospital or clinic setting. It may also be given at home. If you get this medication at home, you will be taught how to prepare and give it. Use exactly as directed. Take it as directed on the prescription label at the same time every day. Keep taking it unless your care team tells you to stop. It is important that you put your used needles and syringes in a special sharps container. Do not put them in a trash can. If you do not have a sharps container, call your pharmacist or care team to get one. Talk to  your care team about the use of this medication in children. While it may be given to children for selected conditions, precautions do apply. Overdosage: If you think you have taken too much of this medicine contact a poison control center or emergency room at once. NOTE: This medicine is only for you. Do not share this medicine with others. What if I miss a dose? If you get this medication at the hospital or clinic: It is important not to miss your dose. Call your care team if you are unable to keep an appointment. If you give yourself this medication at home: If you miss a dose, take it as soon as you can. Then continue your normal schedule. If it is almost time for your next dose, take only that dose. Do not take double or extra doses. Call your care team with questions. What may interact with this medication? Live virus vaccines This list may not describe all possible interactions. Give your health care provider a list of all the medicines, herbs, non-prescription drugs, or dietary supplements you use. Also tell them if you smoke, drink alcohol, or use illegal drugs. Some items may interact with your medicine. What should I watch for while using this medication? Your condition will be monitored carefully while you are receiving this medication. Tell your care team if your symptoms do not start to get better or if they get worse. You may need blood work done while you are taking this medication. This medication increases the risk of blood clots. People with heart, blood vessel, or blood clotting conditions are more likely to develop a blood clot. Other risk factors include advanced age, estrogen  use, tobacco use, lack of movement, and being overweight. This medication can decrease the response to a vaccine. If you need to get vaccinated, tell your care team if you have received this medication within the last year. Extra booster doses may be needed. Talk to your care team to see if a different  vaccination schedule is needed. If you have diabetes, you may get a falsely elevated blood sugar reading. Talk to your care team about how to check your blood sugar while taking this medication. What side effects may I notice from receiving this medication? Side effects that you should report to your care team as soon as possible: Allergic reactions--skin rash, itching, hives, swelling of the face, lips, tongue, or throat Blood clot--pain, swelling, or warmth in the leg, shortness of breath, chest pain Fever, neck pain or stiffness, sensitivity to light, headache, nausea, vomiting, confusion, which may be signs of meningitis Hemolytic anemia--unusual weakness or fatigue, dizziness, headache, trouble breathing, dark urine, yellowing skin or eyes Kidney injury--decrease in the amount of urine, swelling of the ankles, hands, or feet Low sodium level--muscle weakness, fatigue, dizziness, headache, confusion Shortness of breath or trouble breathing, cough, unusual weakness or fatigue, blue skin or lips Side effects that usually do not require medical attention (report these to your care team if they continue or are bothersome): Chills Diarrhea Fever Headache Nausea This list may not describe all possible side effects. Call your doctor for medical advice about side effects. You may report side effects to FDA at 1-800-FDA-1088. Where should I keep my medication? Keep out of the reach of children and pets. You will be instructed on how to store this medication. Get rid of any unused medication after the expiration date. To get rid of medications that are no longer needed or have expired: Take the medication to a medication take-back program. Check with your pharmacy or law enforcement to find a location. If you cannot return the medication, ask your pharmacist or care team how to get rid of this medication safely. NOTE: This sheet is a summary. It may not cover all possible information. If you have  questions about this medicine, talk to your doctor, pharmacist, or health care provider.  2024 Elsevier/Gold Standard (2023-07-24 00:00:00)

## 2023-10-05 NOTE — Progress Notes (Signed)
 IV line was changed to D5W 250 mg before Privigen  started

## 2023-10-19 ENCOUNTER — Inpatient Hospital Stay: Payer: Medicare Other

## 2023-10-20 ENCOUNTER — Telehealth: Payer: Self-pay | Admitting: Hematology and Oncology

## 2023-10-20 NOTE — Telephone Encounter (Signed)
 Spoke with patient confirming upcoming appointment changes

## 2023-11-03 ENCOUNTER — Inpatient Hospital Stay: Payer: Medicare Other

## 2023-11-04 ENCOUNTER — Inpatient Hospital Stay: Payer: Medicare Other | Attending: Hematology and Oncology

## 2023-11-04 VITALS — BP 154/71 | HR 66 | Temp 97.8°F | Wt 164.8 lb

## 2023-11-04 DIAGNOSIS — D801 Nonfamilial hypogammaglobulinemia: Secondary | ICD-10-CM | POA: Insufficient documentation

## 2023-11-04 MED ORDER — DEXTROSE 5 % IV SOLN
Freq: Once | INTRAVENOUS | Status: AC
Start: 2023-11-04 — End: 2023-11-04

## 2023-11-04 MED ORDER — ACETAMINOPHEN 325 MG PO TABS
650.0000 mg | ORAL_TABLET | Freq: Once | ORAL | Status: AC
  Administered 2023-11-04: 650 mg via ORAL
  Filled 2023-11-04: qty 2

## 2023-11-04 MED ORDER — IMMUNE GLOBULIN (HUMAN) 10 GM/100ML IV SOLN
80.0000 g | Freq: Once | INTRAVENOUS | Status: AC
  Administered 2023-11-04: 80 g via INTRAVENOUS
  Filled 2023-11-04: qty 800

## 2023-11-04 MED ORDER — LORATADINE 10 MG PO TABS
10.0000 mg | ORAL_TABLET | Freq: Once | ORAL | Status: AC
  Administered 2023-11-04: 10 mg via ORAL
  Filled 2023-11-04: qty 1

## 2023-11-04 MED ORDER — METHYLPREDNISOLONE SODIUM SUCC 40 MG IJ SOLR
40.0000 mg | Freq: Once | INTRAMUSCULAR | Status: AC
  Administered 2023-11-04: 40 mg via INTRAVENOUS
  Filled 2023-11-04: qty 1

## 2023-11-04 NOTE — Progress Notes (Signed)
 Patient declined to remain for 30 min post obs. VSS and ambulated to the lobby unassisted without incident

## 2023-11-06 ENCOUNTER — Telehealth: Payer: Self-pay

## 2023-11-06 NOTE — Telephone Encounter (Signed)
 Called to see how she is doing after getting treatment on 3/12. She is doing well and no complaints. After treatment she may have had a little nausea on the way home but it passed quickly. She will call the office back for questions/concerns.

## 2023-11-16 ENCOUNTER — Inpatient Hospital Stay: Payer: Medicare Other

## 2023-12-01 ENCOUNTER — Inpatient Hospital Stay (HOSPITAL_BASED_OUTPATIENT_CLINIC_OR_DEPARTMENT_OTHER): Payer: Medicare Other | Admitting: Hematology and Oncology

## 2023-12-01 ENCOUNTER — Inpatient Hospital Stay: Payer: Medicare Other

## 2023-12-01 ENCOUNTER — Encounter: Payer: Self-pay | Admitting: Hematology and Oncology

## 2023-12-01 ENCOUNTER — Inpatient Hospital Stay: Payer: Medicare Other | Attending: Hematology and Oncology

## 2023-12-01 VITALS — BP 137/80 | HR 97 | Temp 98.2°F | Resp 18

## 2023-12-01 VITALS — BP 130/68 | HR 66 | Temp 97.9°F | Resp 18 | Ht 63.0 in | Wt 162.6 lb

## 2023-12-01 DIAGNOSIS — C9110 Chronic lymphocytic leukemia of B-cell type not having achieved remission: Secondary | ICD-10-CM | POA: Diagnosis present

## 2023-12-01 DIAGNOSIS — D801 Nonfamilial hypogammaglobulinemia: Secondary | ICD-10-CM

## 2023-12-01 DIAGNOSIS — D696 Thrombocytopenia, unspecified: Secondary | ICD-10-CM

## 2023-12-01 DIAGNOSIS — Z856 Personal history of leukemia: Secondary | ICD-10-CM

## 2023-12-01 LAB — CBC WITH DIFFERENTIAL/PLATELET
Abs Immature Granulocytes: 0.02 10*3/uL (ref 0.00–0.07)
Basophils Absolute: 0 10*3/uL (ref 0.0–0.1)
Basophils Relative: 0 %
Eosinophils Absolute: 0.1 10*3/uL (ref 0.0–0.5)
Eosinophils Relative: 1 %
HCT: 46.3 % — ABNORMAL HIGH (ref 36.0–46.0)
Hemoglobin: 15.4 g/dL — ABNORMAL HIGH (ref 12.0–15.0)
Immature Granulocytes: 0 %
Lymphocytes Relative: 46 %
Lymphs Abs: 2.3 10*3/uL (ref 0.7–4.0)
MCH: 30.1 pg (ref 26.0–34.0)
MCHC: 33.3 g/dL (ref 30.0–36.0)
MCV: 90.4 fL (ref 80.0–100.0)
Monocytes Absolute: 0.4 10*3/uL (ref 0.1–1.0)
Monocytes Relative: 8 %
Neutro Abs: 2.2 10*3/uL (ref 1.7–7.7)
Neutrophils Relative %: 45 %
Platelets: 107 10*3/uL — ABNORMAL LOW (ref 150–400)
RBC: 5.12 MIL/uL — ABNORMAL HIGH (ref 3.87–5.11)
RDW: 14 % (ref 11.5–15.5)
WBC: 5 10*3/uL (ref 4.0–10.5)
nRBC: 0 % (ref 0.0–0.2)

## 2023-12-01 MED ORDER — LORATADINE 10 MG PO TABS
10.0000 mg | ORAL_TABLET | Freq: Once | ORAL | Status: AC
  Administered 2023-12-01: 10 mg via ORAL
  Filled 2023-12-01: qty 1

## 2023-12-01 MED ORDER — IMMUNE GLOBULIN (HUMAN) 10 GM/100ML IV SOLN
80.0000 g | Freq: Once | INTRAVENOUS | Status: AC
  Administered 2023-12-01: 80 g via INTRAVENOUS
  Filled 2023-12-01: qty 400

## 2023-12-01 MED ORDER — METHYLPREDNISOLONE SODIUM SUCC 40 MG IJ SOLR
40.0000 mg | Freq: Once | INTRAMUSCULAR | Status: AC
  Administered 2023-12-01: 40 mg via INTRAVENOUS
  Filled 2023-12-01: qty 1

## 2023-12-01 MED ORDER — ACYCLOVIR 400 MG PO TABS: 400.0000 mg | ORAL_TABLET | Freq: Two times a day (BID) | ORAL | 3 refills | Status: AC

## 2023-12-01 MED ORDER — ACETAMINOPHEN 325 MG PO TABS
650.0000 mg | ORAL_TABLET | Freq: Once | ORAL | Status: AC
  Administered 2023-12-01: 650 mg via ORAL
  Filled 2023-12-01: qty 2

## 2023-12-01 NOTE — Assessment & Plan Note (Addendum)
The cause is unknown, could be due to consumption or treatment related It is mild and there is little change compared from previous platelet count.  She is asymptomatic from the thrombocytopenia. I will observe for now.  

## 2023-12-01 NOTE — Patient Instructions (Signed)
 Immune Globulin Injection What is this medication? IMMUNE GLOBULIN (im MUNE GLOB yoo lin) treats many immune system conditions. It works by Designer, multimedia extra antibodies. Antibodies are proteins made by the immune system that help protect the body. This medicine may be used for other purposes; ask your health care provider or pharmacist if you have questions. COMMON BRAND NAME(S): ASCENIV, Baygam, BIVIGAM, Carimune, Carimune NF, cutaquig, Cuvitru, Flebogamma, Flebogamma DIF, GamaSTAN, GamaSTAN S/D, Gamimune N, Gammagard, Gammagard S/D, Gammaked, Gammaplex, Gammar-P IV, Gamunex, Gamunex-C, Hizentra, Iveegam, Iveegam EN, Octagam, Panglobulin, Panglobulin NF, panzyga, Polygam S/D, Privigen, Sandoglobulin, Venoglobulin-S, Vigam, Vivaglobulin, Xembify What should I tell my care team before I take this medication? They need to know if you have any of these conditions: Blood clotting disorder Condition where you have excess fluid in your body, such as heart failure or edema Dehydration Diabetes Have had blood clots Heart disease Immune system conditions Kidney disease Low levels of IgA Recent or upcoming vaccine An unusual or allergic reaction to immune globulin, other medications, foods, dyes, or preservatives Pregnant or trying to get pregnant Breastfeeding How should I use this medication? This medication is infused into a vein or under the skin. It is usually given by your care team in a hospital or clinic setting. It may also be given at home. If you get this medication at home, you will be taught how to prepare and give it. Use exactly as directed. Take it as directed on the prescription label at the same time every day. Keep taking it unless your care team tells you to stop. It is important that you put your used needles and syringes in a special sharps container. Do not put them in a trash can. If you do not have a sharps container, call your pharmacist or care team to get one. Talk to  your care team about the use of this medication in children. While it may be given to children for selected conditions, precautions do apply. Overdosage: If you think you have taken too much of this medicine contact a poison control center or emergency room at once. NOTE: This medicine is only for you. Do not share this medicine with others. What if I miss a dose? If you get this medication at the hospital or clinic: It is important not to miss your dose. Call your care team if you are unable to keep an appointment. If you give yourself this medication at home: If you miss a dose, take it as soon as you can. Then continue your normal schedule. If it is almost time for your next dose, take only that dose. Do not take double or extra doses. Call your care team with questions. What may interact with this medication? Live virus vaccines This list may not describe all possible interactions. Give your health care provider a list of all the medicines, herbs, non-prescription drugs, or dietary supplements you use. Also tell them if you smoke, drink alcohol, or use illegal drugs. Some items may interact with your medicine. What should I watch for while using this medication? Your condition will be monitored carefully while you are receiving this medication. Tell your care team if your symptoms do not start to get better or if they get worse. You may need blood work done while you are taking this medication. This medication increases the risk of blood clots. People with heart, blood vessel, or blood clotting conditions are more likely to develop a blood clot. Other risk factors include advanced age, estrogen  use, tobacco use, lack of movement, and being overweight. This medication can decrease the response to a vaccine. If you need to get vaccinated, tell your care team if you have received this medication within the last year. Extra booster doses may be needed. Talk to your care team to see if a different  vaccination schedule is needed. If you have diabetes, you may get a falsely elevated blood sugar reading. Talk to your care team about how to check your blood sugar while taking this medication. What side effects may I notice from receiving this medication? Side effects that you should report to your care team as soon as possible: Allergic reactions--skin rash, itching, hives, swelling of the face, lips, tongue, or throat Blood clot--pain, swelling, or warmth in the leg, shortness of breath, chest pain Fever, neck pain or stiffness, sensitivity to light, headache, nausea, vomiting, confusion, which may be signs of meningitis Hemolytic anemia--unusual weakness or fatigue, dizziness, headache, trouble breathing, dark urine, yellowing skin or eyes Kidney injury--decrease in the amount of urine, swelling of the ankles, hands, or feet Low sodium level--muscle weakness, fatigue, dizziness, headache, confusion Shortness of breath or trouble breathing, cough, unusual weakness or fatigue, blue skin or lips Side effects that usually do not require medical attention (report these to your care team if they continue or are bothersome): Chills Diarrhea Fever Headache Nausea This list may not describe all possible side effects. Call your doctor for medical advice about side effects. You may report side effects to FDA at 1-800-FDA-1088. Where should I keep my medication? Keep out of the reach of children and pets. You will be instructed on how to store this medication. Get rid of any unused medication after the expiration date. To get rid of medications that are no longer needed or have expired: Take the medication to a medication take-back program. Check with your pharmacy or law enforcement to find a location. If you cannot return the medication, ask your pharmacist or care team how to get rid of this medication safely. NOTE: This sheet is a summary. It may not cover all possible information. If you have  questions about this medicine, talk to your doctor, pharmacist, or health care provider.  2024 Elsevier/Gold Standard (2023-07-24 00:00:00)

## 2023-12-01 NOTE — Progress Notes (Signed)
 [name]  Patient Care Team: Tisovec, Adelfa Koh, MD as PCP - General (Internal Medicine) Waymon Budge, MD as Referring Physician (Pulmonary Disease) Drema Halon, MD (Inactive) as Referring Physician (Otolaryngology) Rachael Fee, MD (Inactive) (Gastroenterology) Richardean Chimera, MD (Obstetrics and Gynecology) Artis Delay, MD as Consulting Physician (Hematology and Oncology) Elmon Else, MD as Consulting Physician (Dermatology) Janet Berlin, MD as Consulting Physician (Ophthalmology)  Assessment & Plan Personal history of CLL (chronic lymphocytic leukemia) She has no signs or symptoms of CLL recurrence We will continue close monitoring She does not need routine surveillance imaging Hypogammaglobulinemia, acquired [facility]) She has acquired hypogammaglobulinemia due to CLL and prior treatment This is a lifelong problem She believes she has benefited from IVIG infusion and did not suffer from recent recurrent infections She will return here once a month and I will see her back in 6 months for further follow-up Thrombocytopenia (HCC) The cause is unknown, could be due to consumption or treatment related It is mild and there is little change compared from previous platelet count.  She is asymptomatic from the thrombocytopenia. I will observe for now.   No orders of the defined types were placed in this encounter.    Artis Delay, MD  INTERVAL HISTORY: she returns for treatment follow-up Since last time I saw her, she had only 1 minor infection She felt the IVIG is protecting her against risk of recurrent infection No recent bleeding Denies infusion reactions She expressed desire to continue treatment once a month indefinitely She has questions related to future travel and vaccinations We reviewed CBC results and discussed plan of care  PHYSICAL EXAMINATION: ECOG PERFORMANCE STATUS: 0 - Asymptomatic  Vitals:   12/01/23 0815   BP: 130/68  Pulse: 66  Resp: 18  Temp: 97.9 F (36.6 C)  SpO2: 97%   Filed Weights   12/01/23 0815  Weight: 162 lb 9.6 oz (73.8 kg)    Relevant data reviewed during this visit included CBC

## 2023-12-01 NOTE — Assessment & Plan Note (Addendum)
She has acquired hypogammaglobulinemia due to CLL and prior treatment This is a lifelong problem She believes she has benefited from IVIG infusion and did not suffer from recent recurrent infections She will return here once a month and I will see her back in 6 months for further follow-up

## 2023-12-01 NOTE — Assessment & Plan Note (Addendum)
She has no signs or symptoms of CLL recurrence We will continue close monitoring She does not need routine surveillance imaging 

## 2023-12-15 ENCOUNTER — Inpatient Hospital Stay: Payer: Medicare Other

## 2023-12-15 ENCOUNTER — Inpatient Hospital Stay: Payer: Medicare Other | Admitting: Hematology and Oncology

## 2023-12-22 ENCOUNTER — Other Ambulatory Visit: Payer: Self-pay | Admitting: Internal Medicine

## 2023-12-22 DIAGNOSIS — Z1231 Encounter for screening mammogram for malignant neoplasm of breast: Secondary | ICD-10-CM

## 2023-12-28 ENCOUNTER — Inpatient Hospital Stay: Attending: Hematology and Oncology

## 2023-12-28 VITALS — BP 139/64 | HR 67 | Temp 98.0°F | Resp 18 | Wt 159.0 lb

## 2023-12-28 DIAGNOSIS — D801 Nonfamilial hypogammaglobulinemia: Secondary | ICD-10-CM | POA: Insufficient documentation

## 2023-12-28 MED ORDER — ACETAMINOPHEN 325 MG PO TABS
650.0000 mg | ORAL_TABLET | Freq: Once | ORAL | Status: AC
  Administered 2023-12-28: 650 mg via ORAL
  Filled 2023-12-28: qty 2

## 2023-12-28 MED ORDER — METHYLPREDNISOLONE SODIUM SUCC 40 MG IJ SOLR
40.0000 mg | Freq: Once | INTRAMUSCULAR | Status: AC
  Administered 2023-12-28: 40 mg via INTRAVENOUS
  Filled 2023-12-28: qty 1

## 2023-12-28 MED ORDER — LORATADINE 10 MG PO TABS
10.0000 mg | ORAL_TABLET | Freq: Once | ORAL | Status: AC
  Administered 2023-12-28: 10 mg via ORAL
  Filled 2023-12-28: qty 1

## 2023-12-28 MED ORDER — IMMUNE GLOBULIN (HUMAN) 10 GM/100ML IV SOLN
80.0000 g | Freq: Once | INTRAVENOUS | Status: AC
  Administered 2023-12-28: 80 g via INTRAVENOUS
  Filled 2023-12-28: qty 800

## 2023-12-28 NOTE — Patient Instructions (Signed)
 [facility] - A DEPT OF [place]  Discharge Instructions: Thank you for choosing [facility] to provide your oncology and hematology care.   If you have a lab appointment with the Cancer Center, please go directly to the Cancer Center and check in at the registration area.   Wear comfortable clothing and clothing appropriate for easy access to any Portacath or PICC line.   We strive to give you quality time with your provider. You may need to reschedule your appointment if you arrive late (15 or more minutes).  Arriving late affects you and other patients whose appointments are after yours.  Also, if you miss three or more appointments without notifying the office, you may be dismissed from the clinic at the provider's discretion.      For prescription refill requests, have your pharmacy contact our office and allow 72 hours for refills to be completed.    Today you received the following chemotherapy and/or immunotherapy agents IVIG Privigen       To help prevent nausea and vomiting after your treatment, we encourage you to take your nausea medication as directed.  BELOW ARE SYMPTOMS THAT SHOULD BE REPORTED IMMEDIATELY: *FEVER GREATER THAN 100.4 F (38 C) OR HIGHER *CHILLS OR SWEATING *NAUSEA AND VOMITING THAT IS NOT CONTROLLED WITH YOUR NAUSEA MEDICATION *UNUSUAL SHORTNESS OF BREATH *UNUSUAL BRUISING OR BLEEDING *URINARY PROBLEMS (pain or burning when urinating, or frequent urination) *BOWEL PROBLEMS (unusual diarrhea, constipation, pain near the anus) TENDERNESS IN MOUTH AND THROAT WITH OR WITHOUT PRESENCE OF ULCERS (sore throat, sores in mouth, or a toothache) UNUSUAL RASH, SWELLING OR PAIN  UNUSUAL VAGINAL DISCHARGE OR ITCHING   Items with * indicate a potential emergency and should be followed up as soon as possible or go to the Emergency Department if any problems should occur.  Please show the CHEMOTHERAPY ALERT CARD or  IMMUNOTHERAPY ALERT CARD at check-in to the Emergency Department and triage nurse.  Should you have questions after your visit or need to cancel or reschedule your appointment, please contact [facility] - A DEPT OF [facility]  Dept: [phone]  and follow the prompts.  Office hours are 8:00 a.m. to 4:30 p.m. Monday - Friday. Please note that voicemails left after 4:00 p.m. may not be returned until the following business day.  We are closed weekends and major holidays. You have access to a nurse at all times for urgent questions. Please call the main number to the clinic Dept: [phone] and follow the prompts.   For any non-urgent questions, you may also contact your provider using MyChart. We now offer e-Visits for anyone 31 and older to request care online for non-urgent symptoms. For details visit [email or web address].   Also download the MyChart app! Go to the app store, search "MyChart", open the app, select [facility], and log in with your MyChart username and password.

## 2024-01-26 ENCOUNTER — Inpatient Hospital Stay: Attending: Hematology and Oncology

## 2024-01-26 VITALS — BP 134/72 | HR 71 | Temp 98.3°F | Resp 18 | Wt 161.8 lb

## 2024-01-26 DIAGNOSIS — D801 Nonfamilial hypogammaglobulinemia: Secondary | ICD-10-CM | POA: Insufficient documentation

## 2024-01-26 MED ORDER — LORATADINE 10 MG PO TABS
10.0000 mg | ORAL_TABLET | Freq: Once | ORAL | Status: AC
  Administered 2024-01-26: 10 mg via ORAL
  Filled 2024-01-26: qty 1

## 2024-01-26 MED ORDER — METHYLPREDNISOLONE SODIUM SUCC 40 MG IJ SOLR
40.0000 mg | Freq: Once | INTRAMUSCULAR | Status: AC
  Administered 2024-01-26: 40 mg via INTRAVENOUS
  Filled 2024-01-26: qty 1

## 2024-01-26 MED ORDER — ACETAMINOPHEN 325 MG PO TABS
650.0000 mg | ORAL_TABLET | Freq: Once | ORAL | Status: AC
  Administered 2024-01-26: 650 mg via ORAL
  Filled 2024-01-26: qty 2

## 2024-01-26 MED ORDER — IMMUNE GLOBULIN (HUMAN) 10 GM/100ML IV SOLN
80.0000 g | Freq: Once | INTRAVENOUS | Status: AC
  Administered 2024-01-26: 80 g via INTRAVENOUS
  Filled 2024-01-26: qty 800

## 2024-01-26 NOTE — Patient Instructions (Signed)
 [facility] - A DEPT OF [place]  Discharge Instructions: Thank you for choosing [facility] to provide your oncology and hematology care.   If you have a lab appointment with the Cancer Center, please go directly to the Cancer Center and check in at the registration area.   Wear comfortable clothing and clothing appropriate for easy access to any Portacath or PICC line.   We strive to give you quality time with your provider. You may need to reschedule your appointment if you arrive late (15 or more minutes).  Arriving late affects you and other patients whose appointments are after yours.  Also, if you miss three or more appointments without notifying the office, you may be dismissed from the clinic at the provider's discretion.      For prescription refill requests, have your pharmacy contact our office and allow 72 hours for refills to be completed.    Today you received the following chemotherapy and/or immunotherapy agents: Privigen .       To help prevent nausea and vomiting after your treatment, we encourage you to take your nausea medication as directed.  BELOW ARE SYMPTOMS THAT SHOULD BE REPORTED IMMEDIATELY: *FEVER GREATER THAN 100.4 F (38 C) OR HIGHER *CHILLS OR SWEATING *NAUSEA AND VOMITING THAT IS NOT CONTROLLED WITH YOUR NAUSEA MEDICATION *UNUSUAL SHORTNESS OF BREATH *UNUSUAL BRUISING OR BLEEDING *URINARY PROBLEMS (pain or burning when urinating, or frequent urination) *BOWEL PROBLEMS (unusual diarrhea, constipation, pain near the anus) TENDERNESS IN MOUTH AND THROAT WITH OR WITHOUT PRESENCE OF ULCERS (sore throat, sores in mouth, or a toothache) UNUSUAL RASH, SWELLING OR PAIN  UNUSUAL VAGINAL DISCHARGE OR ITCHING   Items with * indicate a potential emergency and should be followed up as soon as possible or go to the Emergency Department if any problems should occur.  Please show the CHEMOTHERAPY ALERT CARD or IMMUNOTHERAPY  ALERT CARD at check-in to the Emergency Department and triage nurse.  Should you have questions after your visit or need to cancel or reschedule your appointment, please contact [facility] - A DEPT OF [facility]  Dept: [phone]  and follow the prompts.  Office hours are 8:00 a.m. to 4:30 p.m. Monday - Friday. Please note that voicemails left after 4:00 p.m. may not be returned until the following business day.  We are closed weekends and major holidays. You have access to a nurse at all times for urgent questions. Please call the main number to the clinic Dept: [phone] and follow the prompts.   For any non-urgent questions, you may also contact your provider using MyChart. We now offer e-Visits for anyone 51 and older to request care online for non-urgent symptoms. For details visit [email or web address].   Also download the MyChart app! Go to the app store, search "MyChart", open the app, select [facility], and log in with your MyChart username and password.

## 2024-02-08 ENCOUNTER — Ambulatory Visit
Admission: RE | Admit: 2024-02-08 | Discharge: 2024-02-08 | Disposition: A | Discharge status: Home / Self Care | Source: Ambulatory Visit | Attending: Internal Medicine | Admitting: Internal Medicine

## 2024-02-08 DIAGNOSIS — Z1231 Encounter for screening mammogram for malignant neoplasm of breast: Secondary | ICD-10-CM

## 2024-02-16 ENCOUNTER — Encounter: Payer: Self-pay | Admitting: Hematology and Oncology

## 2024-02-23 ENCOUNTER — Inpatient Hospital Stay: Attending: Hematology and Oncology

## 2024-02-23 VITALS — BP 133/66 | HR 76 | Temp 98.0°F | Resp 18 | Wt 168.8 lb

## 2024-02-23 DIAGNOSIS — D801 Nonfamilial hypogammaglobulinemia: Secondary | ICD-10-CM | POA: Diagnosis present

## 2024-02-23 MED ORDER — METHYLPREDNISOLONE SODIUM SUCC 40 MG IJ SOLR
40.0000 mg | Freq: Once | INTRAMUSCULAR | Status: AC
  Administered 2024-02-23: 40 mg via INTRAVENOUS
  Filled 2024-02-23: qty 1

## 2024-02-23 MED ORDER — ACETAMINOPHEN 325 MG PO TABS
650.0000 mg | ORAL_TABLET | Freq: Once | ORAL | Status: AC
  Administered 2024-02-23: 650 mg via ORAL
  Filled 2024-02-23: qty 2

## 2024-02-23 MED ORDER — IMMUNE GLOBULIN (HUMAN) 10 GM/100ML IV SOLN
80.0000 g | Freq: Once | INTRAVENOUS | Status: AC
  Administered 2024-02-23: 80 g via INTRAVENOUS
  Filled 2024-02-23: qty 800

## 2024-02-23 MED ORDER — LORATADINE 10 MG PO TABS
10.0000 mg | ORAL_TABLET | Freq: Once | ORAL | Status: AC
  Administered 2024-02-23: 10 mg via ORAL
  Filled 2024-02-23: qty 1

## 2024-02-23 NOTE — Patient Instructions (Signed)
 [facility] - A DEPT OF [place]  Discharge Instructions: Thank you for choosing [facility] to provide your oncology and hematology care.   If you have a lab appointment with the Cancer Center, please go directly to the Cancer Center and check in at the registration area.   Wear comfortable clothing and clothing appropriate for easy access to any Portacath or PICC line.   We strive to give you quality time with your provider. You may need to reschedule your appointment if you arrive late (15 or more minutes).  Arriving late affects you and other patients whose appointments are after yours.  Also, if you miss three or more appointments without notifying the office, you may be dismissed from the clinic at the provider's discretion.      For prescription refill requests, have your pharmacy contact our office and allow 72 hours for refills to be completed.    Today you received the following chemotherapy and/or immunotherapy agents: Privigen .       To help prevent nausea and vomiting after your treatment, we encourage you to take your nausea medication as directed.  BELOW ARE SYMPTOMS THAT SHOULD BE REPORTED IMMEDIATELY: *FEVER GREATER THAN 100.4 F (38 C) OR HIGHER *CHILLS OR SWEATING *NAUSEA AND VOMITING THAT IS NOT CONTROLLED WITH YOUR NAUSEA MEDICATION *UNUSUAL SHORTNESS OF BREATH *UNUSUAL BRUISING OR BLEEDING *URINARY PROBLEMS (pain or burning when urinating, or frequent urination) *BOWEL PROBLEMS (unusual diarrhea, constipation, pain near the anus) TENDERNESS IN MOUTH AND THROAT WITH OR WITHOUT PRESENCE OF ULCERS (sore throat, sores in mouth, or a toothache) UNUSUAL RASH, SWELLING OR PAIN  UNUSUAL VAGINAL DISCHARGE OR ITCHING   Items with * indicate a potential emergency and should be followed up as soon as possible or go to the Emergency Department if any problems should occur.  Please show the CHEMOTHERAPY ALERT CARD or IMMUNOTHERAPY  ALERT CARD at check-in to the Emergency Department and triage nurse.  Should you have questions after your visit or need to cancel or reschedule your appointment, please contact [facility] - A DEPT OF [facility]  Dept: [phone]  and follow the prompts.  Office hours are 8:00 a.m. to 4:30 p.m. Monday - Friday. Please note that voicemails left after 4:00 p.m. may not be returned until the following business day.  We are closed weekends and major holidays. You have access to a nurse at all times for urgent questions. Please call the main number to the clinic Dept: [phone] and follow the prompts.   For any non-urgent questions, you may also contact your provider using MyChart. We now offer e-Visits for anyone 51 and older to request care online for non-urgent symptoms. For details visit [email or web address].   Also download the MyChart app! Go to the app store, search "MyChart", open the app, select [facility], and log in with your MyChart username and password.

## 2024-02-25 ENCOUNTER — Telehealth: Admitting: Physician Assistant

## 2024-02-25 DIAGNOSIS — H01004 Unspecified blepharitis left upper eyelid: Secondary | ICD-10-CM | POA: Diagnosis not present

## 2024-02-25 MED ORDER — POLYMYXIN B-TRIMETHOPRIM 10000-0.1 UNIT/ML-% OP SOLN: OPHTHALMIC | 0 refills | Status: DC

## 2024-02-25 NOTE — Patient Instructions (Signed)
 Reine C Polasek, thank you for joining [name], PA-C for today's virtual visit.  While this provider is not your primary care provider (PCP), if your PCP is located in our provider database this encounter information will be shared with them immediately following your visit.   [facility] MyChart account gives you access to today's visit and all your visits, tests, and labs performed at [facility]  click here if you don't have a [place] MyChart account or go to [email or web address]  Consent: (Patient) Bianca Parker provided verbal consent for this virtual visit at the beginning of the encounter.  Current Medications:  Current Outpatient Medications:    doxycycline  (VIBRA -TABS) 100 MG tablet, Take by mouth., Disp: , Rfl:    trimethoprim-polymyxin b (POLYTRIM) ophthalmic solution, Apply 1-2 drops into affected eye QID x 5 days., Disp: 10 mL, Rfl: 0   acyclovir  (ZOVIRAX ) 400 MG tablet, Take 1 tablet (400 mg total) by mouth 2 (two) times daily., Disp: 180 tablet, Rfl: 3   atorvastatin  (LIPITOR) 10 MG tablet, Take 10 mg by mouth daily., Disp: , Rfl:    Calcium  Carbonate-Vitamin D (CALTRATE 600+D) 600-400 MG-UNIT per tablet, Take 1 tablet by mouth daily., Disp: , Rfl:    diphenhydrAMINE  (BENADRYL ) 25 mg capsule, Take 25 mg by mouth every 6 (six) hours as needed. Takes after IVIG infusions, Disp: , Rfl:    fexofenadine (ALLEGRA) 180 MG tablet, Take 180 mg by mouth daily., Disp: , Rfl:    ibuprofen (ADVIL) 200 MG tablet, Take 200 mg by mouth every 6 (six) hours as needed. Takes after IVIG infusions, Disp: , Rfl:    levothyroxine (SYNTHROID, LEVOTHROID) 50 MCG tablet, Take 50 mcg by mouth daily before breakfast. , Disp: , Rfl:    Multiple Vitamins-Minerals (PRESERVISION AREDS PO), Take by mouth daily., Disp: , Rfl:    mupirocin ointment (BACTROBAN) 2 %, Apply 1 application topically as needed. , Disp: , Rfl:    Medications ordered in this encounter:  Meds  ordered this encounter  Medications   trimethoprim-polymyxin b (POLYTRIM) ophthalmic solution    Sig: Apply 1-2 drops into affected eye QID x 5 days.    Dispense:  10 mL    Refill:  0    Supervising Provider:   BLAISE ALEENE KIDD [[id number]]     *If you need refills on other medications prior to your next appointment, please contact your pharmacy*  Follow-Up: Call back or seek an in-person evaluation if the symptoms worsen or if the condition fails to improve as anticipated.  [facility] [phone]  Other Instructions Keep skin clean and dry. Continue warm compresses. Apply the drops as directed. Follow-up with your eye specialist next week as scheduled. If you note any non-resolving, new, or worsening symptoms despite treatment, please seek an in-person evaluation ASAP.    If you have been instructed to have an in-person evaluation today at a local Urgent Care facility, please use the link below. It will take you to a list of all of our available [facility], including address, phone number and hours of operation. Please do not delay care.  [facility]  If you or a family member do not have a primary care provider, use the link below to schedule a visit and establish care. When you choose a  primary care physician or advanced practice provider, you gain a long-term partner in health. Find a Primary Care Provider  Learn more about [facility]'s in-office and virtual care options: [place] - Get Care Now

## 2024-02-25 NOTE — Progress Notes (Signed)
 Virtual Visit Consent   Bianca Parker, you are scheduled for a virtual visit with a [facility] provider today. Just as with appointments in the office, your consent must be obtained to participate. Your consent will be active for this visit and any virtual visit you may have with one of our providers in the next 365 days. If you have a MyChart account, a copy of this consent can be sent to you electronically.  As this is a virtual visit, video technology does not allow for your provider to perform a traditional examination. This may limit your provider's ability to fully assess your condition. If your provider identifies any concerns that need to be evaluated in person or the need to arrange testing (such as labs, EKG, etc.), we will make arrangements to do so. Although advances in technology are sophisticated, we cannot ensure that it will always work on either your end or our end. If the connection with a video visit is poor, the visit may have to be switched to a telephone visit. With either a video or telephone visit, we are not always able to ensure that we have a secure connection.  By engaging in this virtual visit, you consent to the provision of healthcare and authorize for your insurance to be billed (if applicable) for the services provided during this visit. Depending on your insurance coverage, you may receive a charge related to this service.  I need to obtain your verbal consent now. Are you willing to proceed with your visit today? Bianca Parker has provided verbal consent on 02/25/2024 for a virtual visit (video or telephone). Bianca Parker, [place]  Date: 02/25/2024 6:15 PM   Virtual Visit via Video Note   I, Bianca Parker, connected with  Bianca Parker  ([id number], 1945/11/30) on 02/25/24 at  7:00 PM EDT by a video-enabled telemedicine application and verified that I am speaking with the correct person using two identifiers.  Location: Patient: Virtual Visit  Location Patient: Home Provider: Virtual Visit Location Provider: Home Office   I discussed the limitations of evaluation and management by telemedicine and the availability of in person appointments. The patient expressed understanding and agreed to proceed.    History of Present Illness: Bianca Parker is a 78 y.o. who identifies as a female who was assigned female at birth, and is being seen today for swelling of L upper eyelid with tenderness.  Notes she first noticed this yesterday.  Was mild at the time.  Started warm compresses.  Denies any drainage from the area.  Denies any redness of conjunctivo or change to vision. Is getting over a dental infection for which she is taking Doxycycline  100 mg twice daily due to penicillin allergy .  Had follow-up with her endodontist yesterday due to the eye swelling.  States she was told these are not related.  This evening with increased swelling and tenderness.  Wanted to get evaluated as she is leaving the state tomorrow morning to go to [place]  for the weekend.   HPI: HPI  Problems:  Patient Active Problem List   Diagnosis Date Noted   Squamous cell carcinoma in situ (SCCIS) of skin of calf 05/09/2022   Goals of care, counseling/discussion 11/08/2019   Preventive measure 05/09/2019   Diverticulitis 06/01/2017   Exercise-induced tachycardia 07/25/2016   Pre-syncope 07/25/2016   Thrombocytopenia (HCC) 12/17/2015   Rectal bleeding 07/24/2015   Enlarged lymph nodes 04/29/2015   Cellulitis 04/16/2015   Excessive weight gain 03/07/2015  Hyperlipidemia 06/07/2014   Acute bronchitis 07/17/2012   Hepatitis A antibody positive 04/06/2012   Hepatitis B antibody positive 04/06/2012   Diarrhea 02/16/2012   Cyst of right kidney 01/09/2012   LFT elevation    Hypogammaglobulinemia, acquired (HCC) 11/18/2011   Dyslipidemia 05/22/2011   RHINOSINUSITIS, CHRONIC 11/01/2010   Drug allergy  11/01/2010   History of skin cancer 02/20/2010   Personal  history of CLL (chronic lymphocytic leukemia) 02/20/2010   ANEMIA-NOS 02/20/2010   Seasonal and perennial allergic rhinitis 02/20/2010    Allergies:  Allergies  Allergen Reactions   Penicillins    Pentamidine Other (See Comments)    Respiratory distress   Sulfonamide Derivatives    Medications:  Current Outpatient Medications:    doxycycline  (VIBRA -TABS) 100 MG tablet, Take by mouth., Disp: , Rfl:    trimethoprim-polymyxin b (POLYTRIM) ophthalmic solution, Apply 1-2 drops into affected eye QID x 5 days., Disp: 10 mL, Rfl: 0   acyclovir  (ZOVIRAX ) 400 MG tablet, Take 1 tablet (400 mg total) by mouth 2 (two) times daily., Disp: 180 tablet, Rfl: 3   atorvastatin  (LIPITOR) 10 MG tablet, Take 10 mg by mouth daily., Disp: , Rfl:    Calcium  Carbonate-Vitamin D (CALTRATE 600+D) 600-400 MG-UNIT per tablet, Take 1 tablet by mouth daily., Disp: , Rfl:    diphenhydrAMINE  (BENADRYL ) 25 mg capsule, Take 25 mg by mouth every 6 (six) hours as needed. Takes after IVIG infusions, Disp: , Rfl:    fexofenadine (ALLEGRA) 180 MG tablet, Take 180 mg by mouth daily., Disp: , Rfl:    ibuprofen (ADVIL) 200 MG tablet, Take 200 mg by mouth every 6 (six) hours as needed. Takes after IVIG infusions, Disp: , Rfl:    levothyroxine (SYNTHROID, LEVOTHROID) 50 MCG tablet, Take 50 mcg by mouth daily before breakfast. , Disp: , Rfl:    Multiple Vitamins-Minerals (PRESERVISION AREDS PO), Take by mouth daily., Disp: , Rfl:    mupirocin ointment (BACTROBAN) 2 %, Apply 1 application topically as needed. , Disp: , Rfl:   Observations/Objective: Patient is well-developed, well-nourished in no acute distress.  Resting comfortably  at home.  Head is normocephalic, atraumatic.  No labored breathing.  Speech is clear and coherent with logical content.  Patient is alert and oriented at baseline.  Left upper eyelid swelling without visible stye.  No periorbital erythema or edema noted.  Conjunctiva within normal limits.  No noted  atypical findings of right eyes/eyelids.  Assessment and Plan: 1. Blepharitis of left upper eyelid, unspecified type (Primary) - trimethoprim-polymyxin b (POLYTRIM) ophthalmic solution; Apply 1-2 drops into affected eye QID x 5 days.  Dispense: 10 mL; Refill: 0  Supportive measures and OTC medications reviewed.  Continue warm compresses.  Add on Polytrim drops.  Has follow-up already scheduled Monday with her ophthalmologist.  Strict urgent care/ER precautions reviewed.  Follow Up Instructions: I discussed the assessment and treatment plan with the patient. The patient was provided an opportunity to ask questions and all were answered. The patient agreed with the plan and demonstrated an understanding of the instructions.  A copy of instructions were sent to the patient via MyChart unless otherwise noted below.   The patient was advised to call back or seek an in-person evaluation if the symptoms worsen or if the condition fails to improve as anticipated.    Bianca Velma Lunger, PA-C

## 2024-03-22 ENCOUNTER — Inpatient Hospital Stay

## 2024-03-22 VITALS — BP 162/74 | HR 82 | Temp 98.2°F | Resp 16 | Wt 172.5 lb

## 2024-03-22 DIAGNOSIS — D801 Nonfamilial hypogammaglobulinemia: Secondary | ICD-10-CM

## 2024-03-22 MED ORDER — LORATADINE 10 MG PO TABS
10.0000 mg | ORAL_TABLET | Freq: Once | ORAL | Status: AC
  Administered 2024-03-22: 10 mg via ORAL
  Filled 2024-03-22: qty 1

## 2024-03-22 MED ORDER — METHYLPREDNISOLONE SODIUM SUCC 40 MG IJ SOLR
40.0000 mg | Freq: Once | INTRAMUSCULAR | Status: AC
  Administered 2024-03-22: 40 mg via INTRAVENOUS
  Filled 2024-03-22: qty 1

## 2024-03-22 MED ORDER — ACETAMINOPHEN 325 MG PO TABS
650.0000 mg | ORAL_TABLET | Freq: Once | ORAL | Status: AC
  Administered 2024-03-22: 650 mg via ORAL
  Filled 2024-03-22: qty 2

## 2024-03-22 MED ORDER — IMMUNE GLOBULIN (HUMAN) 10 GM/100ML IV SOLN
1.0000 g/kg | Freq: Once | INTRAVENOUS | Status: AC
  Administered 2024-03-22: 80 g via INTRAVENOUS
  Filled 2024-03-22: qty 800

## 2024-03-22 NOTE — Patient Instructions (Signed)
 [facility] - A DEPT OF [place]  Discharge Instructions: Thank you for choosing [facility] to provide your oncology and hematology care.   If you have a lab appointment with the Cancer Center, please go directly to the Cancer Center and check in at the registration area.   Wear comfortable clothing and clothing appropriate for easy access to any Portacath or PICC line.   We strive to give you quality time with your provider. You may need to reschedule your appointment if you arrive late (15 or more minutes).  Arriving late affects you and other patients whose appointments are after yours.  Also, if you miss three or more appointments without notifying the office, you may be dismissed from the clinic at the provider's discretion.      For prescription refill requests, have your pharmacy contact our office and allow 72 hours for refills to be completed.    Today you received the following chemotherapy and/or immunotherapy agents: Privigen .       To help prevent nausea and vomiting after your treatment, we encourage you to take your nausea medication as directed.  BELOW ARE SYMPTOMS THAT SHOULD BE REPORTED IMMEDIATELY: *FEVER GREATER THAN 100.4 F (38 C) OR HIGHER *CHILLS OR SWEATING *NAUSEA AND VOMITING THAT IS NOT CONTROLLED WITH YOUR NAUSEA MEDICATION *UNUSUAL SHORTNESS OF BREATH *UNUSUAL BRUISING OR BLEEDING *URINARY PROBLEMS (pain or burning when urinating, or frequent urination) *BOWEL PROBLEMS (unusual diarrhea, constipation, pain near the anus) TENDERNESS IN MOUTH AND THROAT WITH OR WITHOUT PRESENCE OF ULCERS (sore throat, sores in mouth, or a toothache) UNUSUAL RASH, SWELLING OR PAIN  UNUSUAL VAGINAL DISCHARGE OR ITCHING   Items with * indicate a potential emergency and should be followed up as soon as possible or go to the Emergency Department if any problems should occur.  Please show the CHEMOTHERAPY ALERT CARD or IMMUNOTHERAPY  ALERT CARD at check-in to the Emergency Department and triage nurse.  Should you have questions after your visit or need to cancel or reschedule your appointment, please contact [facility] - A DEPT OF [facility]  Dept: [phone]  and follow the prompts.  Office hours are 8:00 a.m. to 4:30 p.m. Monday - Friday. Please note that voicemails left after 4:00 p.m. may not be returned until the following business day.  We are closed weekends and major holidays. You have access to a nurse at all times for urgent questions. Please call the main number to the clinic Dept: [phone] and follow the prompts.   For any non-urgent questions, you may also contact your provider using MyChart. We now offer e-Visits for anyone 51 and older to request care online for non-urgent symptoms. For details visit [email or web address].   Also download the MyChart app! Go to the app store, search "MyChart", open the app, select [facility], and log in with your MyChart username and password.

## 2024-04-19 ENCOUNTER — Inpatient Hospital Stay: Attending: Hematology and Oncology

## 2024-04-19 VITALS — BP 151/75 | HR 69 | Temp 98.2°F | Resp 16 | Wt 177.5 lb

## 2024-04-19 DIAGNOSIS — D801 Nonfamilial hypogammaglobulinemia: Secondary | ICD-10-CM | POA: Diagnosis present

## 2024-04-19 MED ORDER — LORATADINE 10 MG PO TABS
10.0000 mg | ORAL_TABLET | Freq: Once | ORAL | Status: AC
  Administered 2024-04-19: 10 mg via ORAL
  Filled 2024-04-19: qty 1

## 2024-04-19 MED ORDER — DEXTROSE 5 % IV SOLN: INTRAVENOUS | Status: DC

## 2024-04-19 MED ORDER — METHYLPREDNISOLONE SODIUM SUCC 40 MG IJ SOLR
40.0000 mg | Freq: Once | INTRAMUSCULAR | Status: AC
  Administered 2024-04-19: 40 mg via INTRAVENOUS
  Filled 2024-04-19: qty 1

## 2024-04-19 MED ORDER — ACETAMINOPHEN 325 MG PO TABS
650.0000 mg | ORAL_TABLET | Freq: Once | ORAL | Status: AC
  Administered 2024-04-19: 650 mg via ORAL
  Filled 2024-04-19: qty 2

## 2024-04-19 MED ORDER — IMMUNE GLOBULIN (HUMAN) 10 GM/100ML IV SOLN
1.0000 g/kg | Freq: Once | INTRAVENOUS | Status: AC
  Administered 2024-04-19: 80 g via INTRAVENOUS
  Filled 2024-04-19: qty 800

## 2024-04-19 NOTE — Patient Instructions (Signed)
 [facility] - A DEPT OF [place]  Discharge Instructions: Thank you for choosing [facility] to provide your oncology and hematology care.   If you have a lab appointment with the Cancer Center, please go directly to the Cancer Center and check in at the registration area.   Wear comfortable clothing and clothing appropriate for easy access to any Portacath or PICC line.   We strive to give you quality time with your provider. You may need to reschedule your appointment if you arrive late (15 or more minutes).  Arriving late affects you and other patients whose appointments are after yours.  Also, if you miss three or more appointments without notifying the office, you may be dismissed from the clinic at the provider's discretion.      For prescription refill requests, have your pharmacy contact our office and allow 72 hours for refills to be completed.    Today you received the following chemotherapy and/or immunotherapy agents: Privigen .       To help prevent nausea and vomiting after your treatment, we encourage you to take your nausea medication as directed.  BELOW ARE SYMPTOMS THAT SHOULD BE REPORTED IMMEDIATELY: *FEVER GREATER THAN 100.4 F (38 C) OR HIGHER *CHILLS OR SWEATING *NAUSEA AND VOMITING THAT IS NOT CONTROLLED WITH YOUR NAUSEA MEDICATION *UNUSUAL SHORTNESS OF BREATH *UNUSUAL BRUISING OR BLEEDING *URINARY PROBLEMS (pain or burning when urinating, or frequent urination) *BOWEL PROBLEMS (unusual diarrhea, constipation, pain near the anus) TENDERNESS IN MOUTH AND THROAT WITH OR WITHOUT PRESENCE OF ULCERS (sore throat, sores in mouth, or a toothache) UNUSUAL RASH, SWELLING OR PAIN  UNUSUAL VAGINAL DISCHARGE OR ITCHING   Items with * indicate a potential emergency and should be followed up as soon as possible or go to the Emergency Department if any problems should occur.  Please show the CHEMOTHERAPY ALERT CARD or IMMUNOTHERAPY  ALERT CARD at check-in to the Emergency Department and triage nurse.  Should you have questions after your visit or need to cancel or reschedule your appointment, please contact [facility] - A DEPT OF [facility]  Dept: [phone]  and follow the prompts.  Office hours are 8:00 a.m. to 4:30 p.m. Monday - Friday. Please note that voicemails left after 4:00 p.m. may not be returned until the following business day.  We are closed weekends and major holidays. You have access to a nurse at all times for urgent questions. Please call the main number to the clinic Dept: [phone] and follow the prompts.   For any non-urgent questions, you may also contact your provider using MyChart. We now offer e-Visits for anyone 51 and older to request care online for non-urgent symptoms. For details visit [email or web address].   Also download the MyChart app! Go to the app store, search "MyChart", open the app, select [facility], and log in with your MyChart username and password.

## 2024-04-19 NOTE — Progress Notes (Signed)
 Patient tolerated her treatment well today. Declined full 30 minute observation. Sat a few minutes until her BP came down- it goes up when she walks around. BP (!) 151/75 (BP Location: Right Arm, Patient Position: Sitting)   Pulse 69   Temp 98.2 F (36.8 C) (Oral)   Resp 16   Wt 177 lb 8 oz (80.5 kg)   SpO2 98%   BMI 31.44 kg/m   Ambulatory to the lobby.

## 2024-05-17 ENCOUNTER — Inpatient Hospital Stay: Attending: Hematology and Oncology

## 2024-05-17 VITALS — BP 144/72 | HR 79 | Temp 98.3°F | Resp 16 | Wt 176.2 lb

## 2024-05-17 DIAGNOSIS — D801 Nonfamilial hypogammaglobulinemia: Secondary | ICD-10-CM | POA: Insufficient documentation

## 2024-05-17 MED ORDER — ACETAMINOPHEN 325 MG PO TABS
650.0000 mg | ORAL_TABLET | Freq: Once | ORAL | Status: AC
  Administered 2024-05-17: 650 mg via ORAL
  Filled 2024-05-17: qty 2

## 2024-05-17 MED ORDER — DEXTROSE 5 % IV SOLN: INTRAVENOUS | Status: DC

## 2024-05-17 MED ORDER — METHYLPREDNISOLONE SODIUM SUCC 40 MG IJ SOLR
40.0000 mg | Freq: Once | INTRAMUSCULAR | Status: AC
  Administered 2024-05-17: 40 mg via INTRAVENOUS
  Filled 2024-05-17: qty 1

## 2024-05-17 MED ORDER — LORATADINE 10 MG PO TABS
10.0000 mg | ORAL_TABLET | Freq: Once | ORAL | Status: AC
  Administered 2024-05-17: 10 mg via ORAL
  Filled 2024-05-17: qty 1

## 2024-05-17 MED ORDER — IMMUNE GLOBULIN (HUMAN) 10 GM/100ML IV SOLN
1.0000 g/kg | Freq: Once | INTRAVENOUS | Status: AC
  Administered 2024-05-17: 80 g via INTRAVENOUS
  Filled 2024-05-17: qty 800

## 2024-05-17 NOTE — Patient Instructions (Signed)
 [facility] - A DEPT OF [place]  Discharge Instructions: Thank you for choosing [facility] to provide your oncology and hematology care.   If you have a lab appointment with the Cancer Center, please go directly to the Cancer Center and check in at the registration area.   Wear comfortable clothing and clothing appropriate for easy access to any Portacath or PICC line.   We strive to give you quality time with your provider. You may need to reschedule your appointment if you arrive late (15 or more minutes).  Arriving late affects you and other patients whose appointments are after yours.  Also, if you miss three or more appointments without notifying the office, you may be dismissed from the clinic at the provider's discretion.      For prescription refill requests, have your pharmacy contact our office and allow 72 hours for refills to be completed.    Today you received the following chemotherapy and/or immunotherapy agents: Privigen .       To help prevent nausea and vomiting after your treatment, we encourage you to take your nausea medication as directed.  BELOW ARE SYMPTOMS THAT SHOULD BE REPORTED IMMEDIATELY: *FEVER GREATER THAN 100.4 F (38 C) OR HIGHER *CHILLS OR SWEATING *NAUSEA AND VOMITING THAT IS NOT CONTROLLED WITH YOUR NAUSEA MEDICATION *UNUSUAL SHORTNESS OF BREATH *UNUSUAL BRUISING OR BLEEDING *URINARY PROBLEMS (pain or burning when urinating, or frequent urination) *BOWEL PROBLEMS (unusual diarrhea, constipation, pain near the anus) TENDERNESS IN MOUTH AND THROAT WITH OR WITHOUT PRESENCE OF ULCERS (sore throat, sores in mouth, or a toothache) UNUSUAL RASH, SWELLING OR PAIN  UNUSUAL VAGINAL DISCHARGE OR ITCHING   Items with * indicate a potential emergency and should be followed up as soon as possible or go to the Emergency Department if any problems should occur.  Please show the CHEMOTHERAPY ALERT CARD or IMMUNOTHERAPY  ALERT CARD at check-in to the Emergency Department and triage nurse.  Should you have questions after your visit or need to cancel or reschedule your appointment, please contact [facility] - A DEPT OF [facility]  Dept: [phone]  and follow the prompts.  Office hours are 8:00 a.m. to 4:30 p.m. Monday - Friday. Please note that voicemails left after 4:00 p.m. may not be returned until the following business day.  We are closed weekends and major holidays. You have access to a nurse at all times for urgent questions. Please call the main number to the clinic Dept: [phone] and follow the prompts.   For any non-urgent questions, you may also contact your provider using MyChart. We now offer e-Visits for anyone 51 and older to request care online for non-urgent symptoms. For details visit [email or web address].   Also download the MyChart app! Go to the app store, search "MyChart", open the app, select [facility], and log in with your MyChart username and password.

## 2024-06-14 ENCOUNTER — Inpatient Hospital Stay: Attending: Hematology and Oncology

## 2024-06-14 ENCOUNTER — Inpatient Hospital Stay

## 2024-06-14 ENCOUNTER — Encounter: Payer: Self-pay | Admitting: Hematology and Oncology

## 2024-06-14 ENCOUNTER — Inpatient Hospital Stay: Admitting: Hematology and Oncology

## 2024-06-14 VITALS — BP 160/88 | HR 84 | Temp 98.9°F | Resp 18 | Ht 63.0 in | Wt 183.2 lb

## 2024-06-14 VITALS — BP 154/70 | HR 80 | Temp 97.8°F | Resp 20

## 2024-06-14 DIAGNOSIS — D801 Nonfamilial hypogammaglobulinemia: Secondary | ICD-10-CM

## 2024-06-14 DIAGNOSIS — Z23 Encounter for immunization: Secondary | ICD-10-CM | POA: Insufficient documentation

## 2024-06-14 DIAGNOSIS — Z299 Encounter for prophylactic measures, unspecified: Secondary | ICD-10-CM

## 2024-06-14 DIAGNOSIS — Z856 Personal history of leukemia: Secondary | ICD-10-CM | POA: Diagnosis not present

## 2024-06-14 DIAGNOSIS — D696 Thrombocytopenia, unspecified: Secondary | ICD-10-CM

## 2024-06-14 LAB — CBC WITH DIFFERENTIAL/PLATELET
Abs Immature Granulocytes: 0.01 K/uL (ref 0.00–0.07)
Basophils Absolute: 0 K/uL (ref 0.0–0.1)
Basophils Relative: 1 %
Eosinophils Absolute: 0.1 K/uL (ref 0.0–0.5)
Eosinophils Relative: 2 %
HCT: 43.3 % (ref 36.0–46.0)
Hemoglobin: 14.6 g/dL (ref 12.0–15.0)
Immature Granulocytes: 0 %
Lymphocytes Relative: 47 %
Lymphs Abs: 2.8 K/uL (ref 0.7–4.0)
MCH: 29.9 pg (ref 26.0–34.0)
MCHC: 33.7 g/dL (ref 30.0–36.0)
MCV: 88.5 fL (ref 80.0–100.0)
Monocytes Absolute: 0.4 K/uL (ref 0.1–1.0)
Monocytes Relative: 7 %
Neutro Abs: 2.6 K/uL (ref 1.7–7.7)
Neutrophils Relative %: 43 %
Platelets: 120 K/uL — ABNORMAL LOW (ref 150–400)
RBC: 4.89 MIL/uL (ref 3.87–5.11)
RDW: 13.3 % (ref 11.5–15.5)
WBC: 6 K/uL (ref 4.0–10.5)
nRBC: 0 % (ref 0.0–0.2)

## 2024-06-14 MED ORDER — SODIUM CHLORIDE 0.9 % IV SOLN: Freq: Once | INTRAVENOUS | Status: AC

## 2024-06-14 MED ORDER — IMMUNE GLOBULIN (HUMAN) 10 GM/100ML IV SOLN
80.0000 g | Freq: Once | INTRAVENOUS | Status: AC
  Administered 2024-06-14: 80 g via INTRAVENOUS
  Filled 2024-06-14: qty 800

## 2024-06-14 MED ORDER — DEXTROSE 5 % IV SOLN: INTRAVENOUS | Status: DC

## 2024-06-14 MED ORDER — ACETAMINOPHEN 325 MG PO TABS
650.0000 mg | ORAL_TABLET | Freq: Once | ORAL | Status: AC
  Administered 2024-06-14: 650 mg via ORAL
  Filled 2024-06-14: qty 2

## 2024-06-14 MED ORDER — LORATADINE 10 MG PO TABS
10.0000 mg | ORAL_TABLET | Freq: Once | ORAL | Status: AC
  Administered 2024-06-14: 10 mg via ORAL
  Filled 2024-06-14: qty 1

## 2024-06-14 MED ORDER — METHYLPREDNISOLONE SODIUM SUCC 40 MG IJ SOLR
40.0000 mg | Freq: Once | INTRAMUSCULAR | Status: AC
  Administered 2024-06-14: 40 mg via INTRAVENOUS
  Filled 2024-06-14: qty 1

## 2024-06-14 MED ORDER — INFLUENZA VAC SPLIT HIGH-DOSE 0.5 ML IM SUSY
0.5000 mL | PREFILLED_SYRINGE | Freq: Once | INTRAMUSCULAR | Status: AC
  Administered 2024-06-14: 0.5 mL via INTRAMUSCULAR
  Filled 2024-06-14: qty 0.5

## 2024-06-14 NOTE — Assessment & Plan Note (Addendum)
The cause is unknown, could be due to consumption or treatment related It is mild and there is little change compared from previous platelet count.  She is asymptomatic from the thrombocytopenia. I will observe for now.  

## 2024-06-14 NOTE — Assessment & Plan Note (Addendum)
 We spent some time reviewing vaccination program We discussed the importance of preventive care and reviewed the vaccination programs. She does not have any prior allergic reactions to influenza vaccination. She agrees to proceed with influenza vaccination today and we will administer it today at the clinic.

## 2024-06-14 NOTE — Assessment & Plan Note (Addendum)
 She was in one of the first cohorts of patients treated with FCR on protocol at the [facility] at time of her initial diagnosis of CLL in July 2002. Technically she had RAI  stage III disease due to anemia but she had no organomegaly or lymphadenopathy on exam. FCR was started in 12/2001, and continued through 06/2002. She achieved a complete hematologic response but developed chronic acquired hypogammaglobulinemia. As a result, she gets recurrent bronchitis and sinusitis.    She has no signs or symptoms of CLL recurrence We will continue close monitoring She does not need routine surveillance imaging

## 2024-06-14 NOTE — Assessment & Plan Note (Addendum)
 She has developed acquired hypogammaglobulinemia due to prior treatment She started to receive IVIG treatment in the prophylactic fashion since 2013 She is doing well while on treatment with premedications monthly The plan will be to continue monthly IVIG at 1 g/kg indefinitely I will see her in 6 months for further follow-up

## 2024-06-14 NOTE — Progress Notes (Signed)
 [facility] OFFICE PROGRESS NOTE  Patient Care Team: Tisovec, Charlie ORN, MD as PCP - General (Internal Medicine) Neysa Reggy BIRCH, MD as Referring Physician (Pulmonary Disease) Ethyl Lonni BRAVO, MD (Inactive) as Referring Physician (Otolaryngology) Teressa Toribio SQUIBB, MD (Inactive) (Gastroenterology) Leva Rush, MD (Obstetrics and Gynecology) Lonn Hicks, MD as Consulting Physician (Hematology and Oncology) Robinson Pao, MD as Consulting Physician (Dermatology) Patrcia Sharper, MD as Consulting Physician (Ophthalmology)  Assessment & Plan Personal history of CLL (chronic lymphocytic leukemia) She was in one of the first cohorts of patients treated with FCR on protocol at the [facility] at time of her initial diagnosis of CLL in July 2002. Technically she had RAI  stage III disease due to anemia but she had no organomegaly or lymphadenopathy on exam. FCR was started in 12/2001, and continued through 06/2002. She achieved a complete hematologic response but developed chronic acquired hypogammaglobulinemia. As a result, she gets recurrent bronchitis and sinusitis.    She has no signs or symptoms of CLL recurrence We will continue close monitoring She does not need routine surveillance imaging Hypogammaglobulinemia, acquired She has developed acquired hypogammaglobulinemia due to prior treatment She started to receive IVIG treatment in the prophylactic fashion since 2013 She is doing well while on treatment with premedications monthly The plan will be to continue monthly IVIG at 1 g/kg indefinitely I will see her in 6 months for further follow-up Thrombocytopenia The cause is unknown, could be due to consumption or treatment related It is mild and there is little change compared from previous platelet count.  She is asymptomatic from the thrombocytopenia. I will observe for now.  Preventive measure We spent some time reviewing vaccination program We  discussed the importance of preventive care and reviewed the vaccination programs. She does not have any prior allergic reactions to influenza vaccination. She agrees to proceed with influenza vaccination today and we will administer it today at the clinic.   No orders of the defined types were placed in this encounter.    Hicks Lonn, MD  INTERVAL HISTORY: she returns for treatment follow-up She denies serum sickness or other complications from recent IVIG treatment  PHYSICAL EXAMINATION: ECOG PERFORMANCE STATUS: 1 - Symptomatic but completely ambulatory  No results found for: CAN125    Latest Ref Rng & Units 06/14/2024    9:08 AM 12/01/2023    7:25 AM 06/16/2023    9:19 AM  CBC  WBC 4.0 - 10.5 K/uL 6.0  5.0  5.3   Hemoglobin 12.0 - 15.0 g/dL 85.3  84.5  84.6   Hematocrit 36.0 - 46.0 % 43.3  46.3  45.8   Platelets 150 - 400 K/uL 120  107  151       Chemistry      Component Value Date/Time   NA 145 05/09/2022 0815   NA 142 06/01/2017 0756   K 4.0 05/09/2022 0815   K 4.2 06/01/2017 0756   CL 111 05/09/2022 0815   CL 109 (H) 01/20/2013 0801   CO2 27 05/09/2022 0815   CO2 21 (L) 06/01/2017 0756   BUN 19 05/09/2022 0815   BUN 14.5 06/01/2017 0756   CREATININE 0.88 05/09/2022 0815   CREATININE 1.0 06/01/2017 0756   GLU 92 06/17/2013 0000      Component Value Date/Time   CALCIUM  9.8 05/09/2022 0815   CALCIUM  10.1 06/01/2017 0756   ALKPHOS 43 05/09/2022 0815   ALKPHOS 52 06/01/2017 0756   AST 37 05/09/2022 0815  AST 35 (H) 06/01/2017 0756   ALT 39 05/09/2022 0815   ALT 28 06/01/2017 0756   BILITOT 1.3 (H) 05/09/2022 0815   BILITOT 0.58 06/01/2017 0756       Vitals:   06/14/24 0917  BP: (!) 160/88  Pulse: 84  Resp: 18  Temp: 98.9 F (37.2 C)  SpO2: 95%   Filed Weights   06/14/24 0917  Weight: 183 lb 3.2 oz (83.1 kg)   Other relevant data reviewed during this visit included CBC

## 2024-06-14 NOTE — Progress Notes (Signed)
 D5W KVO order added to IVIG tx plan for subsequent infusions per nursing request.  Nuriyah Hanline, PharmD, MBA

## 2024-06-16 ENCOUNTER — Encounter: Payer: Self-pay | Admitting: Hematology and Oncology

## 2024-07-11 ENCOUNTER — Inpatient Hospital Stay: Attending: Hematology and Oncology

## 2024-07-11 VITALS — BP 154/81 | HR 68 | Temp 98.0°F | Resp 16 | Wt 183.5 lb

## 2024-07-11 DIAGNOSIS — D801 Nonfamilial hypogammaglobulinemia: Secondary | ICD-10-CM | POA: Diagnosis present

## 2024-07-11 MED ORDER — METHYLPREDNISOLONE SODIUM SUCC 125 MG IJ SOLR
40.0000 mg | Freq: Once | INTRAMUSCULAR | Status: AC
  Administered 2024-07-11: 40 mg via INTRAVENOUS
  Filled 2024-07-11: qty 2

## 2024-07-11 MED ORDER — IMMUNE GLOBULIN (HUMAN) 10 GM/100ML IV SOLN
80.0000 g | Freq: Once | INTRAVENOUS | Status: AC
  Administered 2024-07-11: 80 g via INTRAVENOUS
  Filled 2024-07-11: qty 800

## 2024-07-11 MED ORDER — DEXTROSE 5 % IV SOLN: INTRAVENOUS | Status: DC

## 2024-07-11 MED ORDER — ACETAMINOPHEN 325 MG PO TABS
650.0000 mg | ORAL_TABLET | Freq: Once | ORAL | Status: AC
  Administered 2024-07-11: 650 mg via ORAL
  Filled 2024-07-11: qty 2

## 2024-07-11 MED ORDER — LORATADINE 10 MG PO TABS
10.0000 mg | ORAL_TABLET | Freq: Once | ORAL | Status: AC
  Administered 2024-07-11: 10 mg via ORAL
  Filled 2024-07-11: qty 1

## 2024-07-11 NOTE — Patient Instructions (Signed)

## 2024-07-11 NOTE — Progress Notes (Signed)
 Pt declined to stay for 30 min observation period. Tolerated well. AVS declined. Pt ambulatory to lobby for d/c.

## 2024-08-08 ENCOUNTER — Inpatient Hospital Stay: Attending: Hematology and Oncology

## 2024-08-08 VITALS — BP 155/79 | HR 77 | Temp 98.0°F | Resp 16 | Wt 188.0 lb

## 2024-08-08 DIAGNOSIS — D801 Nonfamilial hypogammaglobulinemia: Secondary | ICD-10-CM | POA: Diagnosis present

## 2024-08-08 MED ORDER — LORATADINE 10 MG PO TABS
10.0000 mg | ORAL_TABLET | Freq: Once | ORAL | Status: AC
  Administered 2024-08-08: 08:00:00 10 mg via ORAL
  Filled 2024-08-08: qty 1

## 2024-08-08 MED ORDER — DEXTROSE 5 % IV SOLN: INTRAVENOUS | Status: DC

## 2024-08-08 MED ORDER — METHYLPREDNISOLONE SODIUM SUCC 125 MG IJ SOLR
40.0000 mg | Freq: Once | INTRAMUSCULAR | Status: AC
  Administered 2024-08-08: 08:00:00 40 mg via INTRAVENOUS
  Filled 2024-08-08: qty 2

## 2024-08-08 MED ORDER — ACETAMINOPHEN 325 MG PO TABS
650.0000 mg | ORAL_TABLET | Freq: Once | ORAL | Status: AC
  Administered 2024-08-08: 08:00:00 650 mg via ORAL
  Filled 2024-08-08: qty 2

## 2024-08-08 MED ORDER — IMMUNE GLOBULIN (HUMAN) 10 GM/100ML IV SOLN
80.0000 g | Freq: Once | INTRAVENOUS | Status: AC
  Administered 2024-08-08: 09:00:00 80 g via INTRAVENOUS
  Filled 2024-08-08: qty 400

## 2024-08-08 NOTE — Patient Instructions (Signed)

## 2024-08-08 NOTE — Progress Notes (Signed)
 Pt declined 30 min post IVIG observation period. Tolerated infusion well. No adverse s/s. VSS at time of d/c. Pt ambulatory to lobby for d/c. AVS declined.

## 2024-09-05 ENCOUNTER — Inpatient Hospital Stay: Attending: Hematology and Oncology

## 2024-09-05 VITALS — BP 145/74 | HR 75 | Temp 97.8°F | Resp 18

## 2024-09-05 DIAGNOSIS — D801 Nonfamilial hypogammaglobulinemia: Secondary | ICD-10-CM | POA: Insufficient documentation

## 2024-09-05 MED ORDER — DEXTROSE 5 % IV SOLN: INTRAVENOUS | Status: DC

## 2024-09-05 MED ORDER — IMMUNE GLOBULIN (HUMAN) 10 GM/100ML IV SOLN
80.0000 g | Freq: Once | INTRAVENOUS | Status: AC
  Administered 2024-09-05: 80 g via INTRAVENOUS
  Filled 2024-09-05: qty 400

## 2024-09-05 MED ORDER — METHYLPREDNISOLONE SODIUM SUCC 125 MG IJ SOLR
40.0000 mg | Freq: Once | INTRAMUSCULAR | Status: AC
  Administered 2024-09-05: 40 mg via INTRAVENOUS
  Filled 2024-09-05: qty 2

## 2024-09-05 MED ORDER — ACETAMINOPHEN 325 MG PO TABS
650.0000 mg | ORAL_TABLET | Freq: Once | ORAL | Status: AC
  Administered 2024-09-05: 650 mg via ORAL
  Filled 2024-09-05: qty 2

## 2024-09-05 MED ORDER — LORATADINE 10 MG PO TABS
10.0000 mg | ORAL_TABLET | Freq: Once | ORAL | Status: AC
  Administered 2024-09-05: 10 mg via ORAL
  Filled 2024-09-05: qty 1

## 2024-09-05 NOTE — Patient Instructions (Signed)
 Immune Globulin  Injection What is this medication? IMMUNE GLOBULIN  (im MUNE GLOB yoo lin) treats many immune system conditions. It works by Designer, multimedia extra antibodies. Antibodies are proteins made by the immune system that help protect the body. This medicine may be used for other purposes; ask your health care provider or pharmacist if you have questions. COMMON BRAND NAME(S): ASCENIV, Baygam, BIVIGAM, Carimune, Carimune NF, cutaquig, Cuvitru, Flebogamma, Flebogamma DIF, GamaSTAN, GamaSTAN S/D, Gamimune N, Gammagard, Gammagard S/D, Gammaked, Gammaplex, Gammar-P IV, Gamunex, Gamunex-C, Hizentra, Iveegam, Iveegam EN, Octagam, Panglobulin, Panglobulin NF, panzyga, Polygam S/D, Privigen , Sandoglobulin, Venoglobulin-S, Vigam, Vivaglobulin, Xembify What should I tell my care team before I take this medication? They need to know if you have any of these conditions: Blood clotting disorder Condition where you have excess fluid in your body, such as heart failure or edema Dehydration Diabetes Have had blood clots Heart disease Immune system conditions Kidney disease Low levels of IgA Recent or upcoming vaccine An unusual or allergic reaction to immune globulin , other medications, foods, dyes, or preservatives Pregnant or trying to get pregnant Breastfeeding How should I use this medication? This medication is infused into a vein or under the skin. It may also be injected into a muscle. It is usually given by your care team in a hospital or clinic setting. It may also be given at home. If you get this medication at home, you will be taught how to prepare and give it. Take it as directed on the prescription label. Keep taking it unless your care team tells you to stop. It is important that you put your used needles and syringes in a special sharps container. Do not put them in a trash can. If you do not have a sharps container, call your pharmacist or care team to get one. Talk to your care team  about the use of this medication in children. While it may be given to children for selected conditions, precautions do apply. Overdosage: If you think you have taken too much of this medicine contact a poison control center or emergency room at once. NOTE: This medicine is only for you. Do not share this medicine with others. What if I miss a dose? If you get this medication at the hospital or clinic: It is important not to miss your dose. Call your care team if you are unable to keep an appointment. If you give yourself this medication at home: If you miss a dose, take it as soon as you can. Then continue your normal schedule. If it is almost time for your next dose, take only that dose. Do not take double or extra doses. Call your care team with questions. What may interact with this medication? Live virus vaccines This list may not describe all possible interactions. Give your health care provider a list of all the medicines, herbs, non-prescription drugs, or dietary supplements you use. Also tell them if you smoke, drink alcohol , or use illegal drugs. Some items may interact with your medicine. What should I watch for while using this medication? Your condition will be monitored carefully while you are receiving this medication. Tell your care team if your symptoms do not start to get better or if they get worse. You may need blood work done while you are taking this medication. This medication increases the risk of blood clots. People with heart, blood vessel, or blood clotting conditions are more likely to develop a blood clot. Other risk factors include advanced age, estrogen use, tobacco  use, lack of movement, and being overweight. This medication can decrease the response to a vaccine. If you need to get vaccinated, tell your care team if you have received this medication within the last year. Extra booster doses may be needed. Talk to your care team to see if a different vaccination schedule  is needed. This medication is made from donated human blood. There is a small risk it may contain bacteria or viruses, such as hepatitis or HIV. All products are processed to kill most bacteria and viruses. Talk to your care team if you have questions about the risk of infection. If you have diabetes, talk to your care team about which device you should use to check your blood sugar. This medication may cause some devices to report falsely high blood sugar levels. This may cause you to react by not treating a low blood sugar level or by giving an insulin  dose that was not needed. This can cause severe low blood sugar levels. What side effects may I notice from receiving this medication? Side effects that you should report to your care team as soon as possible: Allergic reactions--skin rash, itching, hives, swelling of the face, lips, tongue, or throat Blood clot--pain, swelling, or warmth in the leg, shortness of breath, chest pain Fever, neck pain or stiffness, sensitivity to light, headache, nausea, vomiting, confusion, which may be signs of meningitis Hemolytic anemia--unusual weakness or fatigue, dizziness, headache, trouble breathing, dark urine, yellowing skin or eyes Kidney injury--decrease in the amount of urine, swelling of the ankles, hands, or feet Low sodium level--muscle weakness, fatigue, dizziness, headache, confusion Shortness of breath or trouble breathing, cough, unusual weakness or fatigue, blue skin or lips Side effects that usually do not require medical attention (report these to your care team if they continue or are bothersome): Chills Diarrhea Fever Headache Nausea This list may not describe all possible side effects. Call your doctor for medical advice about side effects. You may report side effects to FDA at 1-800-FDA-1088. Where should I keep my medication? Keep out of the reach of children and pets. You will be instructed on how to store this medication. Get rid of  any unused medication after the expiration date. To get rid of medications that are no longer needed or have expired: Take the medication to a medication take-back program. Check with your pharmacy or law enforcement to find a location. If you cannot return the medication, ask your pharmacist or care team how to get rid of this medication safely. NOTE: This sheet is a summary. It may not cover all possible information. If you have questions about this medicine, talk to your doctor, pharmacist, or health care provider.  2025 Elsevier/Gold Standard (2023-10-26 00:00:00)

## 2024-10-03 ENCOUNTER — Inpatient Hospital Stay: Attending: Hematology and Oncology

## 2024-10-31 ENCOUNTER — Inpatient Hospital Stay: Attending: Hematology and Oncology

## 2024-11-29 ENCOUNTER — Inpatient Hospital Stay

## 2024-11-29 ENCOUNTER — Inpatient Hospital Stay: Attending: Hematology and Oncology

## 2024-11-29 ENCOUNTER — Inpatient Hospital Stay: Admitting: Hematology and Oncology
